# Patient Record
Sex: Male | Born: 1955 | Race: White | Hispanic: No | Marital: Married | Smoking: Never smoker
Health system: Southern US, Community
[De-identification: ages and names within clinical notes are randomized; demographics above are authoritative.]

## PROBLEM LIST (undated history)

## (undated) DIAGNOSIS — I209 Angina pectoris, unspecified: Secondary | ICD-10-CM

## (undated) DIAGNOSIS — F439 Reaction to severe stress, unspecified: Secondary | ICD-10-CM

## (undated) DIAGNOSIS — G8929 Other chronic pain: Secondary | ICD-10-CM

## (undated) DIAGNOSIS — K219 Gastro-esophageal reflux disease without esophagitis: Secondary | ICD-10-CM

## (undated) DIAGNOSIS — R569 Unspecified convulsions: Principal | ICD-10-CM

## (undated) DIAGNOSIS — M545 Low back pain, unspecified: Secondary | ICD-10-CM

## (undated) DIAGNOSIS — A0472 Enterocolitis due to Clostridium difficile, not specified as recurrent: Secondary | ICD-10-CM

## (undated) DIAGNOSIS — T8859XA Other complications of anesthesia, initial encounter: Secondary | ICD-10-CM

## (undated) DIAGNOSIS — M1990 Unspecified osteoarthritis, unspecified site: Secondary | ICD-10-CM

## (undated) DIAGNOSIS — G2581 Restless legs syndrome: Secondary | ICD-10-CM

## (undated) DIAGNOSIS — T4145XA Adverse effect of unspecified anesthetic, initial encounter: Secondary | ICD-10-CM

## (undated) HISTORY — DX: Restless legs syndrome: G25.81

## (undated) HISTORY — PX: CARDIAC CATHETERIZATION: SHX172

## (undated) HISTORY — DX: Unspecified convulsions: R56.9

---

## 1988-11-19 HISTORY — PX: MULTIPLE TOOTH EXTRACTIONS: SHX2053

## 1997-08-07 ENCOUNTER — Emergency Department (HOSPITAL_COMMUNITY): Admission: EM | Admit: 1997-08-07 | Discharge: 1997-08-07 | Payer: Self-pay | Admitting: Emergency Medicine

## 2001-02-05 ENCOUNTER — Emergency Department (HOSPITAL_COMMUNITY): Admission: EM | Admit: 2001-02-05 | Discharge: 2001-02-05 | Payer: Self-pay | Admitting: Emergency Medicine

## 2001-02-05 ENCOUNTER — Encounter: Payer: Self-pay | Admitting: Emergency Medicine

## 2007-02-22 ENCOUNTER — Observation Stay (HOSPITAL_COMMUNITY): Admission: EM | Admit: 2007-02-22 | Discharge: 2007-02-23 | Payer: Self-pay | Admitting: Emergency Medicine

## 2008-02-14 ENCOUNTER — Ambulatory Visit: Payer: Self-pay | Admitting: Psychiatry

## 2008-02-14 ENCOUNTER — Emergency Department (HOSPITAL_COMMUNITY): Admission: EM | Admit: 2008-02-14 | Discharge: 2008-02-14 | Payer: Self-pay | Admitting: Emergency Medicine

## 2008-02-14 ENCOUNTER — Inpatient Hospital Stay (HOSPITAL_COMMUNITY): Admission: EM | Admit: 2008-02-14 | Discharge: 2008-02-19 | Payer: Self-pay | Admitting: Psychiatry

## 2008-02-20 ENCOUNTER — Other Ambulatory Visit (HOSPITAL_COMMUNITY): Admission: RE | Admit: 2008-02-20 | Discharge: 2008-03-05 | Payer: Self-pay | Admitting: Psychiatry

## 2008-02-28 ENCOUNTER — Ambulatory Visit: Payer: Self-pay | Admitting: Psychiatry

## 2008-03-05 ENCOUNTER — Emergency Department (HOSPITAL_COMMUNITY): Admission: EM | Admit: 2008-03-05 | Discharge: 2008-03-05 | Payer: Self-pay | Admitting: Emergency Medicine

## 2008-03-11 ENCOUNTER — Inpatient Hospital Stay (HOSPITAL_COMMUNITY): Admission: EM | Admit: 2008-03-11 | Discharge: 2008-03-12 | Payer: Self-pay | Admitting: Emergency Medicine

## 2008-03-18 ENCOUNTER — Emergency Department (HOSPITAL_COMMUNITY): Admission: EM | Admit: 2008-03-18 | Discharge: 2008-03-18 | Payer: Self-pay | Admitting: Family Medicine

## 2008-04-08 ENCOUNTER — Ambulatory Visit (HOSPITAL_COMMUNITY): Payer: Self-pay | Admitting: Psychiatry

## 2008-05-13 ENCOUNTER — Ambulatory Visit (HOSPITAL_COMMUNITY): Payer: Self-pay | Admitting: Psychiatry

## 2008-07-02 ENCOUNTER — Ambulatory Visit (HOSPITAL_COMMUNITY): Payer: Self-pay | Admitting: Psychiatry

## 2010-08-03 NOTE — Discharge Summary (Signed)
Walter Holder, Walter Holder NO.:  [id number]   MEDICAL RECORD NO.:  [id number]          PATIENT TYPE:  INP   LOCATION:  3731                         FACILITY:  [facility]   PHYSICIAN:  Sheliah Mends, MD      DATE OF BIRTH:  1955/10/26   DATE OF ADMISSION:  03/11/2008  DATE OF DISCHARGE:  03/12/2008                               DISCHARGE SUMMARY   DISCHARGE DIAGNOSES:  1. Chest pain not coronary ischemia status post cardiac      catheterization this admission with essentially normal coronary      arteries.  He had only had like a 10-20% ostial diagonal 1      stenosis, his left circumflex was normal, right coronary artery was      normal, left anterior descending was normal and his left main was      normal.  Cardiac catheterization was performed by Dr. Sheliah Mends      and Dr. Nanetta Batty on March 12, 2008.  2. Anxiety/depression.  3. Recent detoxification for Xanax use.  4. Positive premature family history of coronary artery disease.  5. History of back pain.  6. Dyslipidemia.   LABORATORY DATA:  Chest x-ray shows suboptimal inspiration, no active  disease.  Lipid profile; total cholesterol 161, triglycerides 168, HDL  30, LDL 97.  CBC; hemoglobin 14.5, hematocrit of 42.5, WBCs 10.8 and  platelets 215.  TSH was 0.672.  CK-MBs and troponins were all negative.  Magnesium was 1.9.  Drug screen was negative.   DISCHARGE MEDICATIONS:  1. Trazodone 100 mg nightly.  2. Seroquel 100 mg nightly.  3. Pepcid 20 mg twice per day.  He can buy that over-the-counter and      he should take it for 2 weeks and then take 1 daily.  4. Aspirin 81 mg once a day.   He was also told that he did not need to return to a cardiologist now  with his essentially normal coronary arteries, he can follow up with his  primary care doctor if he has more chest pain.  He should call our  office if he has any problems with his groin.  He was told not to do any  strenuous activity of pushing,  pulling, lifting or extended walking for  1 week.  He was told no driving for 1 day.  He should drink plenty of  fluids today and tomorrow.  He should increase his fish, vegetables and  fiber and decrease his beef, cheese  and simple carbohydrates because of  his lipid profile.   HOSPITAL COURSE:  Walter Holder is a 55 year old male with no prior history  of coronary artery disease who developed some chest pain, dizziness,  pressure 5/10 went to his back and his left arm felt numb.  He had some  shortness of breath, diaphoresis but no nausea. He had never had a  similar episode before.  He was subsequently admitted and ruled out for  an MI. Walter Holder was scheduled for cardiac catheterization.  He does  have premature family history of coronary artery disease and  dyslipidemia.  Cardiac catheterization was done on March 12, 2008 by  Dr. Sheliah Mends and Dr. Nanetta Batty.  He had essentially normal  coronary arteries with only diagonal 1 that was 10-20%.  He had normal  LV function.  On admission, he was placed on lisinopril and Lopressor.  His blood  pressure was about 100 mmHg systolic and dropped down to 89-87.  He was  given an extra 500 mg bolus of fluid.  At this time, his discharge is  pending and getting up out of bed post cath.  If he walks in the halls,  if his blood pressure does not drop and he remains asymptomatic, he may  be discharged home.      Lezlie Octave, N.P.      Sheliah Mends, MD  Electronically Signed    BB/MEDQ  D:  03/12/2008  T:  03/13/2008  Job:  [id number]   cc:   Barry Dienes. Eloise Harman, M.D.  Geoffery Lyons, M.D.

## 2010-08-03 NOTE — H&P (Signed)
NAMEEOIN, WILLDEN NO.:  [id number]   MEDICAL RECORD NO.:  [id number]          PATIENT TYPE:  INP   LOCATION:  1317                         FACILITY:  [facility]   PHYSICIAN:  Tera Mater. [place], M.D. DATE OF BIRTH:  [birth year]   DATE OF ADMISSION:  02/22/2007  DATE OF DISCHARGE:                              HISTORY & PHYSICAL   Mr. Powell is a 55 year old healthy white male with a history of kidney  stones, colon polyps, and basically nothing else who presents for  evaluation.  Roughly two days prior to admission he started feeling  malaise and churning in his stomach.  He had one episode of vomiting,  but then this was followed by 8-10 bowel movements that were loose,  watery, and quite explosive.  He notices there was some darkness in  these stools, but saw no frank blood.  He had minor abdominal cramping.  He had weakness in his legs.  He had dizziness when he stood up and  basically presented to the emergency room for uncontrollable symptoms.  He did have hot and cold episodes but no measured fever.  His p.o.  intake the day prior to admission was fairly minimal.  He has had no  episodes like this.  No one around him has been sick.  He has had no  unusual travel.  His exposures, however, include eating a lot of wild  game.  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  He also has recently killed some  deer, and does eat wild game fairly regularly.  He also reports intake  of some cold dressing and a BLT at a friend's house in the last week.  He has not been out of the country.  He has not had any camping exposure  to unpurified water.  No one in the family has been sick.  He has had  nothing like this before.   REVIEW OF SYSTEMS:  GENERAL:  He has felt well.  His weight has been  stable.  His bowel habits have been normal.  Prior to this he has had no  abdominal complaints or other difficulties.   PAST MEDICAL  HISTORY:  Kidney stones.  He has had colon polyps with a  colonoscopy in 2008 in June.  He has had a back fracture and lumbar  puncture associated with that apparently.   MEDICATIONS:  Only some over-the-counter Tylenol and fish oil.   FAMILY HISTORY:  Negative for any bowel cancers or inflammatory bowel  disease.   SOCIAL HISTORY:  He is married.  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   PHYSICAL EXAMINATION:  GENERAL:  A fairly healthy-appearing white male  sitting up in no distress.  VITAL SIGNS:  Initial blood pressure 126/71, pulse 102, respirations 20,  temperature 99.1, O2 sats 98%.  HEENT:  Sclerae anicteric.  Extraocular movements intact.  Face is  symmetrical.  Mucous membranes are moist.  There are no oral lesions.  NECK:  Supple without JVD  or thyromegaly.  LUNGS:  Clear without wheezes, rales or rhonchi.  HEART:  Regular without murmurs, rubs or gallops.  ABDOMEN:  Mildly distended.  Does have some hyperactive bowel sounds.  No real rushes or tinkles are heard.  There is no area of tenderness,  pulsations or masses.  EXTREMITIES:  Reveal strong distal pulses without edema.  RECTAL:  Done in the ER did show heme positive stool.  It was dark  looking stool.  NEUROLOGICAL:  The patient is awake, alert.  His mentation is excellent.  Speech is clear.  No tremors present.  No weakness of note is present.   LABORATORY DATA:  Laboratory data at presentation:  White count 19,000,  hemoglobin 15.5, platelets 171,000, 79% neutrophils, 12% lymphocytes, 8%  monocytes, 0% eosinophils.  Sodium was 135, potassium 3.6, chloride 104,  CO2 23, BUN 15, creatinine 0.91, glucose 94.  Bilirubin 1.3.  SGOT 18,  SGPT 17.  Total protein 6.3.  Albumin 3.3.  Calcium 8.8.  This morning  white count 15,800, hemoglobin 13.2, platelets 144,000.  Liver function  testing is normal this morning.  Albumin has dropped to 2.8.  Sodium  136, potassium 3.3, chloride 105, CO2 25, BUN  10, creatinine 0.89,  glucose 98.   RADIOLOGY STUDIES:  Radiology studies of abdomen and pelvis.  Chest x-  ray showed left lung and left basilar subsegmental atelectasis.  Flat  and upright abdomen __________  colonic air-fluid levels without  evidence of destruction.  They were felt to be abnormal frequently  associated with infection and a few scattered small bowel levels are  noted.  No dilated loops of large or small bowel are noted.  This is  felt to be seen with gastroenteritis.   SUMMARY:  We have a 55 year old white male presenting with an acute GI  presentation with diarrhea and mildly heme-positive stools.  I do not  suspect there is any significant GI bleeding based on his hemodynamic  status as well as his stable hemoglobin.  I suspect more likely he has  some irritation from the multiple diarrheal episodes.  The acute onset  of this is most likely viral, and what we have here is probably a self-  limited disease.  There is, however, possibility of Clostridium  difficile, although he does not have any of the routine risk factors for  this.  He has been treated with both vancomycin IV and Flagyl p.o.  He  is clinically less toxic and actually has had no bowel movements  overnight.  We are going to see how he does during the day with  advancing the diet.  Hopefully he can be discharged fairly promptly.  I  am pleased his white count is already cut in half.  I do not suspect  inflammatory bowel disease as a primary onset at this age.  I also do  not suspect any parasites or other unusual things related to his wild  game exposure.           ______________________________  Tera Mater. Evlyn Kanner, M.D.     SAS/MEDQ  D:  02/22/2007  T:  02/22/2007  Job:  [id number]

## 2010-08-03 NOTE — Cardiovascular Report (Signed)
NAMEBERTRAND, VOWELS NO.:  [id number]   MEDICAL RECORD NO.:  [id number]          PATIENT TYPE:  INP   LOCATION:  3731                         FACILITY:  [facility]   PHYSICIAN:  Sheliah Mends, MD      DATE OF BIRTH:  [birth year]   DATE OF PROCEDURE:  DATE OF DISCHARGE:  03/12/2008                            CARDIAC CATHETERIZATION   CARDIOLOGISTS:  Nanetta Batty, MD and Sheliah Mends, MD   INDICATION:  This is a 55 year old gentleman who presented with  recurrent episodes of chest pain responding to nitroglycerine.  The  patient was started on the ACS protocol including heparin,  nitroglycerin, beta-blocker, metoprolol, and Crestor.  His chest pain  subsequently improved and he agreed to proceed with cardiac  catheterization.   PROCEDURE:  After informed consent was obtained, the patient was brought  to the second floor Cardiac Cath Lab.  Access was obtained for the right  femoral artery using local anesthesia with 1% lidocaine.  The patient  was started on conscious sedation with Versed and fentanyl.  Selective  coronary angiography was performed using the Cordis Infiniti and 6  French catheters.   FINDINGS:  Selective coronary angiography revealed a medium-sized, large  caliber left main artery that bifurcates into the LAD and left  circumflex artery.  The left main artery is free of disease.  The LAD is  a medium caliber, large-sized vessel that wraps around the apex.  It  gives off a large, high first diagonal branch and a subsequent smaller  diagonal branch in the medium segment.  The LAD is free of  angiographically significant disease.  The first diagonal artery has a  mild, 20% lesion at the osteum without hemodynamic significance.  The  second diagonal branch was free of disease.  The left circumflex artery  is the large-sized vessel that gives off 2 small OM branches.  The left  circumflex artery is free of disease.   The right coronary artery disease  is the medium-sized vessel that gives  of PDA.  The RCA has no angiographically significant disease.   HEMODYNAMICS:  Aortic pressure 100/76, left ventricular pressure 104/3.   LV gram shows mildly reduced left ventricular function with an estimated  ejection fraction of 50%.   The procedure was completed without complications.   RECOMMENDATIONS:  Coronary angiography did not reveal angiographically  significant coronary artery disease.  The patient was found to have mild  coronary artery disease in the first diagonal branch.  He should be  continued on aggressive risk factor modification.      Sheliah Mends, MD  Electronically Signed     JE/MEDQ  D:  03/12/2008  T:  03/12/2008  Job:  [id number]   cc:   Nanetta Batty, M.D.

## 2010-08-06 NOTE — Discharge Summary (Signed)
NAMEMCIHAEL, HINDERMAN NO.:  [id number]   MEDICAL RECORD NO.:  [id number]          PATIENT TYPE:  IPS   LOCATION:  0300                          FACILITY:  [facility]   PHYSICIAN:  Geoffery Lyons, M.D.      DATE OF BIRTH:  27-Feb-1956   DATE OF ADMISSION:  02/14/2008  DATE OF DISCHARGE:  02/19/2008                               DISCHARGE SUMMARY   CHIEF COMPLAINT AND PRESENT ILLNESS:  This was the first admission to  [facility] for this 55 year old white male voluntarily  admitted.  Endorsed he could not sleep, most nights gets an hour, and  then he keeps waking up, sometimes can get up to 3 hours.  Sleep has  been very poor for the last 10 days, worried about his thinking, has  been on opiates since falling and suffering a back injury, had been on  Xanax taking 2 mg daily, Percocet 4 tabs daily.   PAST PSYCHIATRIC HISTORY:  First time at inpatient.  No previous  treatment.   ALCOHOL AND DRUG HISTORY:  Taking Xanax and Percocet as prescribed.   PHYSICAL EXAM:  Failed to show any acute findings.   LABORATORY WORKUP:  UDS positive for benzodiazepines.  CBC and blood  chemistry within normal limits.   MENTAL STATUS EXAM:  Reveals an alert, cooperative male.  Mood  depressed.  Affect depressed.  Thought processes logical, coherent, and  relevant.  Endorsed feeling pretty overwhelmed and not able to sleep,  anxious, requesting help.   ADMITTING DIAGNOSES:  AXIS I:  1. Rule out benzodiazepine opiate dependence.  2. Depressive disorder, not otherwise specified.  AXIS II:  No diagnosis.  AXIS III:  Chronic back pain.  AXIS IV:  Moderate.  AXIS V:  Upon admission 35 to 40, highest Global Assessment of  Functioning in the last year 60.   COURSE IN THE HOSPITAL:  He was admitted.  We started detoxing with  clonidine and Librium.  He was given some Seroquel and trazodone.  As  already stated, 55 year old male married who endorsed that 6 years ago  he fell  and crushed his back.  He was placed on opiates, was trying to  get off pain pills as he is buying them off the street, usually Percocet  10 up to 4, sometimes more.  When he is not using the Percocet, he is  using the Xanax.  He was prescribed 0.5.  He used up to 4 a day, last  used Tuesday, some withdrawal, decreased sleep, aches, worries, feeling  out of control.  Afraid that he would hurt himself.  Denies depression,  mostly anxiety.  February 16, 2008, he was better.  Sleep started  improving.  Able to open up and talk about the conflicted relationship  with his daughter, start working on coping skills.  February 18, 2008,  endorsed he started to sleep better.  Worried about the stressor when he  got out of the hospital, his ability to sleep without the  benzodiazepines or the opiates.  He had some sweating, increased heart  rate, willing to  put up with this if the medications were going to help  him.  We continued the trazodone and the Seroquel with coping skills.  There was a family session February 19, 2008.  Wife talked about her own  stress in terms of [place], felt  that their roles have reverted.  The husband is self-employed so he  works at home, mostly does house work.  They identified the daughter as  being a big part of the stress.  They were planning to set some limits  on her.  The wife felt that the daughter was bipolar.  February 19, 2008,  he was in full contact with reality.  There were no active suicidal or  homicidal ideas.  No hallucinations or delusions.  He was willing and  motivated to pursue outpatient treatment and continued to abstain from  these substances.   DISCHARGE DIAGNOSES:  AXIS I:  1. Benzodiazepine dependence.  2. Opiate dependence.  3. Anxiety disorder, not otherwise specified.  AXIS II:  No diagnosis.  AXIS III:  Chronic back pain.  AXIS IV:  Moderate.  AXIS V:  Upon discharge 50 to 55.   Discharged on:   1. Seroquel 100 at bedtime.  2. Trazodone 100 at bedtime.   FOLLOWUP:  [facility].      Geoffery Lyons, M.D.  Electronically Signed     IL/MEDQ  D:  03/19/2008  T:  03/20/2008  Job:  [id number]

## 2010-08-06 NOTE — Discharge Summary (Signed)
Walter Holder, Walter Holder NO.:  [id number]   MEDICAL RECORD NO.:  [id number]          PATIENT TYPE:  INP   LOCATION:  1317                         FACILITY:  [facility]   PHYSICIAN:  Barry Dienes. Eloise Harman, M.D.DATE OF BIRTH:  Nov 16, 1955   DATE OF ADMISSION:  02/21/2007  DATE OF DISCHARGE:  02/23/2007                               DISCHARGE SUMMARY   PERTINENT FINDINGS:  The patient is a 55 year old healthy white man with  a past medical history significant for kidney stones, colon polyps, who  presented with two days of malaise with one episode of vomiting and  moderately severe diarrhea with 8-10 brown bowel movements per day.  Some of the bowel movements were black in color.  He denied nausea or  gross rectal bleeding.  He had mild lightheadedness when standing and  denied significant fever.  Approximately 3 weeks prior to admission he  had antibiotics with dental work.  He hunts and has eaten wild game  recently.  He has not had any recent food outside at home.   INITIAL PHYSICAL EXAMINATION:  GENERAL:  He is a healthy-appearing white  male who is sitting up in no apparent distress.  VITAL SIGNS:  Blood pressure 126/71, pulse 102, respirations 20,  temperature 99.1, pulse oxygen saturation 98% on room air.  HEENT:  Exam was within normal limits.  NECK:  Supple without jugular venous distention or carotid bruit.  CHEST:  Clear to auscultation.  HEART:  Regular rate and rhythm without significant murmur or gallop.  ABDOMEN:  Mildly distended.  Bowel sounds were hypoactive, and there  were no areas of tenderness and no hepatosplenomegaly.  EXTREMITIES:  Without cyanosis, clubbing, or edema, and the pedal pulses  were normal.  RECTAL:  Exam done by emergency room staff showed heme-positive stool.  NEUROLOGIC:  He is alert and well oriented with no focal deficits.   INITIAL LABORATORY STUDIES:  White blood cells 19 with 79% neutrophils,  12% lymphocytes, 8% monocytes and 0%  eosinophils, hemoglobin 15.5,  platelets 171. Serum sodium 135, potassium 3.6, chloride 104, carbon  dioxide 23, BUN 15, creatinine 0.91, glucose 94, total bilirubin 1.3,  SGOT 18, SGPT 17, total protein 6.3, albumin 3.3, calcium 8.8.   Acute abdominal series x-ray showed the following.  1. No acute cardiopulmonary disease with scattered subsegmental      atelectasis.  2. Nonobstructive bowel gas pattern with scattered large-bowel and      small-bowel air-fluid levels, commonly seen with gastroenteritis.      There were no signs of obstruction   HOSPITAL COURSE:  The patient was admitted to a medical bed without  telemetry.  He was treated with high-dose IV fluids and was started on  Flagyl 500 mg 3 times a day for presumed C.  difficile colitis.  One  stool specimen for C. difficile toxin returned negative.  He rapidly  improved, and by the morning of discharge had no nausea or vomiting and  had only had one loose bowel movement that was brown in color.   CONDITION ON DISCHARGE:  He was eating well with  a lessening of  frequency of diarrhea episodes.  His vital signs at the time of  discharge included blood pressure 122/69, pulse 116, respirations 20,  temperature 99.5.  Chest was clear to auscultation.  Heart had a regular  rate and rhythm.  Abdomen had normal bowel sounds and no tenderness.  Serum sodium was 140, potassium 4.3, BUN 5, creatinine 0.8, glucose 109.  White blood cells 16.9, hemoglobin 13, hematocrit 39, platelets 149.  Stool cultures for enteric pathogens were negative to date.   DISCHARGE DIAGNOSES:  1. Severe diarrhea, possibly due to Clostridium difficile colitis.  2. Dehydration.  3. History of nephrolithiasis.  4. History of colon polyps   DISCHARGE MEDICATIONS:  1. Flagyl 500 mg 1 tablet p.o. 3 times a day for 10 days.  2. Tylenol 500 mg p.o. 4 times a day p.r.n. pain.  3. Gator Ade or Pedialyte, take at least 2 liters daily until the      diarrhea  resolves entirely.  4. Phenergan 25 mg p.o. 3 times a day p.r.n. nausea.   DISPOSITION AND FOLLOWUP:  The patient will was advised to have a  followup evaluation with Dr. Jarome Matin at [facility] 1-2 weeks following discharge.  He was advised to call if his  diarrhea worsens or vomiting occurs.  He was also advised to not have  any alcoholic beverages whatsoever while on Flagyl due to risk of severe  nausea and vomiting.           ______________________________  Barry Dienes. Eloise Harman, M.D.     DGP/MEDQ  D:  02/26/2007  T:  02/26/2007  Job:  [id number]

## 2010-12-21 LAB — BASIC METABOLIC PANEL
Calcium: 9.2
GFR calc non Af Amer: >60
Glucose, Bld: 108 — ABNORMAL HIGH
Potassium: 3.5
Sodium: 136

## 2010-12-21 LAB — URINALYSIS, ROUTINE W REFLEX MICROSCOPIC
Bilirubin Urine: NEGATIVE
Glucose, UA: NEGATIVE
Specific Gravity, Urine: 1.009
pH: 7

## 2010-12-21 LAB — HEPATIC FUNCTION PANEL
AST: 17
Albumin: 4
Alkaline Phosphatase: 49
Total Protein: 7

## 2010-12-21 LAB — RAPID URINE DRUG SCREEN, HOSP PERFORMED
Cocaine: NOT DETECTED
Opiates: NOT DETECTED

## 2010-12-21 LAB — POCT I-STAT, CHEM 8
BUN: 8
Creatinine, Ser: 0.9
Potassium: 3.6
Sodium: 140

## 2010-12-21 LAB — DIFFERENTIAL
Basophils Absolute: 0
Basophils Relative: 0
Lymphocytes Relative: 14
Monocytes Relative: 5
Neutro Abs: 7.9 — ABNORMAL HIGH
Neutrophils Relative %: 81 — ABNORMAL HIGH

## 2010-12-21 LAB — URINE MICROSCOPIC-ADD ON

## 2010-12-21 LAB — ACETAMINOPHEN LEVEL

## 2010-12-21 LAB — CBC
MCHC: 34.1
RBC: 5
WBC: 9.7

## 2010-12-21 LAB — ETHANOL

## 2010-12-24 LAB — DIFFERENTIAL
Basophils Relative: 0 % (ref 0–1)
Eosinophils Absolute: 0 10*3/uL (ref 0.0–0.7)
Monocytes Absolute: 0.8 10*3/uL (ref 0.1–1.0)
Monocytes Relative: 8 % (ref 3–12)

## 2010-12-24 LAB — COMPREHENSIVE METABOLIC PANEL
ALT: 23 U/L (ref 0–53)
AST: 18 U/L (ref 0–37)
Albumin: 3.9 g/dL (ref 3.5–5.2)
Alkaline Phosphatase: 62 U/L (ref 39–117)
BUN: 10 mg/dL (ref 6–23)
Calcium: 10.1 mg/dL (ref 8.4–10.5)
Calcium: 9.2 mg/dL (ref 8.4–10.5)
Chloride: 105 mEq/L (ref 96–112)
Creatinine, Ser: 0.81 mg/dL (ref 0.4–1.5)
GFR calc Af Amer: >60 mL/min (ref >60)
Glucose, Bld: 111 mg/dL — ABNORMAL HIGH (ref 70–99)
Potassium: 3.8 mEq/L (ref 3.5–5.1)
Sodium: 140 mEq/L (ref 135–145)
Total Protein: 7.2 g/dL (ref 6.0–8.3)

## 2010-12-24 LAB — URINE MICROSCOPIC-ADD ON

## 2010-12-24 LAB — URINALYSIS, ROUTINE W REFLEX MICROSCOPIC
Bilirubin Urine: NEGATIVE
Glucose, UA: NEGATIVE mg/dL
Glucose, UA: NEGATIVE mg/dL
Hgb urine dipstick: NEGATIVE
Ketones, ur: 15 mg/dL — AB
Specific Gravity, Urine: 1.015 (ref 1.005–1.030)
pH: 6 (ref 5.0–8.0)
pH: 6.5 (ref 5.0–8.0)

## 2010-12-24 LAB — URINE DRUGS OF ABUSE SCREEN W ALC, ROUTINE (REF LAB)
Barbiturate Quant, Ur: NEGATIVE
Benzodiazepines.: NEGATIVE
Marijuana Metabolite: NEGATIVE
Propoxyphene: NEGATIVE

## 2010-12-24 LAB — RAPID URINE DRUG SCREEN, HOSP PERFORMED
Barbiturates: NOT DETECTED
Benzodiazepines: NOT DETECTED
Benzodiazepines: NOT DETECTED
Cocaine: NOT DETECTED
Cocaine: NOT DETECTED
Opiates: NOT DETECTED

## 2010-12-24 LAB — APTT: aPTT: 29 seconds (ref 24–37)

## 2010-12-24 LAB — LIPID PANEL
HDL: 30 mg/dL — ABNORMAL LOW (ref >39)
Total CHOL/HDL Ratio: 5.4 RATIO
VLDL: 34 mg/dL (ref 0–40)

## 2010-12-24 LAB — CBC
HCT: 42.5 % (ref 39.0–52.0)
HCT: 49 % (ref 39.0–52.0)
Hemoglobin: 14.5 g/dL (ref 13.0–17.0)
Hemoglobin: 17 g/dL (ref 13.0–17.0)
MCHC: 34.1 g/dL (ref 30.0–36.0)
MCHC: 34.6 g/dL (ref 30.0–36.0)
MCV: 92 fL (ref 78.0–100.0)
MCV: 93.1 fL (ref 78.0–100.0)
RBC: 5.32 MIL/uL (ref 4.22–5.81)
RDW: 13.4 % (ref 11.5–15.5)

## 2010-12-24 LAB — POCT I-STAT, CHEM 8
BUN: 10 mg/dL (ref 6–23)
BUN: 6 mg/dL (ref 6–23)
Calcium, Ion: 1.15 mmol/L (ref 1.12–1.32)
Calcium, Ion: 1.16 mmol/L (ref 1.12–1.32)
Chloride: 103 meq/L (ref 96–112)
Creatinine, Ser: 0.8 mg/dL (ref 0.4–1.5)
Creatinine, Ser: 0.9 mg/dL (ref 0.4–1.5)
Glucose, Bld: 103 mg/dL — ABNORMAL HIGH (ref 70–99)
HCT: 52 % (ref 39.0–52.0)
Hemoglobin: 17.7 g/dL — ABNORMAL HIGH (ref 13.0–17.0)
Potassium: 3.8 mEq/L (ref 3.5–5.1)
Sodium: 139 meq/L (ref 135–145)
TCO2: 25 mmol/L (ref 0–100)
TCO2: 26 mmol/L (ref 0–100)

## 2010-12-24 LAB — POCT CARDIAC MARKERS
CKMB, poc: 1 ng/mL (ref 1.0–8.0)
Myoglobin, poc: 92.2 ng/mL (ref 12–200)
Troponin i, poc: <0.05 ng/mL (ref 0.00–0.09)

## 2010-12-24 LAB — PROTIME-INR: INR: 1 (ref 0.00–1.49)

## 2010-12-24 LAB — CK TOTAL AND CKMB (NOT AT ARMC)
CK, MB: 0.8 ng/mL (ref 0.3–4.0)
Relative Index: INVALID (ref 0.0–2.5)

## 2010-12-24 LAB — TSH: TSH: 0.672 u[IU]/mL (ref 0.350–4.500)

## 2010-12-24 LAB — LIPASE, BLOOD: Lipase: 26 U/L (ref 11–59)

## 2010-12-24 LAB — HEPARIN LEVEL (UNFRACTIONATED): Heparin Unfractionated: 0.36 IU/mL (ref 0.30–0.70)

## 2010-12-27 LAB — DIFFERENTIAL
Eosinophils Absolute: 0 — ABNORMAL LOW
Eosinophils Relative: 0
Lymphs Abs: 2.3
Monocytes Relative: 8

## 2010-12-27 LAB — BASIC METABOLIC PANEL
BUN: 10
BUN: 5 — ABNORMAL LOW
CO2: 25
CO2: 25
CO2: 27
Calcium: 8.8
Calcium: 9
Chloride: 105
Chloride: 107
Creatinine, Ser: 0.89
GFR calc Af Amer: >60
GFR calc non Af Amer: >60
Glucose, Bld: 107 — ABNORMAL HIGH
Glucose, Bld: 109 — ABNORMAL HIGH
Glucose, Bld: 98
Potassium: 3.8
Sodium: 140
Sodium: 140

## 2010-12-27 LAB — COMPREHENSIVE METABOLIC PANEL
ALT: 17
AST: 18
CO2: 23
Calcium: 8.8
Chloride: 104
GFR calc Af Amer: >60
GFR calc non Af Amer: >60
Sodium: 135

## 2010-12-27 LAB — CBC
HCT: 38.3 — ABNORMAL LOW
HCT: 38.9 — ABNORMAL LOW
Hemoglobin: 13.4
Hemoglobin: 13.7
MCHC: 34.9
MCHC: 35.2
MCHC: 35.3
MCHC: 35.5
MCV: 89.4
MCV: 89.9
Platelets: 144 — ABNORMAL LOW
Platelets: 149 — ABNORMAL LOW
RBC: 4.26
RBC: 4.9
RDW: 13.5
RDW: 13.8
WBC: 19 — ABNORMAL HIGH

## 2010-12-27 LAB — HEPATIC FUNCTION PANEL
AST: 14
Albumin: 2.8 — ABNORMAL LOW
Total Bilirubin: 1.1
Total Protein: 5.4 — ABNORMAL LOW

## 2010-12-27 LAB — STOOL CULTURE

## 2010-12-27 LAB — CLOSTRIDIUM DIFFICILE EIA: C difficile Toxins A+B, EIA: NEGATIVE

## 2010-12-27 LAB — TYPE AND SCREEN: ABO/RH(D): O NEG

## 2012-02-28 ENCOUNTER — Emergency Department (HOSPITAL_COMMUNITY): Payer: Self-pay

## 2012-02-28 ENCOUNTER — Encounter (HOSPITAL_COMMUNITY): Payer: Self-pay | Admitting: *Deleted

## 2012-02-28 ENCOUNTER — Inpatient Hospital Stay (HOSPITAL_COMMUNITY)
Admission: EM | Admit: 2012-02-28 | Discharge: 2012-02-29 | DRG: 372 | Disposition: A | Discharge status: Home / Self Care | Payer: MEDICAID | Attending: Internal Medicine | Admitting: Internal Medicine

## 2012-02-28 DIAGNOSIS — R51 Headache: Secondary | ICD-10-CM | POA: Diagnosis present

## 2012-02-28 DIAGNOSIS — R000 Tachycardia, unspecified: Secondary | ICD-10-CM | POA: Diagnosis present

## 2012-02-28 DIAGNOSIS — A09 Infectious gastroenteritis and colitis, unspecified: Secondary | ICD-10-CM | POA: Diagnosis present

## 2012-02-28 DIAGNOSIS — M545 Low back pain, unspecified: Secondary | ICD-10-CM | POA: Diagnosis present

## 2012-02-28 DIAGNOSIS — E860 Dehydration: Secondary | ICD-10-CM | POA: Diagnosis present

## 2012-02-28 DIAGNOSIS — R195 Other fecal abnormalities: Secondary | ICD-10-CM | POA: Diagnosis present

## 2012-02-28 DIAGNOSIS — K921 Melena: Secondary | ICD-10-CM | POA: Diagnosis present

## 2012-02-28 DIAGNOSIS — K529 Noninfective gastroenteritis and colitis, unspecified: Secondary | ICD-10-CM

## 2012-02-28 DIAGNOSIS — E876 Hypokalemia: Secondary | ICD-10-CM | POA: Diagnosis present

## 2012-02-28 DIAGNOSIS — A0472 Enterocolitis due to Clostridium difficile, not specified as recurrent: Principal | ICD-10-CM | POA: Diagnosis present

## 2012-02-28 HISTORY — DX: Other chronic pain: G89.29

## 2012-02-28 HISTORY — DX: Gastro-esophageal reflux disease without esophagitis: K21.9

## 2012-02-28 HISTORY — DX: Other complications of anesthesia, initial encounter: T88.59XA

## 2012-02-28 HISTORY — DX: Low back pain: M54.5

## 2012-02-28 HISTORY — DX: Reaction to severe stress, unspecified: F43.9

## 2012-02-28 HISTORY — DX: Angina pectoris, unspecified: I20.9

## 2012-02-28 HISTORY — DX: Enterocolitis due to Clostridium difficile, not specified as recurrent: A04.72

## 2012-02-28 HISTORY — DX: Low back pain, unspecified: M54.50

## 2012-02-28 HISTORY — DX: Adverse effect of unspecified anesthetic, initial encounter: T41.45XA

## 2012-02-28 HISTORY — DX: Unspecified osteoarthritis, unspecified site: M19.90

## 2012-02-28 LAB — CBC WITH DIFFERENTIAL/PLATELET
Basophils Relative: 0 % (ref 0–1)
Eosinophils Absolute: 0 10*3/uL (ref 0.0–0.7)
Eosinophils Absolute: 0 10*3/uL (ref 0.0–0.7)
Eosinophils Relative: 0 % (ref 0–5)
HCT: 37.6 % — ABNORMAL LOW (ref 39.0–52.0)
Hemoglobin: 15 g/dL (ref 13.0–17.0)
Hemoglobin: 12.7 g/dL — ABNORMAL LOW (ref 13.0–17.0)
Lymphs Abs: 1.6 10*3/uL (ref 0.7–4.0)
MCH: 30.5 pg (ref 26.0–34.0)
MCH: 29.7 pg (ref 26.0–34.0)
MCHC: 34.6 g/dL (ref 30.0–36.0)
MCV: 87.9 fL (ref 78.0–100.0)
Monocytes Absolute: 1.6 10*3/uL — ABNORMAL HIGH (ref 0.1–1.0)
Monocytes Relative: 9 % (ref 3–12)
Neutro Abs: 13.3 10*3/uL — ABNORMAL HIGH (ref 1.7–7.7)
Neutrophils Relative %: 84 % — ABNORMAL HIGH (ref 43–77)
Neutrophils Relative %: 82 % — ABNORMAL HIGH (ref 43–77)
Platelets: 215 10*3/uL (ref 150–400)
RBC: 4.91 MIL/uL (ref 4.22–5.81)
RBC: 4.28 MIL/uL (ref 4.22–5.81)

## 2012-02-28 LAB — COMPREHENSIVE METABOLIC PANEL
ALT: 20 U/L (ref 0–53)
AST: 23 U/L (ref 0–37)
Albumin: 3.3 g/dL — ABNORMAL LOW (ref 3.5–5.2)
Alkaline Phosphatase: 57 U/L (ref 39–117)
Chloride: 102 mEq/L (ref 96–112)
Potassium: 4.1 mEq/L (ref 3.5–5.1)
Sodium: 137 mEq/L (ref 135–145)
Total Bilirubin: 0.5 mg/dL (ref 0.3–1.2)
Total Protein: 6.9 g/dL (ref 6.0–8.3)

## 2012-02-28 LAB — CLOSTRIDIUM DIFFICILE BY PCR: Toxigenic C. Difficile by PCR: POSITIVE — AB

## 2012-02-28 MED ORDER — MORPHINE SULFATE 4 MG/ML IJ SOLN
4.0000 mg | Freq: Once | INTRAMUSCULAR | Status: AC
  Administered 2012-02-28: 4 mg via INTRAVENOUS
  Filled 2012-02-28: qty 1

## 2012-02-28 MED ORDER — ACETAMINOPHEN 650 MG RE SUPP: 650.0000 mg | Freq: Four times a day (QID) | RECTAL | Status: DC | PRN

## 2012-02-28 MED ORDER — IOHEXOL 300 MG/ML  SOLN
20.0000 mL | INTRAMUSCULAR | Status: AC
  Administered 2012-02-28: 20 mL via ORAL

## 2012-02-28 MED ORDER — SODIUM CHLORIDE 0.9 % IV BOLUS (SEPSIS)
1000.0000 mL | Freq: Once | INTRAVENOUS | Status: AC
  Administered 2012-02-28: 1000 mL via INTRAVENOUS

## 2012-02-28 MED ORDER — HYDROCODONE-ACETAMINOPHEN 5-325 MG PO TABS
1.0000 | ORAL_TABLET | Freq: Four times a day (QID) | ORAL | Status: DC | PRN
  Administered 2012-02-28 – 2012-02-29 (×2): 1 via ORAL
  Filled 2012-02-28 (×2): qty 1

## 2012-02-28 MED ORDER — ONDANSETRON HCL 4 MG/2ML IJ SOLN: 4.0000 mg | Freq: Four times a day (QID) | INTRAMUSCULAR | Status: DC | PRN

## 2012-02-28 MED ORDER — MORPHINE SULFATE 2 MG/ML IJ SOLN
2.0000 mg | INTRAMUSCULAR | Status: DC | PRN
  Administered 2012-02-29: 2 mg via INTRAVENOUS
  Filled 2012-02-28: qty 1

## 2012-02-28 MED ORDER — PANTOPRAZOLE SODIUM 40 MG IV SOLR
40.0000 mg | Freq: Two times a day (BID) | INTRAVENOUS | Status: DC
  Administered 2012-02-28: 40 mg via INTRAVENOUS
  Filled 2012-02-28 (×3): qty 40

## 2012-02-28 MED ORDER — DICYCLOMINE HCL 10 MG/ML IM SOLN
20.0000 mg | Freq: Once | INTRAMUSCULAR | Status: AC
  Administered 2012-02-28: 20 mg via INTRAMUSCULAR
  Filled 2012-02-28: qty 2

## 2012-02-28 MED ORDER — ONDANSETRON HCL 4 MG/2ML IJ SOLN
4.0000 mg | Freq: Once | INTRAMUSCULAR | Status: AC
  Administered 2012-02-28: 4 mg via INTRAVENOUS
  Filled 2012-02-28: qty 2

## 2012-02-28 MED ORDER — METRONIDAZOLE 500 MG PO TABS
500.0000 mg | ORAL_TABLET | Freq: Three times a day (TID) | ORAL | Status: DC
  Administered 2012-02-28 – 2012-02-29 (×4): 500 mg via ORAL
  Filled 2012-02-28 (×6): qty 1

## 2012-02-28 MED ORDER — BISMUTH SUBSALICYLATE 262 MG/15ML PO SUSP
30.0000 mL | ORAL | Status: DC | PRN
  Administered 2012-02-28: 30 mL via ORAL
  Filled 2012-02-28: qty 236

## 2012-02-28 MED ORDER — PROMETHAZINE HCL 25 MG/ML IJ SOLN
25.0000 mg | Freq: Once | INTRAMUSCULAR | Status: AC
  Administered 2012-02-28: 25 mg via INTRAVENOUS
  Filled 2012-02-28: qty 1

## 2012-02-28 MED ORDER — ACETAMINOPHEN 325 MG PO TABS: 650.0000 mg | ORAL_TABLET | Freq: Four times a day (QID) | ORAL | Status: DC | PRN

## 2012-02-28 MED ORDER — SODIUM CHLORIDE 0.9 % IV SOLN
INTRAVENOUS | Status: DC
  Administered 2012-02-28: 18:00:00 via INTRAVENOUS

## 2012-02-28 MED ORDER — METHOCARBAMOL 100 MG/ML IJ SOLN
500.0000 mg | Freq: Three times a day (TID) | INTRAVENOUS | Status: DC | PRN
  Administered 2012-02-28: 500 mg via INTRAVENOUS
  Filled 2012-02-28 (×3): qty 5

## 2012-02-28 MED ORDER — IOHEXOL 300 MG/ML  SOLN
100.0000 mL | Freq: Once | INTRAMUSCULAR | Status: AC | PRN
  Administered 2012-02-28: 100 mL via INTRAVENOUS

## 2012-02-28 NOTE — Progress Notes (Signed)
[name] [id number]  Walter Holder is a 56 y.o. male patient admitted from ED awake, alert  & orientated  X 3, no distress noted.  VSS - Blood pressure 109/67, pulse 113, temperature 99.4 F (37.4 C), temperature source Oral, resp. rate 18, SpO2 97.00%.  no c/o shortness of breath, no c/o chest pain. IV in place with a transparent dsg that's clean dry and intact without redness.  Pt orientation to unit, room and routine. Information packet given to patient/family and safety video watched. Visitor Guide line reviewed  Admission INP armband ID verified with patient/family, and in place. SR up x 2, fall risk assessment complete with Patient and family verbalizing understanding of risks associated with falls. Pt verbalizes an understanding of how to use the call bell and to call for help before getting out of bed.  Skin, clean-dry- intact without evidence of bruising, or skin tears.  No evidence of skin break down noted on exam.   Will cont to eval and treat per MD orders.  Bing Quarry, RN 02/28/2012 7:41 PM

## 2012-02-28 NOTE — ED Notes (Signed)
Pt resting after medication. HR remains 120. No needs at this time. Son at bedside.

## 2012-02-28 NOTE — ED Provider Notes (Signed)
Pt's ct scan shows enteritis,  No diverticulitis, no obstruction.   Pt continues to have diarrhea,  Pt continues to be tachycardic after 2 liters of IV fluids,   Continued abdominal cramping,   Repeat cbc shows increased wbc's and decreased hemoglobin.   I spoke to Dr. Clelia Croft who will admit pt for further evaluation and treatment   I will add stool studies  Elson Areas, PA 02/28/12 [address] [place], [place] 02/28/12 1208

## 2012-02-28 NOTE — H&P (Signed)
PCP:   Sheila Oats, MD   Chief Complaint:  Nausea/vomitting diarrhea  HPI: Walter Holder is a 56 year old white male with a history of chronic back pain and prior hospitalization for severe diarrhea (possible C. difficile) who presented to the emergency department with the complaint of nausea, vomiting, diarrhea x3 days. Patient states these in his usual state of health until Sunday afternoon when he ate some younger. Walter Holder thereafter, he had onset of watery diarrhea. This is continued over the past 3 days and is now become very dark in color-he describes as black liquid stools. Overnight, he developed nausea and vomiting with multiple episodes. This prompted him to come to the emergency department for management. In the emergency department found to be tachycardic with a white blood count of 27,000. CT scan showed findings consistent with enteritis. Hemoccult was positive.  Despite hydration he continues to have profuse diarrhea and requires admission for management of dehydration. He denies any recent travel or sick contacts. No recent antibiotics. No other GI issues since his episode of diarrhea several years ago.  Review of Systems:  Review of Systems - Negative except Headache, brief fever/chills/sweats overnight, chronic back pain.  Past Medical History: Low back pain, T12 compression fracture Nephrolithiasis, left June 2008:  Colon polyps Hemorrhoids December 2008:  Hospitalization for severe diarrhea (possible C. diff) August 2012 right knee pain, given cortisone injection, repeat cortisone injection 08/08/11 5/13 Low Back Pain radiating from right buttock to right foot (right L5 radiculopathy?)  Physicians involved in care:  Dossie Arbour (PCP), Dr. Glendale Chard (chiropractor), Felecia Shelling (GI), Jodi Geralds (ortho)  Past Surgical History December 2009:  Cardiac catheterization Remote lumbar spine cortisone injections June 2008 colonoscopy showed internal and external hemorrhoids, 5 mm  sigmoid colon polyp (path results?)  Medications: Prior to Admission medications   Medication Sig Start Date End Date Taking? Authorizing Provider  alum & mag hydroxide-simeth (MAALOX/MYLANTA) 200-200-20 MG/5ML suspension Take 15 mLs by mouth every 6 (six) hours as needed. For stomach   Yes Historical Provider, MD  HYDROcodone-acetaminophen (NORCO/VICODIN) 5-325 MG per tablet Take 1 tablet by mouth every 6 (six) hours as needed. For pain   Yes Historical Provider, MD  Multiple Vitamin (MULTIVITAMIN WITH MINERALS) TABS Take 1 tablet by mouth daily.   Yes Historical Provider, MD  omeprazole (PRILOSEC) 20 MG capsule Take 20 mg by mouth daily as needed. For stomach   Yes Historical Provider, MD    Allergies:  No Known Allergies  Social History: Married with one daughter and one son. Has completed a high-school education.   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  Denies tobacco use.  Denies alcohol or drug use.  Exercise includes 3.5 hours a week of walking.  Family History: Father:  Unremarkable Mother:  History of osteoarthritis. One sister with fibromyalgia. One brother with hypertension and history of tobacco abuse. No family history of early coronary artery disease, diabetes mellitus, or colon cancer.  Physical Exam: Filed Vitals:   02/28/12 0730 02/28/12 0923 02/28/12 1016 02/28/12 1211  BP: 110/62 120/59 103/69 122/57  Pulse: 118 122 122 120  Temp:    100 F (37.8 C)  TempSrc:    Oral  Resp: 20 20 18 18   SpO2: 97% 96% 98% 100%   General appearance: fatigued and Appears uncomfortable Head: Normocephalic, without obvious abnormality, atraumatic Eyes: conjunctivae/corneas clear. PERRL, EOM's intact.  Nose: Nares normal. Septum midline. Oropharynx is dry without erythema. Throat: lips, mucosa, and tongue normal; teeth and gums normal Neck: no  adenopathy, no carotid bruit, no JVD and thyroid not enlarged, symmetric, no tenderness/mass/nodules Resp: clear  to auscultation bilaterally Cardio: Tachycardic without murmurs rubs or gallop GI: soft, nondistended with normoactive bowel sounds; diffuse tenderness without rebound or guarding Extremities: extremities normal, atraumatic, no cyanosis or edema Pulses: 2+ and symmetric Lymph nodes: Cervical adenopathy: no cervical lymphadenopathy Neurologic: Alert and oriented X 3, normal strength and tone. Normal symmetric reflexes.  Heme positive per emergency department physician    Labs on Admission:   [facility] 02/28/12 0337  NA 137  K 4.1  CL 102  CO2 24  GLUCOSE 109*  BUN 27*  CREATININE 0.81  CALCIUM 9.0  MG --  PHOS --    Basename 02/28/12 0337  AST 23  ALT 20  ALKPHOS 57  BILITOT 0.5  PROT 6.9  ALBUMIN 3.3*    Basename 02/28/12 0337  LIPASE 10*  AMYLASE --    Basename 02/28/12 0857 02/28/12 0337  WBC 18.2* 15.9*  NEUTROABS 15.0* 13.3*  HGB 12.7* 15.0  HCT 37.6* 43.4  MCV 87.9 88.4  PLT 189 215   No results found for this basename: CKTOTAL:3,CKMB:3,CKMBINDEX:3,TROPONINI:3 in the last 72 hours Lab Results  Component Value Date   INR 1.0 03/11/2008   No results found for this basename: TSH,T4TOTAL,FREET3,T3FREE,THYROIDAB in the last 72 hours No results found for this basename: VITAMINB12:2,FOLATE:2,FERRITIN:2,TIBC:2,IRON:2,RETICCTPCT:2 in the last 72 hours  Radiological Exams on Admission: Ct Abdomen Pelvis W Contrast  02/28/2012  *RADIOLOGY REPORT*  Clinical Data: Lower abdominal pain, vomiting, low grade fever  CT ABDOMEN AND PELVIS WITH CONTRAST  Technique:  Multidetector CT imaging of the abdomen and pelvis was performed following the standard protocol during bolus administration of intravenous contrast.  Contrast: OMNIPAQUE IOHEXOL 300 MG/ML  SOLN  Comparison: CT abdomen pelvis - 03/05/2008  Findings:  Normal hepatic contour.  Incidental note is made of two hypoattenuating sub centimeter lesions within the dome of the left lobe of the liver (image 13,  series 2) which are too small to accurately characterize.  Normal appearance of the gallbladder.  No definite intra or extrahepatic biliary ductal dilatation.  No ascites.  There is symmetric enhancement and excretion of the bilateral kidneys.  There are two adjacent punctate (approximately 2 mm) nonobstructing stones within the inferior pole of the left kidney (image 43) as well as an additional punctate nonobstructing stone in the mid aspect of the right kidney (image 32).  No evidence of urinary obstruction.  No stones are seen within the expected location of either ureter or the urinary bladder.  There are innumerable parapelvic cysts read demonstrated.  There is a minimal amount of symmetric likely age related perinephric stranding. Normal appearance of the bilateral adrenal glands, pancreas and spleen.  There is a moderate to large amount of liquid stool throughout the colon.  There is a moderate to large amount of fluid seen throughout the entirety of the small bowel.  This finding is associated with minimal bowel wall thickening and adjacent mesenteric stranding within several loops of distal small bowel within the right lower abdominal quadrant (images 65 and 69, series 2).  No evidence of enteric obstruction.  No pneumoperitoneum or pneumatosis.  No intra-abdominal abscess or fluid collection. Normal appearance of the appendix.  Normal caliber of the abdominal aorta.  The major branches of the abdominal aorta are widely patent.  Incidental note is made of a small circumaortic left sided renal vein.  No retroperitoneal, mesenteric, pelvic or inguinal lymphadenopathy.  The pelvic organs  are normal.  No free fluid in the pelvis.  Limited visualization of the lower thorax is negative for focal airspace opacity or pleural effusion.  Normal heart size.  No pericardial effusion.  No acute or aggressive osseous abnormalities.  Mildly accentuated lumbar lordosis.  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 No definite pars defects.  No anterolisthesis.  IMPRESSION:  1.  Fluid filled mildly dilated loops of small bowel with moderate to large amount of liquid stool throughout the colon without evidence of obstruction.  Minimal amount of nonspecific bowel wall thickening and mesenteric stranding adjacent to several loops of small bowel within the right lower abdominal quadrant. Constellation of findings are nonspecific but most suggestive of an enteritis. 2.  Bilateral punctate nonobstructing renal stones. 3.  Innumerable bilateral parapelvic cyst without evidence of urinary obstruction.   Original Report Authenticated By: Tacey Ruiz, MD     Assessment/Plan Principal Problem: 1. *Acute infective gastroenteritis- his presentation is most consistent with acute infectious nausea/vomiting/diarrhea, most likely viral. He has not had any recent antibiotics though he does report a remote history of C. Difficile. We'll obtain stool studies including culture, O&P, and C. Difficile.  We'll treat supportively with IV fluid hydration, antiemetics, and Kaopectate. Hold off on Imodium pending stool studies.  Also hold off on any antibiotics pending evaluation.  Active Problems: 2. Dehydration- he requires admission for IV fluid hydration given persistent diarrhea. 3. Tachycardia- secondary to dehydration. Monitor with hydration. 4. Heme positive stool- likely secondary to enteritis. If he is a significant decrease in his hemoglobin we'll consider GI evaluation. Otherwise, we'll continue to monitor. He had a colonoscopy in 2008. 5. Disposition-Anticipate discharge in one to 2 days depending on response to treatment.   Walter Holder,W DOUGLAS 02/28/2012, 1:08 PM

## 2012-02-28 NOTE — Progress Notes (Signed)
Patient is having some abdominal tenderness and is hesitant to take his IV morphine. States he would like to have phenergan for his abdominal cramps. RN states that she can call the provider on call to see if she could get an order for a muscle relaxer. Provider on call contacted to see if she could administer something IV since the patient is having some difficulty with this abdomen after eating a clear liquid diet. Robaxin IV was ordered and placed in the computer. Will administer as soon as it is ready from pharmacy. Will continue to monitor.    Walter Holder J. Lendell Caprice RN BSN

## 2012-02-28 NOTE — ED Notes (Signed)
Patient is currently drinking iohexol solution for pending CT Abd/Pelvis

## 2012-02-28 NOTE — ED Provider Notes (Signed)
History     CSN: [id number]  Arrival date & time 02/28/12  0241   First MD Initiated Contact with Patient 02/28/12 [phone]      Chief Complaint  Patient presents with  . Abdominal Pain    (Consider location/radiation/quality/duration/timing/severity/associated sxs/prior treatment) Patient is a 56 y.o. male presenting with abdominal pain. The history is provided by the patient.  Abdominal Pain The primary symptoms of the illness include abdominal pain, fatigue, nausea, vomiting and diarrhea. The primary symptoms of the illness do not include fever, shortness of breath or dysuria. The current episode started 2 days ago. The onset of the illness was sudden. The problem has been gradually worsening.  The abdominal pain is generalized. The abdominal pain does not radiate. The severity of the abdominal pain is 4/10. The abdominal pain is relieved by nothing. The abdominal pain is exacerbated by eating and bowel movements.  The vomiting began 2 days ago. Vomiting occurs 2 to 5 times per day. The emesis contains stomach contents (today vomitus started to look very dark).  The diarrhea is watery (black stool). The diarrhea occurs 5 to 10 times per day. Risk factors for illness producing diarrhea include suspect food intake.  The patient has had a change in bowel habit. Additional symptoms associated with the illness include chills and anorexia. Symptoms associated with the illness do not include urgency, frequency or back pain. Significant associated medical issues include GERD. Significant associated medical issues do not include PUD, inflammatory bowel disease, diabetes, gallstones or liver disease.    History reviewed. No pertinent past medical history.  History reviewed. No pertinent past surgical history.  No family history on file.  History  Substance Use Topics  . Smoking status: Never Smoker   . Smokeless tobacco: Not on file  . Alcohol Use: Yes      Review of Systems   Constitutional: Positive for chills and fatigue. Negative for fever.  Respiratory: Negative for shortness of breath.   Gastrointestinal: Positive for nausea, vomiting, abdominal pain, diarrhea and anorexia.  Genitourinary: Negative for dysuria, urgency and frequency.  Musculoskeletal: Negative for back pain.  All other systems reviewed and are negative.    Allergies  Review of patient's allergies indicates no known allergies.  Home Medications   Current Outpatient Rx  Name  Route  Sig  Dispense  Refill  . ALUM & MAG HYDROXIDE-SIMETH 200-200-20 MG/5ML PO SUSP   Oral   Take 15 mLs by mouth every 6 (six) hours as needed. For stomach         . HYDROCODONE-ACETAMINOPHEN 5-325 MG PO TABS   Oral   Take 1 tablet by mouth every 6 (six) hours as needed. For pain         . ADULT MULTIVITAMIN W/MINERALS CH   Oral   Take 1 tablet by mouth daily.         Marland Kitchen OMEPRAZOLE 20 MG PO CPDR   Oral   Take 20 mg by mouth daily as needed. For stomach           BP 129/79  Pulse 133  Temp 98.3 F (36.8 C) (Oral)  Resp 18  SpO2 96%  Physical Exam  Nursing note and vitals reviewed. Constitutional: He is oriented to person, place, and time. He appears well-developed and well-nourished. No distress.  HENT:  Head: Normocephalic and atraumatic.  Mouth/Throat: Oropharynx is clear and moist. Mucous membranes are dry.  Eyes: Conjunctivae normal and EOM are normal. Pupils are equal, round, and reactive to  light.  Neck: Normal range of motion. Neck supple.  Cardiovascular: Regular rhythm and intact distal pulses.  Tachycardia present.   No murmur heard. Pulmonary/Chest: Effort normal and breath sounds normal. No respiratory distress. He has no wheezes. He has no rales.  Abdominal: Soft. Normal appearance. He exhibits no distension. Bowel sounds are increased. There is generalized tenderness. There is no rebound, no guarding and no CVA tenderness.  Musculoskeletal: Normal range of motion. He  exhibits no edema and no tenderness.  Neurological: He is alert and oriented to person, place, and time.  Skin: Skin is warm and dry. No rash noted. No erythema. No pallor.  Psychiatric: He has a normal mood and affect. His behavior is normal.    ED Course  Procedures (including critical care time)  Labs Reviewed  CBC WITH DIFFERENTIAL - Abnormal; Notable for the following:    WBC 15.9 (*)     Neutrophils Relative 84 (*)     Neutro Abs 13.3 (*)     Lymphocytes Relative 6 (*)     Monocytes Absolute 1.6 (*)     All other components within normal limits  COMPREHENSIVE METABOLIC PANEL - Abnormal; Notable for the following:    Glucose, Bld 109 (*)     BUN 27 (*)     Albumin 3.3 (*)     All other components within normal limits  LIPASE, BLOOD - Abnormal; Notable for the following:    Lipase 10 (*)     All other components within normal limits   No results found.   No diagnosis found.    MDM   Patient with a 2 day history of vomiting and diarrhea. He states today his vomitus and diarrhea about extremely dark. No prior history of GI bleeding but does have a history of GERD. Nonsmoker and no heavy alcohol use. Denies fever, focal abdominal pain or syncope. He complains of diffuse body aches and generalized weakness. Unable to check a Hemoccult as it would be unreliable due to to severe persistent diarrhea. Patient does have a prior history of C. difficile but denies any recent antibiotic use. He states he may have been bad yogurt prior to symptoms starting.  Tachycardic year with no focal abdominal pain. Hyperactive bowel sounds. CBC, CMP, lipase pending. Patient given IV fluids and Zofran  4:24 AM Patient with a leukocytosis and elevated BUN but normal hemoglobin. On raising exam he is having lower abdominal pain and left lower quadrant pain. Will get a CT to further evaluate.      Gwyneth Sprout, MD 03/01/12 [phone]

## 2012-02-28 NOTE — ED Notes (Signed)
Pt appears to be improving. Skin warm and dry. Pt is alert, talking more. Reports feeling better. Has not had any diarrhea as of recent. Pt provided ginger ale to sip along with med. Waiting for floor to take report.

## 2012-02-28 NOTE — ED Notes (Signed)
Patient transported to CT 

## 2012-02-28 NOTE — ED Notes (Signed)
Aching all over his body and since yesterday he has been vomiting up black  Material with abd cramps

## 2012-02-28 NOTE — ED Notes (Signed)
Pt had episode of bowel incontinence. Pt went to sleep, woke up and had incontinence. Pt ambulated to bathroom to clean himself up. Provided new gown/paper scrubs. Full linen change.

## 2012-02-28 NOTE — ED Notes (Signed)
Pharmacy notified for phenergan  

## 2012-02-28 NOTE — ED Notes (Addendum)
In to see patient. Had episode of dark, very watery stool. Pt remains tachycardic, reports feeling some better. Sleeping, patient lethargic. Lab calling to reporting C Diff positive. Pt remains on enteric/contact precautions as he has been. Admitting MD notified.

## 2012-02-29 DIAGNOSIS — E876 Hypokalemia: Secondary | ICD-10-CM | POA: Diagnosis not present

## 2012-02-29 DIAGNOSIS — M545 Low back pain, unspecified: Secondary | ICD-10-CM | POA: Diagnosis present

## 2012-02-29 DIAGNOSIS — A0472 Enterocolitis due to Clostridium difficile, not specified as recurrent: Secondary | ICD-10-CM | POA: Diagnosis present

## 2012-02-29 LAB — BASIC METABOLIC PANEL
CO2: 26 mEq/L (ref 19–32)
Glucose, Bld: 93 mg/dL (ref 70–99)
Potassium: 3.3 mEq/L — ABNORMAL LOW (ref 3.5–5.1)
Sodium: 141 mEq/L (ref 135–145)

## 2012-02-29 LAB — CBC
Hemoglobin: 11.7 g/dL — ABNORMAL LOW (ref 13.0–17.0)
RBC: 3.92 MIL/uL — ABNORMAL LOW (ref 4.22–5.81)

## 2012-02-29 LAB — OVA AND PARASITE EXAMINATION

## 2012-02-29 MED ORDER — METRONIDAZOLE 500 MG PO TABS: 500.0000 mg | ORAL_TABLET | Freq: Three times a day (TID) | ORAL | Status: DC

## 2012-02-29 MED ORDER — OMEPRAZOLE 20 MG PO CPDR: 20.0000 mg | DELAYED_RELEASE_CAPSULE | Freq: Two times a day (BID) | ORAL | Status: DC

## 2012-02-29 MED ORDER — PANTOPRAZOLE SODIUM 40 MG PO TBEC
40.0000 mg | DELAYED_RELEASE_TABLET | Freq: Two times a day (BID) | ORAL | Status: DC
  Administered 2012-02-29: 40 mg via ORAL
  Filled 2012-02-29: qty 1

## 2012-02-29 NOTE — Care Management Note (Addendum)
    Page 1 of 1   02/29/2012     2:57:08 PM   CARE MANAGEMENT NOTE 02/29/2012  Patient:  IMER, FOXWORTH   Account Number:  [id number]  Date Initiated:  02/29/2012  Documentation initiated by:  Letha Cape  Subjective/Objective Assessment:   dx acute infective gastroenteritis  admit- pta independent.     Action/Plan:   Anticipated DC Date:  02/29/2012   Anticipated DC Plan:  HOME/SELF CARE      DC Planning Services  CM consult  MATCH Program      Choice offered to / List presented to:             Status of service:  Completed, signed off Medicare Important Message given?   (If response is "NO", the following Medicare IM given date fields will be blank) Date Medicare IM given:   Date Additional Medicare IM given:    Discharge Disposition:  HOME/SELF CARE  Per UR Regulation:  Reviewed for med. necessity/level of care/duration of stay  If discussed at Long Length of Stay Meetings, dates discussed:    Comments:  02/29/12 14:22 Letha Cape RN, BSN [phone] patient is independent pta.  patient states he can afford $46.46 for his flaygl and his otc meds.  I called [facility] to get the price for his flagyl.  Patient is for discharge today.  Patient has transportation at discharge.  Spoke with patient again and he decided he wanted to use the Match Program for his medications and he will be going to [facility].

## 2012-02-29 NOTE — Progress Notes (Signed)
NURSING PROGRESS NOTE  Walter Holder [id number] Discharge Data: 02/29/2012 2:35 PM Attending Provider: Jarome Matin, MD [id number], MD     [name] to be D/C'd Home per MD order.  Discussed with the patient the After Visit Summary and all questions fully answered. All IV's discontinued with no bleeding noted. All belongings returned to patient for patient to take home.   Last Vital Signs:  Blood pressure 107/68, pulse 102, temperature 98.7 F (37.1 C), temperature source Oral, resp. rate 18, height 5\' 8"  (1.727 m), weight 102.059 kg (225 lb), SpO2 96.00%.  Discharge Medication List   Medication List     As of 02/29/2012  2:35 PM    TAKE these medications         alum & mag hydroxide-simeth 200-200-20 MG/5ML suspension   Commonly known as: MAALOX/MYLANTA   Take 15 mLs by mouth every 6 (six) hours as needed. For stomach      HYDROcodone-acetaminophen 5-325 MG per tablet   Commonly known as: NORCO/VICODIN   Take 1 tablet by mouth every 6 (six) hours as needed. For pain      metroNIDAZOLE 500 MG tablet   Commonly known as: FLAGYL   Take 1 tablet (500 mg total) by mouth every 8 (eight) hours.      multivitamin with minerals Tabs   Take 1 tablet by mouth daily.      omeprazole 20 MG capsule   Commonly known as: PRILOSEC   Take 1 capsule (20 mg total) by mouth 2 (two) times daily. For stomach        Rosalie Doctor, RN

## 2012-02-29 NOTE — Discharge Summary (Signed)
Physician Discharge Summary  Patient ID: DAERON CARRENO MRN: [id number] DOB/AGE: October 26, 1955 56 y.o.  Admit date: 02/28/2012 Discharge date: 02/29/2012   Discharge Diagnoses:  Principal Problem:  *Colitis due to Clostridium difficile Active Problems:  Hypokalemia  Low back pain  Acute infective gastroenteritis  Dehydration  Tachycardia  Heme positive stool   Discharged Condition: good  Hospital Course: The patient is a 56 year old Caucasian man with a history of chronic low back pain and a prior hospitalization for C. difficile colitis who presented to the emergency department with nausea, vomiting, and diarrhea for 3 days. He had not had recent antibiotics. His bowel movements also became very dark in color and he had several episodes of vomiting. In the emergency department he had tachycardia with a white blood cell count of 27,000. A CT scan of the abdomen and pelvis showed evidence of enteritis and a stool Hemoccult test was positive. He was admitted for further evaluation. Testing of stool specimen showed that he was difficile toxin positive, so he was started on Flagyl for this. He was also treated with high-dose IV fluids and overnight felt much better. He was able to tolerate clear liquids diet without difficulty and expressed a strong desire to go home as he does not have medical insurance. He is generally very healthy and the plan is to discharge him to home on antibiotics with Pedialyte fluid supplementation and a gradual resumption of normal eating. He was advised to call me ASAP should his diarrhea worsen as this could indicate a need to switch to vancomycin.   Consults: None  Significant Diagnostic Studies:  Ct Abdomen Pelvis W Contrast  02/28/2012  *RADIOLOGY REPORT*  Clinical Data: Lower abdominal pain, vomiting, low grade fever  CT ABDOMEN AND PELVIS WITH CONTRAST  Technique:  Multidetector CT imaging of the abdomen and pelvis was performed following the standard protocol  during bolus administration of intravenous contrast.  Contrast: OMNIPAQUE IOHEXOL 300 MG/ML  SOLN  Comparison: CT abdomen pelvis - 03/05/2008  Findings:  Normal hepatic contour.  Incidental note is made of two hypoattenuating sub centimeter lesions within the dome of the left lobe of the liver (image 13, series 2) which are too small to accurately characterize.  Normal appearance of the gallbladder.  No definite intra or extrahepatic biliary ductal dilatation.  No ascites.  There is symmetric enhancement and excretion of the bilateral kidneys.  There are two adjacent punctate (approximately 2 mm) nonobstructing stones within the inferior pole of the left kidney (image 43) as well as an additional punctate nonobstructing stone in the mid aspect of the right kidney (image 32).  No evidence of urinary obstruction.  No stones are seen within the expected location of either ureter or the urinary bladder.  There are innumerable parapelvic cysts read demonstrated.  There is a minimal amount of symmetric likely age related perinephric stranding. Normal appearance of the bilateral adrenal glands, pancreas and spleen.  There is a moderate to large amount of liquid stool throughout the colon.  There is a moderate to large amount of fluid seen throughout the entirety of the small bowel.  This finding is associated with minimal bowel wall thickening and adjacent mesenteric stranding within several loops of distal small bowel within the right lower abdominal quadrant (images 65 and 69, series 2).  No evidence of enteric obstruction.  No pneumoperitoneum or pneumatosis.  No intra-abdominal abscess or fluid collection. Normal appearance of the appendix.  Normal caliber of the abdominal aorta.  The major  branches of the abdominal aorta are widely patent.  Incidental note is made of a small circumaortic left sided renal vein.  No retroperitoneal, mesenteric, pelvic or inguinal lymphadenopathy.  The pelvic organs are normal.  No  free fluid in the pelvis.  Limited visualization of the lower thorax is negative for focal airspace opacity or pleural effusion.  Normal heart size.  No pericardial effusion.  No acute or aggressive osseous abnormalities.  Mildly accentuated lumbar lordosis.  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 No definite pars defects.  No anterolisthesis.  IMPRESSION:  1.  Fluid filled mildly dilated loops of small bowel with moderate to large amount of liquid stool throughout the colon without evidence of obstruction.  Minimal amount of nonspecific bowel wall thickening and mesenteric stranding adjacent to several loops of small bowel within the right lower abdominal quadrant. Constellation of findings are nonspecific but most suggestive of an enteritis. 2.  Bilateral punctate nonobstructing renal stones. 3.  Innumerable bilateral parapelvic cyst without evidence of urinary obstruction.   Original Report Authenticated By: Tacey Ruiz, MD     Labs: Lab Results  Component Value Date   WBC 13.6* 02/29/2012   HGB 11.7* 02/29/2012   HCT 34.4* 02/29/2012   MCV 87.8 02/29/2012   PLT 180 02/29/2012     Lab 02/29/12 0435 02/28/12 0337  NA 141 --  K 3.3* --  CL 108 --  CO2 26 --  BUN 15 --  CREATININE 0.88 --  CALCIUM 8.3* --  PROT -- 6.9  BILITOT -- 0.5  ALKPHOS -- 57  ALT -- 20  AST -- 23  GLUCOSE 93 --       Lab Results  Component Value Date   INR 1.0 03/11/2008     Recent Results (from the past 240 hour(s))  CLOSTRIDIUM DIFFICILE BY PCR     Status: Abnormal   Collection Time   02/28/12 12:15 PM      Component Value Range Status Comment   C difficile by pcr POSITIVE (*) NEGATIVE Final       Discharge Exam: Blood pressure 107/68, pulse 102, temperature 98.7 F (37.1 C), temperature source Oral, resp. rate 18, height 5\' 8"  (1.727 m), weight 102.059 kg (225 lb), SpO2 96.00%.  Physical Exam: In general,  he is a well-nourished well-developed white man who was in no apparent distress was sitting upright in bed. HEENT exam was within normal limits, neck was supple without jugular venous distention, chest was clear to auscultation, heart had a regular rate and rhythm, abdomen had normal bowel sounds and there was mild tenderness in the left side of the abdomen without rebound tenderness, extremities were without cyanosis, clubbing, or edema.   Disposition:  He will be discharged to home today with care by family members. Is advised that he hydrate himself with 3-4 bottles of Pedialyte per day, and continue a 14 day course of Flagyl with Prilosec 20 mg twice daily for 14 days, then once daily.   Discharge Orders    Future Orders Please Complete By Expires   Diet - low sodium heart healthy      Increase activity slowly      Discharge instructions      Comments:   Call Dr. Silvano Rusk office if you develop fever, chills, worsening diarrhea, dizziness, or other concerning symptoms. Later today call Dr. Silvano Rusk office at [phone] to schedule a followup visit in 1  week.   Call MD for:      Comments:   Steffanie Dunn physician's office if you develop fever, chills, worsening diarrhea, abdominal pain, nausea, vomiting, or other concerning symptoms.       Medication List     As of 02/29/2012  8:57 AM    TAKE these medications         alum & mag hydroxide-simeth 200-200-20 MG/5ML suspension   Commonly known as: MAALOX/MYLANTA   Take 15 mLs by mouth every 6 (six) hours as needed. For stomach      HYDROcodone-acetaminophen 5-325 MG per tablet   Commonly known as: NORCO/VICODIN   Take 1 tablet by mouth every 6 (six) hours as needed. For pain      metroNIDAZOLE 500 MG tablet   Commonly known as: FLAGYL   Take 1 tablet (500 mg total) by mouth every 8 (eight) hours.      multivitamin with minerals Tabs   Take 1 tablet by mouth daily.      omeprazole 20 MG capsule   Commonly known as: PRILOSEC   Take  1 capsule (20 mg total) by mouth 2 (two) times daily. For stomach         Signed: Shivani Barrantes G 02/29/2012, 8:57 AM

## 2012-03-01 NOTE — ED Provider Notes (Signed)
Medical screening examination/treatment/procedure(s) were conducted as a shared visit with non-physician practitioner(s) and myself.  I personally evaluated the patient during the encounter   Gwyneth Sprout, MD 03/01/12 [phone]

## 2012-03-03 LAB — STOOL CULTURE

## 2014-02-08 ENCOUNTER — Encounter (HOSPITAL_COMMUNITY): Payer: Self-pay | Admitting: Nurse Practitioner

## 2014-02-08 ENCOUNTER — Emergency Department (HOSPITAL_COMMUNITY)
Admission: EM | Admit: 2014-02-08 | Discharge: 2014-02-08 | Disposition: A | Discharge status: Home / Self Care | Payer: Self-pay | Attending: Emergency Medicine | Admitting: Emergency Medicine

## 2014-02-08 ENCOUNTER — Emergency Department (HOSPITAL_COMMUNITY): Payer: Self-pay

## 2014-02-08 DIAGNOSIS — R1031 Right lower quadrant pain: Secondary | ICD-10-CM | POA: Insufficient documentation

## 2014-02-08 DIAGNOSIS — I209 Angina pectoris, unspecified: Secondary | ICD-10-CM | POA: Insufficient documentation

## 2014-02-08 DIAGNOSIS — Z8619 Personal history of other infectious and parasitic diseases: Secondary | ICD-10-CM | POA: Insufficient documentation

## 2014-02-08 DIAGNOSIS — Z79899 Other long term (current) drug therapy: Secondary | ICD-10-CM | POA: Insufficient documentation

## 2014-02-08 DIAGNOSIS — K219 Gastro-esophageal reflux disease without esophagitis: Secondary | ICD-10-CM | POA: Insufficient documentation

## 2014-02-08 DIAGNOSIS — K0889 Other specified disorders of teeth and supporting structures: Secondary | ICD-10-CM

## 2014-02-08 DIAGNOSIS — R1032 Left lower quadrant pain: Secondary | ICD-10-CM | POA: Insufficient documentation

## 2014-02-08 DIAGNOSIS — R1011 Right upper quadrant pain: Secondary | ICD-10-CM | POA: Insufficient documentation

## 2014-02-08 DIAGNOSIS — Z9889 Other specified postprocedural states: Secondary | ICD-10-CM | POA: Insufficient documentation

## 2014-02-08 DIAGNOSIS — R1013 Epigastric pain: Secondary | ICD-10-CM

## 2014-02-08 DIAGNOSIS — R109 Unspecified abdominal pain: Secondary | ICD-10-CM

## 2014-02-08 DIAGNOSIS — G8929 Other chronic pain: Secondary | ICD-10-CM | POA: Insufficient documentation

## 2014-02-08 DIAGNOSIS — K088 Other specified disorders of teeth and supporting structures: Secondary | ICD-10-CM | POA: Insufficient documentation

## 2014-02-08 DIAGNOSIS — K029 Dental caries, unspecified: Secondary | ICD-10-CM | POA: Insufficient documentation

## 2014-02-08 DIAGNOSIS — M1990 Unspecified osteoarthritis, unspecified site: Secondary | ICD-10-CM | POA: Insufficient documentation

## 2014-02-08 LAB — URINALYSIS, ROUTINE W REFLEX MICROSCOPIC
Bilirubin Urine: NEGATIVE
GLUCOSE, UA: NEGATIVE mg/dL
KETONES UR: 15 mg/dL — AB
LEUKOCYTES UA: NEGATIVE
Nitrite: NEGATIVE
PH: 5.5 (ref 5.0–8.0)
Protein, ur: NEGATIVE mg/dL
Specific Gravity, Urine: 1.015 (ref 1.005–1.030)
Urobilinogen, UA: 0.2 mg/dL (ref 0.0–1.0)

## 2014-02-08 LAB — URINE MICROSCOPIC-ADD ON

## 2014-02-08 LAB — COMPREHENSIVE METABOLIC PANEL
ALBUMIN: 4.4 g/dL (ref 3.5–5.2)
ALT: 20 U/L (ref 0–53)
AST: 15 U/L (ref 0–37)
Alkaline Phosphatase: 66 U/L (ref 39–117)
Anion gap: 16 — ABNORMAL HIGH (ref 5–15)
BUN: 11 mg/dL (ref 6–23)
CALCIUM: 10 mg/dL (ref 8.4–10.5)
CO2: 24 meq/L (ref 19–32)
CREATININE: 0.79 mg/dL (ref 0.50–1.35)
Chloride: 100 mEq/L (ref 96–112)
GFR calc Af Amer: >90 mL/min (ref >90)
Glucose, Bld: 101 mg/dL — ABNORMAL HIGH (ref 70–99)
Potassium: 3.9 mEq/L (ref 3.7–5.3)
SODIUM: 140 meq/L (ref 137–147)
TOTAL PROTEIN: 7.9 g/dL (ref 6.0–8.3)
Total Bilirubin: 1 mg/dL (ref 0.3–1.2)

## 2014-02-08 LAB — CBC WITH DIFFERENTIAL/PLATELET
Basophils Absolute: 0 10*3/uL (ref 0.0–0.1)
Basophils Relative: 0 % (ref 0–1)
EOS ABS: 0 10*3/uL (ref 0.0–0.7)
EOS PCT: 0 % (ref 0–5)
HCT: 46.7 % (ref 39.0–52.0)
HEMOGLOBIN: 16.9 g/dL (ref 13.0–17.0)
LYMPHS ABS: 2.1 10*3/uL (ref 0.7–4.0)
LYMPHS PCT: 18 % (ref 12–46)
MCH: 31.5 pg (ref 26.0–34.0)
MCHC: 36.2 g/dL — ABNORMAL HIGH (ref 30.0–36.0)
MCV: 87 fL (ref 78.0–100.0)
MONOS PCT: 9 % (ref 3–12)
Monocytes Absolute: 1 10*3/uL (ref 0.1–1.0)
Neutro Abs: 8.4 10*3/uL — ABNORMAL HIGH (ref 1.7–7.7)
Neutrophils Relative %: 73 % (ref 43–77)
PLATELETS: 211 10*3/uL (ref 150–400)
RBC: 5.37 MIL/uL (ref 4.22–5.81)
RDW: 14 % (ref 11.5–15.5)
WBC: 11.6 10*3/uL — AB (ref 4.0–10.5)

## 2014-02-08 LAB — LIPASE, BLOOD: Lipase: 25 U/L (ref 11–59)

## 2014-02-08 MED ORDER — IBUPROFEN 800 MG PO TABS: 800.0000 mg | ORAL_TABLET | Freq: Three times a day (TID) | ORAL | Status: DC

## 2014-02-08 MED ORDER — MORPHINE SULFATE 4 MG/ML IJ SOLN
6.0000 mg | Freq: Once | INTRAMUSCULAR | Status: AC
  Administered 2014-02-08: 6 mg via INTRAVENOUS
  Filled 2014-02-08: qty 2

## 2014-02-08 MED ORDER — IOHEXOL 300 MG/ML  SOLN
25.0000 mL | Freq: Once | INTRAMUSCULAR | Status: AC | PRN
  Administered 2014-02-08: 25 mL via ORAL

## 2014-02-08 MED ORDER — SODIUM CHLORIDE 0.9 % IV BOLUS (SEPSIS)
1000.0000 mL | Freq: Once | INTRAVENOUS | Status: AC
  Administered 2014-02-08: 1000 mL via INTRAVENOUS

## 2014-02-08 MED ORDER — PENICILLIN V POTASSIUM 500 MG PO TABS: 500.0000 mg | ORAL_TABLET | Freq: Four times a day (QID) | ORAL | Status: AC

## 2014-02-08 MED ORDER — IOHEXOL 300 MG/ML  SOLN
100.0000 mL | Freq: Once | INTRAMUSCULAR | Status: AC | PRN
  Administered 2014-02-08: 100 mL via INTRAVENOUS

## 2014-02-08 NOTE — ED Notes (Signed)
CT notified that the pt has finished his contrast.  

## 2014-02-08 NOTE — ED Notes (Signed)
[place], MD at bedside. 

## 2014-02-08 NOTE — ED Notes (Signed)
He states "ive been stressed and i havent been eating well and my teeth are bad. im getting bloated and my stomach hurts now."

## 2014-02-08 NOTE — Discharge Instructions (Signed)
Abdominal Pain Many things can cause abdominal pain. Usually, abdominal pain is not caused by a disease and will improve without treatment. It can often be observed and treated at home. Your health care provider will do a physical exam and possibly order blood tests and X-rays to help determine the seriousness of your pain. However, in many cases, more time must pass before a clear cause of the pain can be found. Before that point, your health care provider may not know if you need more testing or further treatment. HOME CARE INSTRUCTIONS  Monitor your abdominal pain for any changes. The following actions may help to alleviate any discomfort you are experiencing:  Only take over-the-counter or prescription medicines as directed by your health care provider.  Do not take laxatives unless directed to do so by your health care provider.  Try a clear liquid diet (broth, tea, or water) as directed by your health care provider. Slowly move to a bland diet as tolerated. SEEK MEDICAL CARE IF:  You have unexplained abdominal pain.  You have abdominal pain associated with nausea or diarrhea.  You have pain when you urinate or have a bowel movement.  You experience abdominal pain that wakes you in the night.  You have abdominal pain that is worsened or improved by eating food.  You have abdominal pain that is worsened with eating fatty foods.  You have a fever. SEEK IMMEDIATE MEDICAL CARE IF:   Your pain does not go away within 2 hours.  You keep throwing up (vomiting).  Your pain is felt only in portions of the abdomen, such as the right side or the left lower portion of the abdomen.  You pass bloody or black tarry stools. MAKE SURE YOU:  Understand these instructions.   Will watch your condition.   Will get help right away if you are not doing well or get worse.  Document Released: 12/15/2004 Document Revised: 03/12/2013 Document Reviewed: 11/14/2012 [facility] Patient Information  2015 [place], [place]. This information is not intended to replace advice given to you by your health care provider. Make sure you discuss any questions you have with your health care provider.  Dental Pain A tooth ache may be caused by cavities (tooth decay). Cavities expose the nerve of the tooth to air and hot or cold temperatures. It may come from an infection or abscess (also called a boil or furuncle) around your tooth. It is also often caused by dental caries (tooth decay). This causes the pain you are having. DIAGNOSIS  Your caregiver can diagnose this problem by exam. TREATMENT   If caused by an infection, it may be treated with medications which kill germs (antibiotics) and pain medications as prescribed by your caregiver. Take medications as directed.  Only take over-the-counter or prescription medicines for pain, discomfort, or fever as directed by your caregiver.  Whether the tooth ache today is caused by infection or dental disease, you should see your dentist as soon as possible for further care. SEEK MEDICAL CARE IF: The exam and treatment you received today has been provided on an emergency basis only. This is not a substitute for complete medical or dental care. If your problem worsens or new problems (symptoms) appear, and you are unable to meet with your dentist, call or return to this location. SEEK IMMEDIATE MEDICAL CARE IF:   You have a fever.  You develop redness and swelling of your face, jaw, or neck.  You are unable to open your mouth.  You  have severe pain uncontrolled by pain medicine. MAKE SURE YOU:   Understand these instructions.  Will watch your condition.  Will get help right away if you are not doing well or get worse. Document Released: 03/07/2005 Document Revised: 05/30/2011 Document Reviewed: 10/24/2007 [facility] Patient Information 2015 [place]Care, [place]. This information is not intended to replace advice given to you by your health care provider.  Make sure you discuss any questions you have with your health care provider.   Emergency Department Resource Guide 1) Find a Doctor and Pay Out of Pocket Although you won't have to find out who is covered by your insurance plan, it is a good idea to ask around and get recommendations. You will then need to call the office and see if the doctor you have chosen will accept you as a new patient and what types of options they offer for patients who are self-pay. Some doctors offer discounts or will set up payment plans for their patients who do not have insurance, but you will need to ask so you aren't surprised when you get to your appointment.  2) Contact Your Local Health Department Not all health departments have doctors that can see patients for sick visits, but many do, so it is worth a call to see if yours does. If you don't know where your local health department is, you can check in your phone book. The CDC also has a tool to help you locate your state's health department, and many state websites also have listings of all of their local health departments.  3) Find a Walk-in Clinic If your illness is not likely to be very severe or complicated, you may want to try a walk in clinic. These are popping up all over the country in pharmacies, drugstores, and shopping centers. They're usually staffed by nurse practitioners or physician assistants that have been trained to treat common illnesses and complaints. They're usually fairly quick and inexpensive. However, if you have serious medical issues or chronic medical problems, these are probably not your best option.  No Primary Care Doctor: - Call Health Connect at  [phone] - they can help you locate a primary care doctor that  accepts your insurance, provides certain services, etc. - Physician Referral Service- [phone]  Chronic Pain Problems: Organization         Address  Phone   Notes  [facility]  [phone]  Patients need to be referred by their primary care doctor.   Medication Assistance: Organization         Address  Phone   Notes  [facility] Medication [facility] [address] [place], [place] [address] [phone] --Must be a resident of [place] -- Must have NO insurance coverage whatsoever (no Medicaid/ Medicare, etc.) -- The pt. MUST have a primary care doctor that directs their care regularly and follows them in the community   MedAssist  [phone]   [employer]  [phone]    Agencies that provide inexpensive medical care: Organization         Address  Phone   Notes  [facility]  [phone]   [facility]    [phone]   [facility] [address] [place], [place] [address] [phone]   [facility] [address], [place] [phone]   [facility]    [phone]   [facility]    [phone]   [address], [place] Phone:  [phone], Fax:  [phone] Hours of Operation:  9 am - 6 pm, M-F.  Also accepts Medicaid/Medicare and self-pay.  [facility] for [place] [address], [address], [place] Phone: [phone], Fax: [phone]. Hours of Operation:  8:30 am - 5:30 pm, M-F.  Also accepts Medicaid and self-pay.  [facility] [address], [place] Phone: [phone]   [place], [place], [place] [phone], Ext. [phone] Mondays & Thursdays: 7-9 AM.  First 15 patients are seen on a first come, first serve basis.    [place] Providers:  Organization         Address  Phone   Notes  [facility] [address], [place] [phone] Also accepts self-pay patients.  [facility] [id number] [place], [place]  [phone]   [place], [address], [place] [phone]   [facility] [address] [place] [phone]   Lucianne Lei [address], [place]   [phone] Only accepts [place] Access [place] patients after they have their name applied to their card.   Self-Pay (no insurance) in [facility]:  Organization         Address  Phone   Notes  Sickle Cell Patients, [facility] [place] [phone]   [facility] [place] [phone]   [facility] [place]  [place], [place], [place] [phone]   [facility]/Dr. Osei-Bonsu  [address] [place] or [address], [place] [phone] Phone number for both [place] and [place] locations is the same.  [facility] [address], [place] [phone]   [facility] [address] [place] or [address] [phone]   [facility] [address] [place] [phone], phone; [phone], fax Sees patients 1st and 3rd Saturday of every month.  Must not qualify for public or private insurance (i.e. Medicaid, Medicare, [place] Health Choice, Veterans' Benefits)  Household income should be no more than 200% of the poverty level The clinic cannot treat you if you are pregnant or think you are pregnant  Sexually transmitted diseases are not treated at the clinic.    Dental Care: Organization         Address  Phone  Notes  [facility] [phone] Accepts children up to age 53 who are enrolled in [place] or [place]; pregnant women with a Medicaid card; and children who have applied for Medicaid or [place] Health Choice, but were  declined, whose parents can pay a reduced fee at time of service.  [facility] Department of [facility]  [address], [place] [phone] Accepts children up to age 49 who are enrolled in [place] or [place]; pregnant women with a Medicaid card; and children who have applied for Medicaid or [place] Health Choice, but were declined, whose parents can pay a reduced fee at time of service.  [place]  Access PROGRAM  [place] [phone] Patients are seen by appointment only. Walk-ins are not accepted. [place] will see patients 92 years of age and older. Monday - Tuesday (8am-5pm) Most Wednesdays (8:30-5pm) $30 per visit, cash only  [facility] Access PROGRAM  [address], [facility] [phone] Patients are seen by appointment only. Walk-ins are not accepted. [place] will see patients 55 years of age and older. One Wednesday Evening (Monthly: Volunteer Based).  $30 per visit, cash only  [place]  [phone] for adults; Children under age 68, call Graduate Pediatric Dentistry at [phone]. Children aged 60-14, please call [phone] to request a pediatric application.  Dental services are provided in all areas of dental care including fillings, crowns and bridges, complete and partial dentures, implants, gum treatment, root canals, and extractions. Preventive care is also provided. Treatment is provided to both adults and children. Patients are selected via a lottery and there is often a waiting list.   [facility] [address] [place]  [phone] [email or web address]   [facility] [address] [place], [place] [phone], Ext. [phone] Second and Fourth Thursday of each month, opens at 6:30 AM; Clinic ends at 9 AM.  Patients are seen on a first-come first-served basis, and a limited number are seen during each clinic.    [facility]  [address] Hillard Danker [place], [place] [phone]   Eligibility Requirements You must have lived in [place], [place], or [place] counties for at least the last three months.   You cannot be eligible for state or federal sponsored [employer], including [employer], [place], or [employer].   You generally cannot be eligible for healthcare insurance through your employer.    How to apply: Eligibility screenings are held every Tuesday and Wednesday afternoon from 1:00 pm until 4:00 pm. You do not need an appointment for the interview!  [facility] [address] [place], [place]   [place]  [place] Department  [place]  [phone]    Behavioral Health Resources in the Community: Intensive Outpatient Programs Organization         Address  Phone  Notes  [place]. [address], [place], [place] [phone]   [facility] [address] [place], [place]   ADS: Alcohol & Drug Svcs [address] [place], [place]   [place] [address],  [place], [place] or [phone]   Substance Abuse Resources Organization         Address  Phone  Notes  Alcohol and Drug Services  [phone]   [place]  [phone]   [facility]   Chinita Pester  [phone]   Residential & Outpatient Substance Abuse Program  [phone]   Psychological Services Organization         Address  Phone  Notes  [facility] [place]  [place]  [phone]   [place] [address] [place] or [phone]    Mobile Crisis Teams Organization         Address  Phone  Notes  [facility], Mobile Crisis Care  Unit  [phone]   [facility]  [address] [place],  [place] [address] [place] [phone]    Self-Help/Support Groups Organization         Address  Phone             Notes  [facility] [place] [facility] - variety of support groups  [place] Call for more information  Narcotics Anonymous (NA), [facility] [address], [employer] [place]  2 meetings at this location   Special educational needs teacher         Address  Phone  Notes  [facility] Residential Treatment [place],    [place]  [phone]   [facility]  [address] [place] [id number], [place], [place]   [place], [place] [phone] Admissions: 8am-3pm M-F  Incentives Substance [place] [address],    [place], [place] [phone]   [facility] [address] [place], [place], [place]   [facility] [address],  [place], [place]   [facility] - Intensive Outpatient [address], [place], [place]   [facility] ([place] [place].,  [place], [place] [phone] or [phone]   [facility] (RTS) [address] [place], [place] Accepts Medicaid  [facility] [place] [address]  [place] [phone] Substance Abuse/Addiction Treatment   [facility] Organization         Address  Phone  Notes  [facility]  [phone]   Domenic Schwab, PhD [address] Arlis Porta [place], [place]   [phone] or [phone]   [place], [place] [phone]   [facility] [address] [place], [place] [phone] Insurance/Medicaid/sponsorship through [facility] [address], [address] [place]                                     [place], [place] [phone] [place] [address], [place] [phone]    Dr. Adele Schilder  [phone]   [facility] Dept. 1) [address] [place] 2) [place] 3)  [place] [address], [place] [phone]  [phone]   [place] [phone] or [phone] (After Hours)

## 2014-02-08 NOTE — ED Provider Notes (Signed)
CSN: [id number]     Arrival date & time 02/08/14  1829 History   First MD Initiated Contact with Patient 02/08/14 1931     Chief Complaint  Patient presents with  . Abdominal Pain     (Consider location/radiation/quality/duration/timing/severity/associated sxs/prior Treatment) Patient is a 58 y.o. male presenting with abdominal pain. The history is provided by the patient.  Abdominal Pain Pain location:  Generalized Pain quality comment:  Sore, tender Pain radiates to:  Does not radiate Pain severity:  Moderate Onset quality:  Gradual Duration:  2 days Timing:  Constant Progression:  Unchanged Chronicity:  New Context: recent illness ("bad teeth")   Relieved by:  Nothing Worsened by:  Nothing tried Associated symptoms: no cough, no diarrhea, no fever, no shortness of breath and no vomiting     Past Medical History  Diagnosis Date  . Complication of anesthesia     "my ears rang before I went out" (02/28/2012)  . C. difficile diarrhea ~ 2008; 02/28/2012  . Anginal pain ~ 2007  . GERD (gastroesophageal reflux disease)   . Chronic lower back pain   . Arthritis     "might be getting some" (02/28/2012)  . Stress at home     "from my daughter mostly" (02/28/2012)   Past Surgical History  Procedure Laterality Date  . Multiple tooth extractions  1990's    "3" (02/28/2012)  . Cardiac catheterization  ~ 2007   History reviewed. No pertinent family history. History  Substance Use Topics  . Smoking status: Never Smoker   . Smokeless tobacco: Never Used  . Alcohol Use: 3.6 oz/week    6 Cans of beer per week     Comment: 02/28/2012 "5-6 beers on the weekends"    Review of Systems  Constitutional: Negative for fever.  Respiratory: Negative for cough and shortness of breath.   Gastrointestinal: Positive for abdominal pain. Negative for vomiting and diarrhea.  All other systems reviewed and are negative.     Allergies  Review of patient's allergies indicates no known  allergies.  Home Medications   Prior to Admission medications   Medication Sig Start Date End Date Taking? Authorizing Provider  alum & mag hydroxide-simeth (MAALOX/MYLANTA) 200-200-20 MG/5ML suspension Take 15 mLs by mouth every 6 (six) hours as needed. For stomach    Historical Provider, MD  HYDROcodone-acetaminophen (NORCO/VICODIN) 5-325 MG per tablet Take 1 tablet by mouth every 6 (six) hours as needed. For pain    Historical Provider, MD  metroNIDAZOLE (FLAGYL) 500 MG tablet Take 1 tablet (500 mg total) by mouth every 8 (eight) hours. 02/29/12   Jarome Matin, MD  Multiple Vitamin (MULTIVITAMIN WITH MINERALS) TABS Take 1 tablet by mouth daily.    Historical Provider, MD  omeprazole (PRILOSEC) 20 MG capsule Take 1 capsule (20 mg total) by mouth 2 (two) times daily. For stomach 02/29/12   Jarome Matin, MD   BP 157/94 mmHg  Pulse 102  Temp(Src) 98 F (36.7 C) (Oral)  Resp 13  Ht 5\' 7"  (1.702 m)  Wt 200 lb (90.719 kg)  BMI 31.32 kg/m2  SpO2 98% Physical Exam  Constitutional: He is oriented to person, place, and time. He appears well-developed and well-nourished. No distress.  HENT:  Head: Normocephalic and atraumatic.  Mouth/Throat: Oropharynx is clear and moist. Dental caries (multiple dental caries and broken teeth. No severe tenderness on palpation of teeth) present. No oropharyngeal exudate or tonsillar abscesses.  Eyes: EOM are normal. Pupils are equal, round, and reactive to light.  Neck: Normal range of motion. Neck supple.  Cardiovascular: Normal rate and regular rhythm.  Exam reveals no friction rub.   No murmur heard. Pulmonary/Chest: Effort normal and breath sounds normal. No respiratory distress. He has no wheezes. He has no rales.  Abdominal: Soft. He exhibits no distension. There is tenderness (severe, RUQ, bilateral lower quadrants). There is no rebound.  Musculoskeletal: Normal range of motion. He exhibits no edema.  Neurological: He is alert and oriented to  person, place, and time. No cranial nerve deficit. He exhibits normal muscle tone. Coordination normal.  Skin: No rash noted. He is not diaphoretic.  Nursing note and vitals reviewed.   ED Course  Procedures (including critical care time) Labs Review Labs Reviewed  CBC WITH DIFFERENTIAL - Abnormal; Notable for the following:    WBC 11.6 (*)    MCHC 36.2 (*)    Neutro Abs 8.4 (*)    All other components within normal limits  COMPREHENSIVE METABOLIC PANEL  URINALYSIS, ROUTINE W REFLEX MICROSCOPIC  LIPASE, BLOOD    Imaging Review Ct Abdomen Pelvis W Contrast  02/08/2014   CLINICAL DATA:  Epigastric pain and left lower quadrant pain. No bowel movement in 2 days. Decreased appetite.  EXAM: CT ABDOMEN AND PELVIS WITH CONTRAST  TECHNIQUE: Multidetector CT imaging of the abdomen and pelvis was performed using the standard protocol following bolus administration of intravenous contrast.  CONTRAST:  100mL OMNIPAQUE IOHEXOL 300 MG/ML  SOLN  COMPARISON:  02/28/2012  FINDINGS: Mild dependent atelectasis in the lung bases. Small esophageal hiatal hernia.  Subcentimeter cysts in the left lobe of the liver. The gallbladder, spleen, pancreas, adrenal glands, abdominal aorta, inferior vena cava, and retroperitoneal lymph nodes are unremarkable. Bilateral parapelvic renal cysts. No solid mass or hydronephrosis. Stomach and small bowel are decompressed. Contrast material has flow through to the colon suggesting no evidence of bowel obstruction. Colon is mostly decompressed with scattered stool present. No free air or free fluid in the abdomen.  Pelvis: The appendix is normal. Scattered diverticula in the sigmoid colon. No evidence of diverticulitis. Bladder wall is not thickened. Prostate gland is not significantly enlarged. No free or loculated pelvic fluid collections. No pelvic mass or lymphadenopathy. Degenerative changes in the lumbar spine. No destructive bone lesions appreciated.  IMPRESSION: No acute  process demonstrated in the abdomen or pelvis. Small esophageal hiatal hernia. Parapelvic cysts in the kidneys. No evidence of bowel obstruction or inflammation.   Electronically Signed   By: Burman NievesWilliam  Stevens M.D.   On: 02/08/2014 22:15     EKG Interpretation None      MDM   Final diagnoses:  Abdominal pain  Pain, dental    58 year old male presents with multiple complaints. He initially states his teeth are bad and he has been eating well. He thinks this has led to his abdominal pain. Pain since yesterday, described as sore. Constant, no alleviating or exacerbating factors. No prior history of abdominal pain like this. No fevers, vomiting, diarrhea. Here afebrile, mild tachycardia just over 100. Normotensive. Belly extremely tender diffusely, worse in right upper quadrant and lower quadrants. Plan for CT of his abdomen. Of note son reports she has been sleeping and nerves in that he has history of C. difficile multiple times. No recent antibiotics today  Scan ok. Patient feeling better after meds. Will give penicillin and motrin for his dental pain, resource guide given to find a dentist. On re-exam, patient continually refers to a lot of stress at home, which may be contributing  to his symptoms. Stable for discharge.  Elwin MochaBlair Avis Mcmahill, MD 02/08/14 [phone]

## 2014-03-27 ENCOUNTER — Encounter (HOSPITAL_COMMUNITY): Payer: Self-pay | Admitting: Emergency Medicine

## 2014-03-27 ENCOUNTER — Ambulatory Visit (HOSPITAL_COMMUNITY)
Admission: RE | Admit: 2014-03-27 | Discharge: 2014-03-27 | Disposition: A | Discharge status: Home / Self Care | Payer: Self-pay | Attending: Psychiatry | Admitting: Psychiatry

## 2014-03-27 ENCOUNTER — Emergency Department (HOSPITAL_COMMUNITY)
Admission: EM | Admit: 2014-03-27 | Discharge: 2014-03-29 | Disposition: A | Discharge status: Other Acute Inpatient Hospital | Payer: Federal, State, Local not specified - Other | Attending: Emergency Medicine | Admitting: Emergency Medicine

## 2014-03-27 DIAGNOSIS — R4189 Other symptoms and signs involving cognitive functions and awareness: Secondary | ICD-10-CM

## 2014-03-27 DIAGNOSIS — G8929 Other chronic pain: Secondary | ICD-10-CM | POA: Insufficient documentation

## 2014-03-27 DIAGNOSIS — Z9889 Other specified postprocedural states: Secondary | ICD-10-CM | POA: Insufficient documentation

## 2014-03-27 DIAGNOSIS — Z791 Long term (current) use of non-steroidal anti-inflammatories (NSAID): Secondary | ICD-10-CM | POA: Insufficient documentation

## 2014-03-27 DIAGNOSIS — F1310 Sedative, hypnotic or anxiolytic abuse, uncomplicated: Secondary | ICD-10-CM | POA: Diagnosis present

## 2014-03-27 DIAGNOSIS — R4689 Other symptoms and signs involving appearance and behavior: Secondary | ICD-10-CM

## 2014-03-27 DIAGNOSIS — Z8679 Personal history of other diseases of the circulatory system: Secondary | ICD-10-CM | POA: Insufficient documentation

## 2014-03-27 DIAGNOSIS — Z8619 Personal history of other infectious and parasitic diseases: Secondary | ICD-10-CM | POA: Insufficient documentation

## 2014-03-27 DIAGNOSIS — F1120 Opioid dependence, uncomplicated: Secondary | ICD-10-CM | POA: Diagnosis not present

## 2014-03-27 DIAGNOSIS — F333 Major depressive disorder, recurrent, severe with psychotic symptoms: Secondary | ICD-10-CM | POA: Diagnosis present

## 2014-03-27 DIAGNOSIS — R45851 Suicidal ideations: Secondary | ICD-10-CM

## 2014-03-27 DIAGNOSIS — M1990 Unspecified osteoarthritis, unspecified site: Secondary | ICD-10-CM | POA: Insufficient documentation

## 2014-03-27 DIAGNOSIS — K219 Gastro-esophageal reflux disease without esophagitis: Secondary | ICD-10-CM | POA: Insufficient documentation

## 2014-03-27 LAB — COMPREHENSIVE METABOLIC PANEL
ALT: 19 U/L (ref 0–53)
AST: 20 U/L (ref 0–37)
Albumin: 4.8 g/dL (ref 3.5–5.2)
Alkaline Phosphatase: 64 U/L (ref 39–117)
Anion gap: 15 (ref 5–15)
BUN: 17 mg/dL (ref 6–23)
CALCIUM: 10.2 mg/dL (ref 8.4–10.5)
CHLORIDE: 103 meq/L (ref 96–112)
CO2: 20 mmol/L (ref 19–32)
CREATININE: 1 mg/dL (ref 0.50–1.35)
GFR calc Af Amer: >90 mL/min (ref >90)
GFR, EST NON AFRICAN AMERICAN: 82 mL/min — AB (ref >90)
GLUCOSE: 97 mg/dL (ref 70–99)
Potassium: 4.1 mmol/L (ref 3.5–5.1)
Sodium: 138 mmol/L (ref 135–145)
Total Bilirubin: 1.6 mg/dL — ABNORMAL HIGH (ref 0.3–1.2)
Total Protein: 8.3 g/dL (ref 6.0–8.3)

## 2014-03-27 LAB — CBC
HEMATOCRIT: 54.2 % — AB (ref 39.0–52.0)
Hemoglobin: 19.6 g/dL — ABNORMAL HIGH (ref 13.0–17.0)
MCH: 32.2 pg (ref 26.0–34.0)
MCHC: 36.2 g/dL — AB (ref 30.0–36.0)
MCV: 89 fL (ref 78.0–100.0)
Platelets: 230 10*3/uL (ref 150–400)
RBC: 6.09 MIL/uL — ABNORMAL HIGH (ref 4.22–5.81)
RDW: 13.7 % (ref 11.5–15.5)
WBC: 11 10*3/uL — AB (ref 4.0–10.5)

## 2014-03-27 LAB — RAPID URINE DRUG SCREEN, HOSP PERFORMED
AMPHETAMINES: NOT DETECTED
Barbiturates: NOT DETECTED
Benzodiazepines: POSITIVE — AB
Cocaine: NOT DETECTED
Opiates: NOT DETECTED
TETRAHYDROCANNABINOL: NOT DETECTED

## 2014-03-27 LAB — ACETAMINOPHEN LEVEL

## 2014-03-27 LAB — ETHANOL

## 2014-03-27 LAB — SALICYLATE LEVEL

## 2014-03-27 MED ORDER — NICOTINE 21 MG/24HR TD PT24
21.0000 mg | MEDICATED_PATCH | Freq: Every day | TRANSDERMAL | Status: DC
  Filled 2014-03-27: qty 1

## 2014-03-27 MED ORDER — IBUPROFEN 200 MG PO TABS: 600.0000 mg | ORAL_TABLET | Freq: Three times a day (TID) | ORAL | Status: DC | PRN

## 2014-03-27 MED ORDER — ACETAMINOPHEN 325 MG PO TABS: 650.0000 mg | ORAL_TABLET | ORAL | Status: DC | PRN

## 2014-03-27 MED ORDER — ZOLPIDEM TARTRATE 5 MG PO TABS: 5.0000 mg | ORAL_TABLET | Freq: Every evening | ORAL | Status: DC | PRN

## 2014-03-27 MED ORDER — ONDANSETRON HCL 4 MG PO TABS
4.0000 mg | ORAL_TABLET | Freq: Three times a day (TID) | ORAL | Status: DC | PRN
Start: 2014-03-27 — End: 2014-03-29

## 2014-03-27 MED ORDER — ALUM & MAG HYDROXIDE-SIMETH 200-200-20 MG/5ML PO SUSP: 30.0000 mL | ORAL | Status: DC | PRN

## 2014-03-27 NOTE — ED Notes (Addendum)
Family at bedside.  Pt's son [phone] to be contacted with pt's disposition

## 2014-03-27 NOTE — ED Notes (Signed)
Offered pt Malawiturkey sandwich-pt declined at this time.

## 2014-03-27 NOTE — BH Assessment (Signed)
[facility]   Clinician spoke with Walter [facility] at [facility].  She said that patient had been accepted to their wait list.

## 2014-03-27 NOTE — ED Provider Notes (Signed)
CSN: [id number]     Arrival date & time 03/27/14  1743 History   First MD Initiated Contact with Patient 03/27/14 1946     Chief Complaint  Patient presents with  . Suicidal     (Consider location/radiation/quality/duration/timing/severity/associated sxs/prior Treatment) HPI Patient presents to the emergency department with suicidal thoughts and ideation.  The patient will not give me much history about why he feels this way or what has led him to this point.  Patient does not give me any specifics.  He is very vague.  Patient seems disinterested in my questions and does not answer them.  Patient advises that he has a history of depression and anxiety.  He states that he is also having difficulty with sleeping Past Medical History  Diagnosis Date  . Complication of anesthesia     "my ears rang before I went out" (02/28/2012)  . C. difficile diarrhea ~ 2008; 02/28/2012  . Anginal pain ~ 2007  . GERD (gastroesophageal reflux disease)   . Chronic lower back pain   . Arthritis     "might be getting some" (02/28/2012)  . Stress at home     "from my daughter mostly" (02/28/2012)   Past Surgical History  Procedure Laterality Date  . Multiple tooth extractions  1990's    "3" (02/28/2012)  . Cardiac catheterization  ~ 2007   History reviewed. No pertinent family history. History  Substance Use Topics  . Smoking status: Never Smoker   . Smokeless tobacco: Never Used  . Alcohol Use: 3.6 oz/week    6 Cans of beer per week     Comment: 02/28/2012 "5-6 beers on the weekends"    Review of Systems   Level V caveat applies due to uncooperativeness Allergies  Review of patient's allergies indicates no known allergies.  Home Medications   Prior to Admission medications   Medication Sig Start Date End Date Taking? Authorizing Provider  HYDROcodone-acetaminophen (NORCO/VICODIN) 5-325 MG per tablet Take 1 tablet by mouth every 6 (six) hours as needed. For pain   Yes Historical Provider,  MD  ibuprofen (ADVIL,MOTRIN) 800 MG tablet Take 1 tablet (800 mg total) by mouth 3 (three) times daily. 02/08/14   Elwin Mocha, MD  metroNIDAZOLE (FLAGYL) 500 MG tablet Take 1 tablet (500 mg total) by mouth every 8 (eight) hours. Patient not taking: Reported on 02/08/2014 02/29/12   Jarome Matin, MD  omeprazole (PRILOSEC) 20 MG capsule Take 1 capsule (20 mg total) by mouth 2 (two) times daily. For stomach Patient not taking: Reported on 02/08/2014 02/29/12   Jarome Matin, MD   BP 149/96 mmHg  Pulse 135  Temp(Src) 98.7 F (37.1 C) (Oral)  Resp 16  SpO2 94% Physical Exam  Constitutional: He is oriented to person, place, and time. He appears well-developed and well-nourished.  HENT:  Head: Normocephalic and atraumatic.  Mouth/Throat: Oropharynx is clear and moist.  Eyes: Pupils are equal, round, and reactive to light.  Neck: Normal range of motion. Neck supple.  Cardiovascular: Normal rate and regular rhythm.  Exam reveals no gallop and no friction rub.   No murmur heard. Pulmonary/Chest: Effort normal and breath sounds normal. No respiratory distress.  Neurological: He is alert and oriented to person, place, and time. He exhibits normal muscle tone. Coordination normal.  Skin: Skin is warm and dry. No rash noted. No erythema.  Psychiatric: He is withdrawn. He exhibits a depressed mood. He expresses suicidal ideation. He is inattentive.    ED Course  Procedures (including  critical care time) Labs Review Labs Reviewed  ACETAMINOPHEN LEVEL - Abnormal; Notable for the following:    Acetaminophen (Tylenol), Serum <10.0 (*)    All other components within normal limits  CBC - Abnormal; Notable for the following:    WBC 11.0 (*)    RBC 6.09 (*)    Hemoglobin 19.6 (*)    HCT 54.2 (*)    MCHC 36.2 (*)    All other components within normal limits  COMPREHENSIVE METABOLIC PANEL - Abnormal; Notable for the following:    Total Bilirubin 1.6 (*)    GFR calc non Af Amer 82 (*)     All other components within normal limits  URINE RAPID DRUG SCREEN (HOSP PERFORMED) - Abnormal; Notable for the following:    Benzodiazepines POSITIVE (*)    All other components within normal limits  ETHANOL  SALICYLATE LEVEL     Patient will need TTS evaluation MDM   Final diagnoses:  None      Carlyle DollyChristopher W Celise Bazar, PA-C 03/28/14 0131  Rolland PorterMark James, MD 04/07/14 [phone]

## 2014-03-27 NOTE — ED Notes (Signed)
Patient Denies SI, HI, AVH at present. Reports recent passive HI and AVH. Appears flat, uninterested. States appetite has been poor "for a couple of months" and states he has lost more than 10lbs. States that he has trouble staying asleep. Rates feelings of anxiety and depression at 10/10. C/o chronic knee and back pain r/t arthritis.   Encouragement offered. Oriented to the unit. Declines intervention at present.  Q 15 safety checks in place.

## 2014-03-27 NOTE — BH Assessment (Signed)
Kelly Southard, AC at [facility], confirmed adult unit is currently at capacity. Contacted the following facilities for placement:  BED AVAILABLE. FAXED CLINICAL INFORMATION: [facility], per [facility], per Misty  AT CAPACITY:  [facility], per [facility], per [facility], per [facility], per [facility], per [facility], per [facility], per [facility], per [facility], per [facility], per [facility], per [facility], per [facility], per [facility], per [facility], per Barbara  NO RESPONSE: [facility]   Tacarra Justo Ellis Loy Little Jr, LPC, NCC Triage Specialist [phone] 
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
## 2014-03-28 ENCOUNTER — Emergency Department (HOSPITAL_COMMUNITY): Payer: Self-pay

## 2014-03-28 DIAGNOSIS — F323 Major depressive disorder, single episode, severe with psychotic features: Secondary | ICD-10-CM

## 2014-03-28 DIAGNOSIS — F1120 Opioid dependence, uncomplicated: Secondary | ICD-10-CM | POA: Diagnosis not present

## 2014-03-28 DIAGNOSIS — F333 Major depressive disorder, recurrent, severe with psychotic symptoms: Secondary | ICD-10-CM | POA: Diagnosis present

## 2014-03-28 DIAGNOSIS — F1310 Sedative, hypnotic or anxiolytic abuse, uncomplicated: Secondary | ICD-10-CM | POA: Diagnosis present

## 2014-03-28 DIAGNOSIS — R45851 Suicidal ideations: Secondary | ICD-10-CM

## 2014-03-28 LAB — URINALYSIS W MICROSCOPIC (NOT AT ARMC)
GLUCOSE, UA: NEGATIVE mg/dL
Ketones, ur: >80 mg/dL — AB
LEUKOCYTES UA: NEGATIVE
NITRITE: NEGATIVE
PH: 6 (ref 5.0–8.0)
Protein, ur: 30 mg/dL — AB
Specific Gravity, Urine: 1.026 (ref 1.005–1.030)
Urobilinogen, UA: 0.2 mg/dL (ref 0.0–1.0)

## 2014-03-28 MED ORDER — LORAZEPAM 2 MG/ML IJ SOLN: 1.0000 mg | Freq: Four times a day (QID) | INTRAMUSCULAR | Status: DC | PRN

## 2014-03-28 MED ORDER — LORAZEPAM 1 MG PO TABS
1.0000 mg | ORAL_TABLET | Freq: Once | ORAL | Status: AC
  Administered 2014-03-28: 1 mg via ORAL

## 2014-03-28 MED ORDER — ADULT MULTIVITAMIN W/MINERALS CH
1.0000 | ORAL_TABLET | Freq: Every day | ORAL | Status: DC
  Administered 2014-03-28 – 2014-03-29 (×2): 1 via ORAL
  Filled 2014-03-28 (×2): qty 1

## 2014-03-28 MED ORDER — PERPHENAZINE 2 MG PO TABS
2.0000 mg | ORAL_TABLET | Freq: Two times a day (BID) | ORAL | Status: DC
  Administered 2014-03-28 – 2014-03-29 (×3): 2 mg via ORAL
  Filled 2014-03-28 (×4): qty 1

## 2014-03-28 MED ORDER — DIPHENHYDRAMINE HCL 25 MG PO CAPS
25.0000 mg | ORAL_CAPSULE | Freq: Two times a day (BID) | ORAL | Status: DC
  Administered 2014-03-28 – 2014-03-29 (×3): 25 mg via ORAL
  Filled 2014-03-28 (×4): qty 1

## 2014-03-28 MED ORDER — THIAMINE HCL 100 MG/ML IJ SOLN: 100.0000 mg | Freq: Every day | INTRAMUSCULAR | Status: DC

## 2014-03-28 MED ORDER — LORAZEPAM 2 MG/ML IJ SOLN: 2.0000 mg | Freq: Once | INTRAMUSCULAR | Status: DC

## 2014-03-28 MED ORDER — VITAMIN B-1 100 MG PO TABS
100.0000 mg | ORAL_TABLET | Freq: Every day | ORAL | Status: DC
  Administered 2014-03-28 – 2014-03-29 (×2): 100 mg via ORAL
  Filled 2014-03-28 (×2): qty 1

## 2014-03-28 MED ORDER — LORAZEPAM 1 MG PO TABS
0.0000 mg | ORAL_TABLET | Freq: Four times a day (QID) | ORAL | Status: DC
Start: 2014-03-28 — End: 2014-03-29
  Filled 2014-03-28: qty 1

## 2014-03-28 MED ORDER — MIRTAZAPINE 30 MG PO TABS
15.0000 mg | ORAL_TABLET | Freq: Every day | ORAL | Status: DC
  Administered 2014-03-28: 15 mg via ORAL
  Filled 2014-03-28: qty 1

## 2014-03-28 MED ORDER — LORAZEPAM 1 MG PO TABS
1.0000 mg | ORAL_TABLET | Freq: Four times a day (QID) | ORAL | Status: DC | PRN
  Administered 2014-03-29: 1 mg via ORAL
  Filled 2014-03-28: qty 1

## 2014-03-28 MED ORDER — LORAZEPAM 1 MG PO TABS: 0.0000 mg | ORAL_TABLET | Freq: Two times a day (BID) | ORAL | Status: DC

## 2014-03-28 MED ORDER — FOLIC ACID 1 MG PO TABS
1.0000 mg | ORAL_TABLET | Freq: Every day | ORAL | Status: DC
  Administered 2014-03-28 – 2014-03-29 (×2): 1 mg via ORAL
  Filled 2014-03-28 (×2): qty 1

## 2014-03-28 MED ORDER — LORAZEPAM 2 MG/ML IJ SOLN
2.0000 mg | Freq: Once | INTRAMUSCULAR | Status: AC
  Administered 2014-03-28: 2 mg via INTRAMUSCULAR
  Filled 2014-03-28: qty 1

## 2014-03-28 NOTE — ED Provider Notes (Signed)
Psychiatry requested that pt be IVC'ed.  I evaluated the pt and do not feel that he is competent to make decisions for himself.  IVC paperwork signed.  Mirian MoMatthew Gentry, MD 03/28/14 [phone]
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
## 2014-03-28 NOTE — BH Assessment (Signed)
[facility] Assessment Progress Note  [facility] called to inform us that pt has been accepted to their facility by Norval GableKimberly Squires, NP to the service of Gearldine BienenstockSamantha Suffren, MD.  Room assignment is 157-1, and report is to be called to [phone].  If pt is to be transferred tonight, he must arrive before 23:00, otherwise transfer must be postponed until morning.  Britta MccreedyBarbara also asks whether pt will be voluntary or involuntary.  I spoke to the pt and he had difficulty deciding whether or not he would be willing to consent for treatment; this appeared to be in part secondary to disorganized thought processes.  I then staffed his needs with pt's nurse, Joslyn Devonaroline Beaudry, RN, as well as Nanine MeansJamison Lord, NP and EDP Mirian MoMatthew Gentry, MD.  Dr Littie DeedsGentry has agreed to examine the pt and determine whether to initiate IVC.  Per Catha NottinghamJamison, as of this writing certain labs and tests are pending for medical clearance.  I will ask Olivia Mackieressa, TS to follow up with the final results.  Walter Canninghomas Ersie Savino, MA Triage Specialist 03/28/2014 @ 16:40
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
## 2014-03-28 NOTE — Progress Notes (Signed)
CSW was notified by nurse that patient has been accepted into [facility], but will not be transported tonight. CSW called [facility] to notify hospital that the patient would not be transported tonight. However, [facility] did not answer the phone. CSW Left message instructing nurse to call CSW back at [phone].  CSW will try and call back hospital again.  Trish MageBrittney Karalina Tift, LCSWA [phone] ED CSW 03/28/2014 8:42 PM
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
## 2014-03-28 NOTE — ED Notes (Signed)
Patient was accepted at [facility].  Patient exhibited unsteadiness ambulating, poor thought processes.  Patient sweating and tremulous.  Ativan 2 mg injection administered.  Decision was made by NP to cancel discharge today for further observation.  Also EKG was unable to be completed due patient's status.  Also UDS needs to be completed.  CT scan complete.

## 2014-03-28 NOTE — ED Notes (Signed)
Patient has been quietly lying in bed watching TV.  He appears to be thought blocking.  His responses are delayed and slow.  He states that he cannot remember when the last time he ate was.  Patient states he has been taking prescriptions from his son and daughter-in law.  He states he was taking vicodin, percocet and xanax. "They were giving them to me to help me sleep."  Patient is lethargic; his movement are slow.  He is very vauge when asked about SI/HI/AVH. He states he sometime hears, "airplanes, cars and sometimes voices."  Attempting to get patient a bed at [facility] for further inpatient treatment.

## 2014-03-29 ENCOUNTER — Inpatient Hospital Stay (HOSPITAL_COMMUNITY)
Admission: AD | Admit: 2014-03-29 | Discharge: 2014-04-05 | DRG: 885 | Disposition: A | Discharge status: Home / Self Care | Payer: Medicaid Other | Source: Intra-hospital | Attending: Psychiatry | Admitting: Psychiatry

## 2014-03-29 DIAGNOSIS — R45851 Suicidal ideations: Secondary | ICD-10-CM | POA: Diagnosis present

## 2014-03-29 DIAGNOSIS — G4700 Insomnia, unspecified: Secondary | ICD-10-CM | POA: Diagnosis present

## 2014-03-29 DIAGNOSIS — F323 Major depressive disorder, single episode, severe with psychotic features: Secondary | ICD-10-CM | POA: Diagnosis present

## 2014-03-29 DIAGNOSIS — F332 Major depressive disorder, recurrent severe without psychotic features: Secondary | ICD-10-CM | POA: Diagnosis present

## 2014-03-29 DIAGNOSIS — F339 Major depressive disorder, recurrent, unspecified: Secondary | ICD-10-CM | POA: Diagnosis present

## 2014-03-29 MED ORDER — FOLIC ACID 1 MG PO TABS
1.0000 mg | ORAL_TABLET | Freq: Every day | ORAL | Status: DC
Start: 2014-03-30 — End: 2014-04-05
  Administered 2014-03-30 – 2014-04-05 (×7): 1 mg via ORAL
  Filled 2014-03-29 (×7): qty 1
  Filled 2014-03-29: qty 14
  Filled 2014-03-29 (×2): qty 1
  Filled 2014-03-29: qty 14

## 2014-03-29 MED ORDER — ZOLPIDEM TARTRATE 5 MG PO TABS
5.0000 mg | ORAL_TABLET | Freq: Every evening | ORAL | Status: DC | PRN
  Administered 2014-03-29 – 2014-03-30 (×2): 5 mg via ORAL
  Filled 2014-03-29 (×2): qty 1

## 2014-03-29 MED ORDER — NICOTINE 21 MG/24HR TD PT24
21.0000 mg | MEDICATED_PATCH | Freq: Every day | TRANSDERMAL | Status: DC
  Filled 2014-03-29 (×7): qty 1

## 2014-03-29 MED ORDER — ACETAMINOPHEN 325 MG PO TABS
650.0000 mg | ORAL_TABLET | ORAL | Status: DC | PRN
  Administered 2014-03-31: 650 mg via ORAL
  Filled 2014-03-29: qty 2

## 2014-03-29 MED ORDER — LORAZEPAM 2 MG/ML IJ SOLN: 1.0000 mg | Freq: Four times a day (QID) | INTRAMUSCULAR | Status: DC | PRN

## 2014-03-29 MED ORDER — LORAZEPAM 1 MG PO TABS
1.0000 mg | ORAL_TABLET | Freq: Four times a day (QID) | ORAL | Status: DC | PRN
  Administered 2014-03-29: 1 mg via ORAL

## 2014-03-29 MED ORDER — LORAZEPAM 1 MG PO TABS
0.0000 mg | ORAL_TABLET | Freq: Four times a day (QID) | ORAL | Status: DC
Start: 2014-03-30 — End: 2014-03-30
  Administered 2014-03-30: 1 mg via ORAL
  Filled 2014-03-29: qty 2
  Filled 2014-03-29: qty 1

## 2014-03-29 MED ORDER — ALUM & MAG HYDROXIDE-SIMETH 200-200-20 MG/5ML PO SUSP: 30.0000 mL | ORAL | Status: DC | PRN

## 2014-03-29 MED ORDER — ADULT MULTIVITAMIN W/MINERALS CH
1.0000 | ORAL_TABLET | Freq: Every day | ORAL | Status: DC
Start: 2014-03-30 — End: 2014-04-05
  Administered 2014-03-30 – 2014-04-05 (×7): 1 via ORAL
  Filled 2014-03-29 (×3): qty 1
  Filled 2014-03-29: qty 14
  Filled 2014-03-29 (×2): qty 1
  Filled 2014-03-29: qty 14
  Filled 2014-03-29 (×3): qty 1

## 2014-03-29 MED ORDER — IBUPROFEN 600 MG PO TABS
600.0000 mg | ORAL_TABLET | Freq: Three times a day (TID) | ORAL | Status: DC | PRN
  Administered 2014-03-29: 600 mg via ORAL
  Filled 2014-03-29: qty 1

## 2014-03-29 MED ORDER — LORAZEPAM 1 MG PO TABS
0.0000 mg | ORAL_TABLET | Freq: Two times a day (BID) | ORAL | Status: DC
Start: 2014-03-31 — End: 2014-03-30

## 2014-03-29 MED ORDER — PERPHENAZINE 2 MG PO TABS
2.0000 mg | ORAL_TABLET | Freq: Two times a day (BID) | ORAL | Status: DC
  Administered 2014-03-29 – 2014-03-31 (×4): 2 mg via ORAL
  Filled 2014-03-29 (×7): qty 1

## 2014-03-29 MED ORDER — THIAMINE HCL 100 MG/ML IJ SOLN: 100.0000 mg | Freq: Every day | INTRAMUSCULAR | Status: DC

## 2014-03-29 MED ORDER — ONDANSETRON HCL 4 MG PO TABS: 4.0000 mg | ORAL_TABLET | Freq: Three times a day (TID) | ORAL | Status: DC | PRN

## 2014-03-29 MED ORDER — VITAMIN B-1 100 MG PO TABS
100.0000 mg | ORAL_TABLET | Freq: Every day | ORAL | Status: DC
Start: 2014-03-30 — End: 2014-04-05
  Administered 2014-03-30 – 2014-04-05 (×7): 100 mg via ORAL
  Filled 2014-03-29 (×5): qty 1
  Filled 2014-03-29: qty 14
  Filled 2014-03-29: qty 1
  Filled 2014-03-29: qty 14
  Filled 2014-03-29 (×3): qty 1

## 2014-03-29 MED ORDER — MIRTAZAPINE 15 MG PO TABS
15.0000 mg | ORAL_TABLET | Freq: Every day | ORAL | Status: DC
  Administered 2014-03-29 – 2014-03-30 (×2): 15 mg via ORAL
  Filled 2014-03-29 (×4): qty 1

## 2014-03-29 MED ORDER — DIPHENHYDRAMINE HCL 25 MG PO CAPS
25.0000 mg | ORAL_CAPSULE | Freq: Two times a day (BID) | ORAL | Status: DC
  Administered 2014-03-30 (×2): 25 mg via ORAL
  Filled 2014-03-29 (×5): qty 1

## 2014-03-29 MED ORDER — ENSURE COMPLETE PO LIQD
237.0000 mL | Freq: Two times a day (BID) | ORAL | Status: DC
  Administered 2014-03-30 – 2014-04-05 (×5): 237 mL via ORAL
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
## 2014-03-29 NOTE — ED Notes (Signed)
Dr J and Josephine NP into see 
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
## 2014-03-29 NOTE — ED Notes (Signed)
Pt eating crackers/peanut butter

## 2014-03-29 NOTE — ED Notes (Signed)
[employer] contacted for transport 

## 2014-03-29 NOTE — ED Notes (Signed)
Up to the bathroom ambulatory w/o difficulty 

## 2014-03-29 NOTE — ED Notes (Signed)
Pt's son into see and is aware that the pt [facility]

## 2014-03-29 NOTE — Progress Notes (Signed)
Patient continues to be on OV waitlist per [place].  OV reports will not have any discharges until Monday.   Adelene AmasEdith Kalle[place], LCSW Disposition Social Worker [phone]

## 2014-03-29 NOTE — [facility] Counselor (Signed)
Declined by [facility] because they believe he is experiencing more of a "benzo delirium" and they don't believe they are a good fit.   Kateri PlummerKristin Zayyan Mullen, M.S., LPCA, [facility] Licensed Professional Counselor Associate  Triage Specialist  [facility]  Therapeutic Triage Services Phone: [phone] Fax: [phone]

## 2014-03-29 NOTE — ED Notes (Addendum)
Pt ambulatory w/o difficulty to [facility] w/ [facility], belongings given to [employer] 

## 2014-03-29 NOTE — BH Assessment (Signed)
[facility] with Ke[facility] at [facility] to inquire about pt's admission status. Spoke with Rene Kocheregina at [facility] who reported that pt was initially accepted however pt was cancelled due to having acute withdrawal symptoms possibly due to benzos. She reported that once pt was stable he could be referred to the [facility]. TTS will resubmit referral.

## 2014-03-29 NOTE — ED Notes (Addendum)
Pt resting quietly, reports he was "dreaming...haven't dreampt in a long time.Walter Holder.Walter Holder."

## 2014-03-29 NOTE — ED Notes (Addendum)
Up to the bathroom ambulatory w/o difficulty 

## 2014-03-29 NOTE — BH Assessment (Signed)
Binnie RailJoann Glover, [facility], confirms adult unit is currently at capacity. Contacted the following following facilities for placement:  BED AVAILABLE, FAXED CLINICAL INFORMATION: [facility], per Keema  PT ACCEPTED TO WAIT LIST: [facility], per Annice PihJackie  AT CAPACITY: [facility], per [facility], per [facility], [facility], per [facility], per [facility], per [facility], per [facility], per [facility], per [facility], per [facility], per [facility] [place], per [facility], per [facility], per Claudine  NO RESPONSE: [facility]   [address]Ford Ellis Patsy BaltimoreWarrick Jr, WisconsinLPC, [facility] Triage Specialist [phone]

## 2014-03-29 NOTE — ED Notes (Signed)
Resting quietly, nad, oriented x3. Pt reports that he does not feel as confused as yesterday.

## 2014-03-29 NOTE — ED Notes (Signed)
Pt's son updated on pt's admission

## 2014-03-29 NOTE — ED Notes (Signed)
Eating breakfast 

## 2014-03-29 NOTE — ED Notes (Addendum)
Pt's son reports that the patient has a hx of problems w/ benzo's and has been admitted for it in the past.

## 2014-03-30 ENCOUNTER — Encounter (HOSPITAL_COMMUNITY): Payer: Self-pay | Admitting: *Deleted

## 2014-03-30 DIAGNOSIS — R45851 Suicidal ideations: Secondary | ICD-10-CM

## 2014-03-30 DIAGNOSIS — F323 Major depressive disorder, single episode, severe with psychotic features: Secondary | ICD-10-CM

## 2014-03-30 MED ORDER — ZIPRASIDONE HCL 20 MG PO CAPS
ORAL_CAPSULE | ORAL | Status: AC
  Administered 2014-03-30
  Filled 2014-03-30: qty 1

## 2014-03-30 MED ORDER — LOPERAMIDE HCL 2 MG PO CAPS: 2.0000 mg | ORAL_CAPSULE | ORAL | Status: AC | PRN

## 2014-03-30 MED ORDER — LORAZEPAM 1 MG PO TABS
1.0000 mg | ORAL_TABLET | Freq: Two times a day (BID) | ORAL | Status: AC
  Administered 2014-04-01 (×2): 1 mg via ORAL
  Filled 2014-03-30 (×2): qty 1

## 2014-03-30 MED ORDER — LORAZEPAM 1 MG PO TABS
1.0000 mg | ORAL_TABLET | Freq: Three times a day (TID) | ORAL | Status: AC
  Administered 2014-03-31 (×3): 1 mg via ORAL
  Filled 2014-03-30 (×4): qty 1

## 2014-03-30 MED ORDER — ONDANSETRON 4 MG PO TBDP: 4.0000 mg | ORAL_TABLET | Freq: Four times a day (QID) | ORAL | Status: AC | PRN

## 2014-03-30 MED ORDER — HYDROXYZINE HCL 25 MG PO TABS: 25.0000 mg | ORAL_TABLET | Freq: Four times a day (QID) | ORAL | Status: AC | PRN

## 2014-03-30 MED ORDER — ZIPRASIDONE HCL 20 MG PO CAPS
20.0000 mg | ORAL_CAPSULE | Freq: Once | ORAL | Status: AC
  Filled 2014-03-30: qty 1

## 2014-03-30 MED ORDER — LORAZEPAM 1 MG PO TABS
1.0000 mg | ORAL_TABLET | Freq: Four times a day (QID) | ORAL | Status: AC
  Administered 2014-03-30 (×4): 1 mg via ORAL
  Filled 2014-03-30 (×4): qty 1

## 2014-03-30 MED ORDER — LORAZEPAM 1 MG PO TABS
1.0000 mg | ORAL_TABLET | Freq: Four times a day (QID) | ORAL | Status: AC | PRN
  Administered 2014-03-31: 1 mg via ORAL
  Filled 2014-03-30: qty 1

## 2014-03-30 MED ORDER — LORAZEPAM 1 MG PO TABS
1.0000 mg | ORAL_TABLET | Freq: Every day | ORAL | Status: AC
  Administered 2014-04-02: 1 mg via ORAL
  Filled 2014-03-30: qty 1
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
## 2014-03-30 NOTE — Progress Notes (Signed)
[facility] Group Notes:  (Nursing/MHT/Case Management/Adjunct)  Date:  03/30/2014  Time:  5:32 PM  Type of Therapy:  Psychoeducational Skills  Participation Level:  Active  Participation Quality:  Intrusive  Affect:  Labile  Cognitive:  Lacking  Insight:  Lacking  Engagement in Group:  Distracting, Lacking, Off Topic and Poor  Modes of Intervention:  Activity  Summary of Progress/Problems:  Caswell CorwinOwen, Lulla Linville C 03/30/2014, 5:32 PM

## 2014-03-30 NOTE — Progress Notes (Signed)
Patient ID: Walter SonsDavid B Binion, male   DOB: 11/08/1955, 59 y.o.   MRN: [id number]  The focus of this group is to educate the patient on the purpose and policies of crisis stabilization and provide a format to answer questions about their admission. The group details unit policies and expectations of patients while admitted. Writer discussed filling out self inventory and the importance of it for patient's care. Patient's were also asked to discuss how they were feeling this morning.  Patient attended group and reports he is feeling "middle of the road" today. Patient reports his anxiety is high, patient reports it is better than last night but he believes it will increase throughout the day.
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
## 2014-03-30 NOTE — Progress Notes (Signed)
Patient ID: Walter Holder, male   DOB: 11/14/1955, 59 y.o.   MRN: [id number]   D: Pt has been very bizarre on the unit, when this writer attempted to talk to patient it was like word salad. Pt has very disorganized thinking and does not have any insight. Pt was coming up to nursinig stating things like, please give me a razor the would act like he was cutting his throat. When patient was questioned he reported that he just wanted to shave, and if he was going to kill himself he would grab a gun. Patient would then leave nursing station laughing. Pt appears to be having some withdrawals, Ativan provided per protocol.  Pt reported being negative SI/HI, no AH/VH noted. A: 15 min checks continued for patient safety. R: Pt safety maintained.

## 2014-03-30 NOTE — [facility] Group Notes (Signed)
[facility] Group Notes:  (Nursing/MHT/Case Management/Adjunct)  Date:  03/30/2014  Time:  2:28 PM  Type of Therapy:  Psychoeducational Skills  Participation Level:  Active  Participation Quality:  Attentive and Sharing  Affect:  Anxious  Cognitive:  Alert  Insight:  Lacking  Engagement in Group:  Engaged  Modes of Intervention:  Discussion and Education  Summary of Progress/Problems: The purpose of this group is to discuss healthy support systems and different ways support systems can aid patients in recovery. Patient's also are asked to state a goal.   Patient reports his goal for the day is to shave and to take a shower. Patient is negative during this group. Writer and RN spoke to patient about ways to use positive support systems to help have positive thoughts. We also spoke about writing down when negative thoughts occur and changing them.   Jozalyn Baglio E 03/30/2014, 2:28 PM

## 2014-03-30 NOTE — [facility] Group Notes (Signed)
[facility] LCSW Group Therapy  03/30/2014   10:00 AM   Type of Therapy:  Group Therapy  Participation Level:  Active  Participation Quality:  Appropriate and Attentive  Affect:  Appropriate, Bright  Cognitive:  Alert and Appropriate  Insight:  Developing/Improving and Engaged  Engagement in Therapy:  Developing/Improving and Engaged  Modes of Intervention:  Clarification, Confrontation, Discussion, Education, Exploration, Limit-setting, Orientation, Problem-solving, Rapport Building, Dance movement psychotherapisteality Testing, Socialization and Support  Summary of Progress/Problems: The main focus of today's process group was to identify the patient's current support system and decide on other supports that can be put in place.  An emphasis was placed on using counselor, doctor, therapy groups, 12-step groups, and problem-specific support groups to expand supports, as well as doing something different than has been done before. Pt was an active participant of group discussion but not appropriately.  Pt would pick words out of what other people said, such as "digging up my past" and turn it into digging up bombs.  Pt also discussed drugs and how he believes government can stop the drug trade.  With redirection, pt shared that his wife, son and daughter are supportive but his son and daughter have mental health issues of their own but won't get help.     Reyes IvanChelsea Horton, LCSW 03/30/2014 12:55 PM

## 2014-03-31 DIAGNOSIS — F1190 Opioid use, unspecified, uncomplicated: Secondary | ICD-10-CM

## 2014-03-31 DIAGNOSIS — F1290 Cannabis use, unspecified, uncomplicated: Secondary | ICD-10-CM

## 2014-03-31 DIAGNOSIS — F1390 Sedative, hypnotic, or anxiolytic use, unspecified, uncomplicated: Secondary | ICD-10-CM

## 2014-03-31 MED ORDER — CLONIDINE HCL 0.1 MG PO TABS: 0.1000 mg | ORAL_TABLET | Freq: Two times a day (BID) | ORAL | Status: DC | PRN

## 2014-03-31 MED ORDER — SERTRALINE HCL 25 MG PO TABS
25.0000 mg | ORAL_TABLET | Freq: Every day | ORAL | Status: DC
  Administered 2014-03-31 – 2014-04-01 (×2): 25 mg via ORAL
  Filled 2014-03-31 (×4): qty 1

## 2014-03-31 MED ORDER — OLANZAPINE 5 MG PO TBDP
5.0000 mg | ORAL_TABLET | Freq: Every day | ORAL | Status: DC
  Administered 2014-03-31 – 2014-04-04 (×5): 5 mg via ORAL
  Filled 2014-03-31 (×6): qty 1
  Filled 2014-03-31 (×2): qty 14
  Filled 2014-03-31: qty 1

## 2014-03-31 MED ORDER — TEMAZEPAM 15 MG PO CAPS
15.0000 mg | ORAL_CAPSULE | Freq: Every day | ORAL | Status: DC
  Administered 2014-03-31 – 2014-04-03 (×4): 15 mg via ORAL
  Filled 2014-03-31 (×4): qty 1

## 2014-03-31 MED ORDER — BENZTROPINE MESYLATE 0.5 MG PO TABS
0.5000 mg | ORAL_TABLET | Freq: Every day | ORAL | Status: DC
  Administered 2014-03-31 – 2014-04-04 (×5): 0.5 mg via ORAL
  Filled 2014-03-31 (×2): qty 1
  Filled 2014-03-31 (×2): qty 14
  Filled 2014-03-31 (×5): qty 1

## 2014-03-31 NOTE — Progress Notes (Addendum)
Patient ID: Walter Holder, male   DOB: 04/02/1955, 59 y.o.   MRN: [id number]  Pt alert and oriented X4. Pt reports that he knows about the medication he was given yesterday and the reason behind it. Pt reports "feeling bad" but seems to be less confused this afternoon.

## 2014-03-31 NOTE — Progress Notes (Signed)
NUTRITION ASSESSMENT  Pt identified as at risk on the Malnutrition Screen Tool  INTERVENTION: 1. Educated patient on the importance of nutrition and encouraged intake of food and beverages. 2. Discussed weight goals. 3. Supplements: Continue Ensure Complete po BID, each supplement provides 350 kcal and 13 grams of protein  NUTRITION DIAGNOSIS: Unintentional weight loss related to sub-optimal intake as evidenced by pt report.   Goal: Pt to meet >/= 90% of their estimated nutrition needs.  Monitor:  PO intake  Assessment:  Pt admitted with depression, confusion, and opiate/benzo dependence.  Pt states that he is eating better here. His appetite has improved. States that PTA he was not eating , sometimes going weeks at a time without eating anything. Over the past 3 months, pt states he has lost 30 lb. UBW of 220 lb (14% weight loss x 3 months, significant for time frame).  Pt meets criteria for severe MALNUTRITION in the context of chronic illness as evidenced by energy intake <75% for >1 month and 14% weight loss x 3 months.  Pt would like to continue to receive the Ensure supplements provided.  Height: Ht Readings from Last 1 Encounters:  03/29/14 5\' 9"  (1.753 m)    Weight: Wt Readings from Last 1 Encounters:  03/29/14 189 lb (85.73 kg)    Weight Hx: Wt Readings from Last 10 Encounters:  03/29/14 189 lb (85.73 kg)  02/08/14 200 lb (90.719 kg)  02/28/12 225 lb (102.059 kg)    BMI:  Body mass index is 27.9 kg/(m^2). Pt meets criteria for overweight based on current BMI.  Estimated Nutritional Needs: Kcal: 25-30 kcal/kg Protein: > 1 gram protein/kg Fluid: 1 ml/kcal  Diet Order: Diet regular Pt is also offered choice of unit snacks mid-morning and mid-afternoon.  Pt is eating as desired.   Lab results and medications reviewed.   Tilda FrancoLindsey Danilo Cappiello, MS, RD, LDN Pager: [phone] After Hours Pager: [phone]

## 2014-03-31 NOTE — Plan of Care (Signed)
Problem: Diagnosis: Increased Risk For Suicide Attempt Goal: LTG-Patient Will Show Positive Response to Medication LTG (by discharge) : Patient will show positive response to medication and will participate in the development of the discharge plan.  Outcome: Progressing Pt shows signs of decreased anxiety

## 2014-03-31 NOTE — Progress Notes (Signed)
Adult Psychoeducational Group Note  Date:  03/31/2014 Time:  11:22 PM  Group Topic/Focus:  Wrap-Up Group:   The focus of this group is to help patients review their daily goal of treatment and discuss progress on daily workbooks.  Participation Level:  Did not attend. Pt was in bed sleeping.  Everrett Coombewen, Chamari Cutbirth C 03/31/2014, 11:22 PM

## 2014-03-31 NOTE — Progress Notes (Addendum)
Patient ID: Walter Holder, male   DOB: 04/22/1955, 59 y.o.   MRN: [id number]  Pt currently presents with a flat affect and confused, anxious behavior. Pt is oriented to person, time but not to place. Pt asks "what happened to the house?" upon waking up this morning. Pt takes medications and goes off the unit to meals and recreation. Pt slept today until 1030. Pt reports feeling confused about "why the man in the coat and dark hat kept asking me to sit down last night." Pt reports depression at a 9, hopelessness 8 or 9 and anxiety 9. Pt wants "to see my son again."   Pt provided with medications per providers orders. Pt's labs and vitals were monitored throughout the day. Pt supported emotionally and encouraged to express concerns and questions. Pt educated on medications. Pt oriented to place and to unit rules. Pt given an opportunity to ask questions about care.   Pt's safety ensured with 15 minute and environmental checks. Pt currently denies SI/HI and A/V hallucinations. Pt verbally agrees to seek staff if SI/HI or A/VH occurs and to consult with staff before acting on these thoughts. Pt endorses some confusion even with reorientation.
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
## 2014-03-31 NOTE — [facility] Group Notes (Signed)
[facility] LCSW Group Therapy  03/31/2014 1:33 PM  Type of Therapy:  Group Therapy  Participation Level:  Minimal  Participation Quality:  Attentive  Affect:  Depressed and Flat  Cognitive:  Alert  Insight:  Improving  Engagement in Therapy:  Improving  Modes of Intervention:  Confrontation, Discussion, Education, Exploration, Problem-solving, Rapport Building, Socialization and Support  Summary of Progress/Problems: Today's Topic: Overcoming Obstacles. Pt identified obstacles faced currently and processed barriers involved in overcoming these obstacles. Pt identified steps necessary for overcoming these obstacles and explored motivation (internal and external) for facing these difficulties head on. Pt further identified one area of concern in their lives and chose a skill of focus pulled from their "toolbox." Onalee HuaDavid was attentive and engaged during today's processing group. He shared that he has struggled to have an appetite and eat when stressed out or overwhelmed, but managed to eat all of this lunch today and yesterday. Onalee HuaDavid talked about various family stressors that have led to his mental health decline and subsequent hospitalization. "My current obstacle is finding a medication that doesn't make me ramble and groggy." Onalee HuaDavid continues to demonstrate progress in the group setting with improved insight and mental clarity.    Smart, Izacc Demeyer LCSWA 03/31/2014, 1:33 PM
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
## 2014-03-31 NOTE — [facility] Group Notes (Signed)
[facility]   03/31/2014 11:41 AM  Participation Quality:  Invited-DID NOT ATTEND-pt in room resting. Per RN, pt did not get sleep last night and is being allowed time to sleep this morning.   Smart, American FinancialHeather LCSWA

## 2014-03-31 NOTE — Progress Notes (Signed)
Adult Psychoeducational Group Note  Date:  03/31/2014 Time:  11:52 AM  Group Topic/Focus:  Wellness Toolbox:   The focus of this group is to discuss various aspects of wellness, balancing those aspects and exploring ways to increase the ability to experience wellness.  Patients will create a wellness toolbox for use upon discharge.  Participation Level:  Did Not Attend  Participation Quality:    Affect:    Cognitive:    Insight:   Engagement in Group:    Modes of Intervention:    Additional Comments:  Pt did not attend group.  Marquis Lunchbrahim, Tamaka Sawin 03/31/2014, 11:52 AM
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
## 2014-03-31 NOTE — Progress Notes (Addendum)
Patient ID: Erin SonsDavid B Seat, male   DOB: 03/07/1956, 59 y.o.   MRN: [id number] D)  At the beginning of the shift, pt's wife and son were visiting him in his room with the door shut.  The wife was in a wheelchair and the son was pushing her.  They later came to the nursing station and asked questions about how he was doing, meds, etc.  Wife indicted she would be interested in family session and possibly discussing living arrangements.  States has been stressful having daughter living at home, but is worried about the 174 yo grandchild and unsure how to handle the situation, feels is main stressor for pt.    Pt had been sleepy and wanted to go to bed, came to med window after they left, wanted hs meds so he could go to bed and get some sleep, stated was tired.  Has been unable to go to sleep and has been unable to stay in bed for any length of time, getting up frequently.  NP here, ordered geodon 20 mg po, and later another ativan.  Pt remains awake, pleasantly confused and disoriented, restless and still unable to sleep.  MHT sitting with pt to encourage him to rest.   A)  Will continue to monitor for safety, continue POC R)  Safety maintained at this time.

## 2014-04-01 DIAGNOSIS — F4310 Post-traumatic stress disorder, unspecified: Secondary | ICD-10-CM

## 2014-04-01 LAB — LIPID PANEL
CHOL/HDL RATIO: 4.5 ratio
Cholesterol: 187 mg/dL (ref 0–200)
HDL: 42 mg/dL (ref >39)
LDL Cholesterol: 100 mg/dL — ABNORMAL HIGH (ref 0–99)
Triglycerides: 225 mg/dL — ABNORMAL HIGH (ref <150)
VLDL: 45 mg/dL — ABNORMAL HIGH (ref 0–40)

## 2014-04-01 LAB — HEMOGLOBIN A1C
Hgb A1c MFr Bld: 5.4 % (ref <5.7)
Mean Plasma Glucose: 108 mg/dL (ref <117)

## 2014-04-01 LAB — TSH: TSH: 0.806 u[IU]/mL (ref 0.350–4.500)

## 2014-04-01 MED ORDER — SERTRALINE HCL 50 MG PO TABS
50.0000 mg | ORAL_TABLET | Freq: Every day | ORAL | Status: DC
  Administered 2014-04-02: 50 mg via ORAL
  Filled 2014-04-01 (×2): qty 1

## 2014-04-01 NOTE — Progress Notes (Signed)
D: Patient presents with depressed affect and mood. He reported on the self inventory sheet that he's sleeping fair at night, good appetite, low energy level and poor ability to concentrate. Patient rates depression/feelings of hopelessness "8" and anxiety "6". Currently patient is in the dayroom, sitting watching television with no interaction with others. In compliance with all medications and tolerating them well.  A: Support and encouragement provided to patient. Scheduled medications given per MD orders. Maintain Q15 minute checks for safety.  R: Patient receptive. Denies SI/HI and AVH. Patient remains safe on the hall.

## 2014-04-01 NOTE — Progress Notes (Signed)
Adult Psychoeducational Group Note  Date:  04/01/2014 Time:  9:00 AM  Group Topic/Focus:  Morning Wellness Group  Participation Level:  Did Not Attend  Additional Comments: Patient was in bed resting during group.  Harold BarbanByrd, Tinslee Klare E 04/01/2014, 11:38 AM
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
## 2014-04-01 NOTE — [facility] Suicide Risk Assessment (Signed)
[facility] INPATIENT:  Family/Significant Other Suicide Prevention Education  Suicide Prevention Education:  Education Completed; Jose PersiaRosemarie Sisler (pt's wife) [phone] has been identified by the patient as the family member/significant other with whom the patient will be residing, and identified as the person(s) who will aid the patient in the event of a mental health crisis (suicidal ideations/suicide attempt).  With written consent from the patient, the family member/significant other has been provided the following suicide prevention education, prior to the and/or following the discharge of the patient.  The suicide prevention education provided includes the following:  Suicide risk factors  Suicide prevention and interventions  National Suicide Hotline telephone number  [facility] assessment telephone number  [facility] Emergency Assistance 911  [facility] and/or Residential Mobile Crisis Unit telephone number  Request made of family/significant other to:  Remove weapons (e.g., guns, rifles, knives), all items previously/currently identified as safety concern.    Remove drugs/medications (over-the-counter, prescriptions, illicit drugs), all items previously/currently identified as a safety concern.  The family member/significant other verbalizes understanding of the suicide prevention education information provided.  The family member/significant other agrees to remove the items of safety concern listed above.  Smart, Tayjah Lobdell LCSWA  04/01/2014, 2:30 PM

## 2014-04-01 NOTE — [facility] Group Notes (Signed)
Adult Psychoeducational Group Note  Date:  04/01/2014 Time:  10:05 PM  Group Topic/Focus:  Wrap-Up Group:   The focus of this group is to help patients review their daily goal of treatment and discuss progress on daily workbooks.  Participation Level:  Minimal  Participation Quality:  Appropriate  Affect:  Flat  Cognitive:  Appropriate  Insight: Good  Engagement in Group:  Limited  Modes of Intervention:  Discussion  Additional Comments:  Walter Holder said his day was okay.  His children visited him today.  His goal was to have Holder visit from his family.  He has no discharge plans yet.  He likes to fish and hunt.  Walter Holder, Walter Holder 04/01/2014, 10:05 PM
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
## 2014-04-01 NOTE — [facility] Group Notes (Signed)
[facility] Group Notes:  (Counselor/Nursing/MHT/Case Management/Adjunct)  04/01/2014 1:15PM  Type of Therapy:  Group Therapy  Participation Level:  Active  Participation Quality:  Appropriate  Affect:  Flat  Cognitive:  Oriented  Insight:  Improving  Engagement in Group:  Limited  Engagement in Therapy:  Limited  Modes of Intervention:  Discussion, Exploration and Socialization  Summary of Progress/Problems: The topic for group was balance in life.  Pt participated in the discussion about when their life was in balance and out of balance and how this feels.  Pt discussed ways to get back in balance and short term goals they can work on to get where they want to be. In response to questions about balance looks like for him, or what he needs to feel balanced, he replied "I'm not sure anymore."  Unable to elaborate.  Eyes closed. Avoiding interaction.   Walter Geraldorth, Walter Holder 04/01/2014 11:00 AM

## 2014-04-01 NOTE — Clinical Social Work Note (Signed)
CSW spoke with pt's wife, Jose PersiaRosemarie Jent [phone]) at length this afternoon for collateral info and to complete SPE. Pt's wife shared that pt has been exhibiting increasing memory issues for the past 3 years and has been unable to work for 2 years. Pt's wife is planning to take pt to [facility]. She verified that pt does not have insurance at this time and feels comfortable with pt going to [facility] for mental health needs. She reports that Onalee HuaDavid disclosed to her that he has been buying xanax off the street and has been self medicating for the past few years on and off. His wife is concerned about his memory issues and inability to manage stress and anxiety. She verbalized understanding of all SPE information and feels that pt is able to return home at discharge without concerns. CSW assured his wife that MD would be notified of ongoing and increasing memory problems during treatment team. Pt's wife requested call from doctor prior to pt's discharge in order to discuss this.   The Sherwin-WilliamsHeather Smart, LCSWA 04/01/2014 2:34 PM

## 2014-04-02 MED ORDER — SERTRALINE HCL 100 MG PO TABS
100.0000 mg | ORAL_TABLET | Freq: Every day | ORAL | Status: DC
  Administered 2014-04-03: 100 mg via ORAL
  Filled 2014-04-02 (×2): qty 1

## 2014-04-02 MED ORDER — BUPROPION HCL 75 MG PO TABS: 75.0000 mg | ORAL_TABLET | Freq: Every day | ORAL | Status: DC
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
## 2014-04-02 NOTE — [facility] Group Notes (Signed)
[facility] Note   04/02/2014 10:47 AM  Participation Quality:  Minimal   Mood/Affect:  Depressed and Flat  Depression Rating:  4-5  Anxiety Rating:  4-5  Thoughts of Suicide:  No Will you contract for safety?   NA  Current AVH:  No  Plan for Discharge/Comments:  Pt reports that he slept okay last night and feels alittle clearer today. Pt stated that meds are helping him sleep but reports some depression still. Pt reports that his family came to see him last night. He is [facility]able [facility] and plans to return home at d/c.   Transportation Means: family   Supports: wife/kids   Counselling psychologistmart, OncologistHeather LCSWA

## 2014-04-02 NOTE — Progress Notes (Signed)
Pt did not attend group this evening.  

## 2014-04-02 NOTE — [facility] Group Notes (Signed)
[facility] LCSW Group Therapy  04/02/2014 1:20 PM  Type of Therapy:  Group Therapy  Participation Level:  None  Participation Quality:  Attentive  Affect:  Flat  Cognitive:  Alert  Insight:  Limited  Engagement in Therapy:  Limited  Modes of Intervention:  Discussion, Education, Socialization and Support  Summary of Progress/Problems:Mental Health Association (MHA) speaker came to talk about his personal journey with substance abuse and mental illness. Group members were challenged to process ways by which to relate to the speaker. MHA speaker provided handouts and educational information pertaining to groups and services offered by the [facility]. Walter Holder attended group and stayed the entire time. He sat quietly and listened to the speaker.     Hyatt,Candace 04/02/2014, 1:20 PM

## 2014-04-02 NOTE — Progress Notes (Signed)
D: Patient in bed on approach. Pt denies SI/HI/AVH. Pt rated depression as 5 on a 0-10 scale. Pt interaction is minimal and forwarded little to writer. Pt denies any needs or concerns.   A: Met with pt 1:1. Medications administered as prescribed. Writer encouraged pt to discuss feelings and engage in group activities.   R: Patient is safe on the unit. He is complaint with medications. Pt did not attend evening wrap up group.   

## 2014-04-02 NOTE — Progress Notes (Signed)
D: Pt presents with flat affect and depressed mood. Pt reported feeling extremely depressed this morning. Pt forwarded little information this morning to Clinical research associatewriter. Pt has minimal interaction on the unit. Pt denies feeling suicidal this morning. Pt reported that he slept well last night.  Pt appears disheveled. Pt denies having any withdrawal symptoms.  A: Medications administered as ordered per MD. Verbal support given. Pt encouraged to attend groups. Pt encourage to report worsening symptoms. 15 minute checks performed for safety.  R: Pt safety maintained.

## 2014-04-03 DIAGNOSIS — F1110 Opioid abuse, uncomplicated: Secondary | ICD-10-CM

## 2014-04-03 DIAGNOSIS — F333 Major depressive disorder, recurrent, severe with psychotic symptoms: Secondary | ICD-10-CM

## 2014-04-03 MED ORDER — SERTRALINE HCL 25 MG PO TABS
125.0000 mg | ORAL_TABLET | Freq: Every day | ORAL | Status: DC
  Administered 2014-04-04 – 2014-04-05 (×2): 125 mg via ORAL
  Filled 2014-04-03: qty 70
  Filled 2014-04-03: qty 5
  Filled 2014-04-03: qty 70
  Filled 2014-04-03 (×3): qty 5

## 2014-04-03 NOTE — Progress Notes (Signed)
D: Client in bed, reports depression at "7" of 10. Client up on phone, no interaction noted with peers. A: Writer introduced self to client, encouraged group, reviewed medications administered as ordered. Staff will monitor q7015min for safety. R: Client is safe on the unit, he did not attend group.

## 2014-04-03 NOTE — Clinical Social Work Note (Addendum)
Pt's wife called for update. CSW informed pt's wife that pt d/c date tentatively set for Saturday. Pt's wife is willing to pick him up at d/c. [facility] and CSW/pt's wife discussed importance of pt not self-medicating with benzos/alcohol/anything not prescribed by his doctor in the future in order to avoid further memory loss, mental health, and physical health problems. Pt's wife verbalized understanding and asked for medications that pt is currently prescribed.  The Sherwin-WilliamsHeather Smart, LCSWA 04/03/2014 2:07 PM

## 2014-04-03 NOTE — [facility] Group Notes (Signed)
[facility] LCSW Group Therapy  04/03/2014 12:31 PM   Type of Therapy:  Group Therapy  Participation Level:  Active  Participation Quality:  Attentive  Affect:  Appropriate  Cognitive:  Appropriate  Insight:  Improving  Engagement in Therapy:  Engaged  Modes of Intervention:  Clarification, Education, Exploration and Socialization  Summary of Progress/Problems: Today's group focused on relapse prevention.  We defined the term, and then brainstormed on ways to prevent relapse.  "I agree with what he was saying.  But I don't know what he said now.  I am having a hard time with keeping track of dates and times."  Admitted that relapse means going backwards, and that he has experienced a lot of that recently.  Also admitted that he is currently not capable of putting together a recovery plan, and furthermore is unable to identify anyone that he trusts to help with that.  Presents as hopeless and helpless.  Walter Holder, [name] 04/03/2014 , 12:31 PM

## 2014-04-03 NOTE — Plan of Care (Signed)
Problem: Food- and Nutrition-Related Knowledge Deficit (NB-1.1) Goal: Nutrition education Formal process to instruct or train a patient/client in a skill or to impart knowledge to help patients/clients voluntarily manage or modify food choices and eating behavior to maintain or improve health. Outcome: Completed/Met Date Met:  04/03/14 Nutrition Education Note  RD consulted for nutrition education regarding hyperlipidemia.  Lipid Panel     Component Value Date/Time    CHOL 187 04/01/2014 0635    TRIG 225* 04/01/2014 0635    HDL 42 04/01/2014 0635    CHOLHDL 4.5 04/01/2014 0635    VLDL 45* 04/01/2014 0635    LDLCALC 100* 04/01/2014 0635    RD provided "High Triglycerides Nutrition Therapy" handout from the Academy of Nutrition and Dietetics. Reviewed patient's dietary recall. Provided examples on ways to decrease sugar and fat intake in diet. Discouraged intake of processed foods and sweetened beverages. Encouraged fresh fruits and vegetables as well as whole grain sources of carbohydrates to maximize fiber intake. Encouraged increased physical activity. Teach back method used.  Expect fair compliance.  Body mass index is 27.9 kg/(m^2). Pt meets criteria for overweight based on current BMI.  Labs and medications reviewed. No further nutrition interventions warranted at this time. If additional nutrition issues arise, please re-consult RD.  Clayton Bibles, MS, RD, LDN Pager: [phone] After Hours Pager: [phone]

## 2014-04-03 NOTE — Plan of Care (Signed)
Problem: Ineffective individual coping Goal: STG: Patient will remain free from self harm Outcome: Completed/Met Date Met:  04/03/14 Pt is safe on unit currently

## 2014-04-03 NOTE — Plan of Care (Signed)
Problem: Alteration in mood Goal: STG-Patient is able to discuss feelings and issues (Patient is able to discuss feelings and issues leading to depression)  Outcome: Not Progressing Pt appeared depressed and rates as 5 on a 0-10 scale
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
## 2014-04-03 NOTE — Plan of Care (Signed)
Problem: Diagnosis: Increased Risk For Suicide Attempt Goal: LTG-Patient Will Report Absence of Withdrawal Symptoms LTG (by discharge): Patient will report absence of withdrawal symptoms.  Outcome: Completed/Met Date Met:  04/03/14 Pt reports no symptoms of withdrawal. CIWA 0.

## 2014-04-03 NOTE — Progress Notes (Signed)
Patient did not attend the evening karaoke group. Pt remained in bed during group time.

## 2014-04-04 MED ORDER — TEMAZEPAM 15 MG PO CAPS
30.0000 mg | ORAL_CAPSULE | Freq: Every day | ORAL | Status: DC
Start: 2014-04-04 — End: 2014-04-05
  Administered 2014-04-04: 30 mg via ORAL
  Filled 2014-04-04: qty 2

## 2014-04-04 NOTE — Clinical Social Work Note (Signed)
CSW contacted pt's wife to remind her of d/c tomorrow and to update her on pt status. Pt's wife will pick him up "after 1:30PM" on Saturday. No concerns regarding d/c.   The Sherwin-WilliamsHeather Smart, LCSWA 04/04/2014 2:16 PM

## 2014-04-04 NOTE — [facility] Group Notes (Signed)
[facility] LCSW Group Therapy  04/04/2014  1:05 PM  Type of Therapy:  Group therapy  Participation Level:  Active  Participation Quality:  Attentive  Affect:  Flat  Cognitive:  Oriented  Insight:  Limited  Engagement in Therapy:  Limited  Modes of Intervention:  Discussion, Socialization  Summary of Progress/Problems:  Chaplain was here to lead a group on themes of hope and courage.Invited.  Chose to not attend. Ida Rogueorth, Charron Coultas B 04/04/2014 12:34 PM

## 2014-04-04 NOTE — Plan of Care (Signed)
Problem: Alteration in mood & ability to function due to Goal: LTG-Pt reports reduction in suicidal thoughts (Patient reports reduction in suicidal thoughts and is able to verbalize a safety plan for whenever patient is feeling suicidal)  Outcome: Progressing Client denies suicidal ideations. "no"
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
## 2014-04-04 NOTE — [facility] Group Notes (Signed)
[facility]   04/04/2014 11:18 AM  Participation Quality:  Pt sleeping in room-invited to group/DID NOT ATTEND  Smart, Lebron QuamHeather LCSWA

## 2014-04-04 NOTE — Plan of Care (Signed)
Problem: Alteration in mood & ability to function due to Goal: STG-Patient will comply with prescribed medication regimen (Patient will comply with prescribed medication regimen)  Outcome: Progressing Client will continue to follow medication regimen, with no side effects. Client reports of medications "It's working I guess" rates depression as "7" of 10.

## 2014-04-04 NOTE — Progress Notes (Signed)
Did not attend group 

## 2014-04-04 NOTE — Progress Notes (Signed)
[facility] :  Will you be returning to the same living situation after discharge: Yes,  home with his wife At discharge, do you have transportation home?:Yes,  wife coming after lunch on SAT 04/05/14 Do you have the ability to pay for your medications:Yes,  mental health  Release of information consent forms completed and submitted to Medical Records by CSW.  Patient to Follow up at: Follow-up Information    Follow up with [facility].   Why:  Walk in between 8am-9am Monday through Friday for hospital follow-up/medication management/assessment for therapy services.    Contact information:   [address] [place], [place] [address] Phone: [phone] Fax: [phone]      Patient denies SI/HI:   Yes,  during self report and group.     Safety Planning and Suicide Prevention discussed:  Yes,  SPE completed with pt's wife. SPI pamphlet provided to pt and he was encouraged to share information with support network, ask questions, and talk about any concerns relating to SPE.  N/A patient is not a smoker  Smart, Lebron QuamHeather LCSWA  04/04/2014, 12:55 PM
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
## 2014-04-04 NOTE — Progress Notes (Addendum)
Patient ID: Walter Holder, male   DOB: 02/04/1956, 59 y.o.   MRN: [id number]   Pt currently presents with a flat affect and depressed behavior. Per self inventory, pt rates depression at a 8, hopelessness 5 and anxiety 5. Pt's daily goal is to "try and learn." When asked to elaborate, pt states " I dunno." Pt reports poor sleep, concentration and appetite. Pt reports anxiety. Pt refused any medication for anxiety. Pt guarded with minimal interaction with staff.   Pt provided with medications per providers orders. Pt's labs and vitals were monitored throughout the day. Pt supported emotionally and encouraged to express concerns and questions. Pt educated on medications and stress management.  Pt's safety ensured with 15 minute and environmental checks. Pt currently denies SI/HI and A/V hallucinations. Pt verbally agrees to seek staff if SI/HI or A/VH occurs and to consult with staff before acting on these thoughts. Pt to be discharged Saturday.

## 2014-04-05 DIAGNOSIS — F332 Major depressive disorder, recurrent severe without psychotic features: Secondary | ICD-10-CM

## 2014-04-05 MED ORDER — THIAMINE HCL 100 MG PO TABS: 100.0000 mg | ORAL_TABLET | Freq: Every day | ORAL | Status: DC

## 2014-04-05 MED ORDER — FOLIC ACID 1 MG PO TABS: 1.0000 mg | ORAL_TABLET | Freq: Every day | ORAL | Status: DC

## 2014-04-05 MED ORDER — SERTRALINE HCL 25 MG PO TABS: 125.0000 mg | ORAL_TABLET | Freq: Every day | ORAL | Status: DC

## 2014-04-05 MED ORDER — ADULT MULTIVITAMIN W/MINERALS CH: 1.0000 | ORAL_TABLET | Freq: Every day | ORAL | Status: DC

## 2014-04-05 MED ORDER — TEMAZEPAM 30 MG PO CAPS: 30.0000 mg | ORAL_CAPSULE | Freq: Every day | ORAL | Status: DC

## 2014-04-05 MED ORDER — BENZTROPINE MESYLATE 0.5 MG PO TABS: 0.5000 mg | ORAL_TABLET | Freq: Every day | ORAL | Status: DC

## 2014-04-05 MED ORDER — OLANZAPINE 5 MG PO TBDP: 5.0000 mg | ORAL_TABLET | Freq: Every day | ORAL | Status: DC
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
## 2014-04-05 NOTE — Progress Notes (Signed)
Discharge Note:  Patient discharged home with family member.  Denied SI and HI.  Denied A/V hallucinations.  Patient stated he received all his belongings, clothing, toiletries, misc items, prescriptions, medications.  Patient stated he [facility].

## 2014-04-05 NOTE — [facility] Group Notes (Signed)
[facility] LCSW Group Therapy 04/05/2014 1:15 PM   Type of Therapy: Group Therapy  Participation Level: Did Not Attend. Patient invited to participate but declined.   Quaniya Damas, MSW, LCSWA Clinical Social Worker [facility] [phone]  

## 2014-04-05 NOTE — Progress Notes (Signed)
D:  Patient denied SI and HI, contracts for safety.  Patient denied A/V hallucinations.   A:  Medications administered per MD orders.  Emotional support and encouragement given patient. R:  Safety maintained with 15 minute checks.  Patient has been sitting or laying in bed, not attending groups this morning.

## 2014-04-05 NOTE — Progress Notes (Signed)
D)  Pt has been in bed most of the evening, lying in the dark, affect is flat, sad, depressed, barely answers questions.  Son was here to visit, was asking about his father and if he had improved enough to come home. Voiced his concern about the stress his father has been dealing with.  Stated he could work enough to pay their bills, and wants to talk to his sister about moving out, as she is also a stressor to his father, and stated she has a place she can go.   Per RN reporting off, pt had become more depressed as the day progressed, when asked if he had gone to dinner, he said no, couldn't eat.  Was taken snacks and was given a sandwich box, all of which he nibbled on, but stated had no appetite.  Was compliant with meds.  When his wife called on the phone, he said to tell her he was too tired to come to the phone, kept his eyes closed. A)  Will continue to monitor for safety, support, encourage to talk about his feelings and what has triggered his regression.  Assist him to identify his concerns and try to help him address them so that he can be successful when he goes home  R)  Safety maintained.

## 2014-04-05 NOTE — Progress Notes (Signed)
The focus of this group is to educate the patient on the purpose and policies of crisis stabilization and provide a format to answer questions about their admission.  The group details unit policies and expectations of patients while admitted.  Patient did not attend nurse education orientation group this morning.  Patient stayed in his room.  
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
## 2014-04-09 NOTE — Progress Notes (Signed)
Patient Discharge Instructions:  After Visit Summary (AVS):   Faxed to:  04/09/14 Discharge Summary Note:   Faxed to:  04/09/14 Psychiatric Admission Assessment Note:   Faxed to:  04/09/14 Suicide Risk Assessment - Discharge Assessment:   Faxed to:  04/09/14 Faxed/Sent to the Next Level Care provider:  04/09/14 [facility] to [facility] @ [phone]  Jerelene ReddenSheena E Remington, 04/09/2014, 3:15 PM

## 2014-04-19 ENCOUNTER — Encounter (HOSPITAL_COMMUNITY): Payer: Self-pay | Admitting: *Deleted

## 2014-04-19 ENCOUNTER — Emergency Department (HOSPITAL_COMMUNITY)
Admission: EM | Admit: 2014-04-19 | Discharge: 2014-04-19 | Disposition: A | Discharge status: Home / Self Care | Payer: Self-pay | Attending: Emergency Medicine | Admitting: Emergency Medicine

## 2014-04-19 DIAGNOSIS — Z9889 Other specified postprocedural states: Secondary | ICD-10-CM | POA: Insufficient documentation

## 2014-04-19 DIAGNOSIS — Z79899 Other long term (current) drug therapy: Secondary | ICD-10-CM | POA: Insufficient documentation

## 2014-04-19 DIAGNOSIS — Z8679 Personal history of other diseases of the circulatory system: Secondary | ICD-10-CM | POA: Insufficient documentation

## 2014-04-19 DIAGNOSIS — Z8719 Personal history of other diseases of the digestive system: Secondary | ICD-10-CM | POA: Insufficient documentation

## 2014-04-19 DIAGNOSIS — G8929 Other chronic pain: Secondary | ICD-10-CM | POA: Insufficient documentation

## 2014-04-19 DIAGNOSIS — Z8739 Personal history of other diseases of the musculoskeletal system and connective tissue: Secondary | ICD-10-CM | POA: Insufficient documentation

## 2014-04-19 DIAGNOSIS — R338 Other retention of urine: Secondary | ICD-10-CM

## 2014-04-19 DIAGNOSIS — R339 Retention of urine, unspecified: Secondary | ICD-10-CM | POA: Insufficient documentation

## 2014-04-19 LAB — URINALYSIS, ROUTINE W REFLEX MICROSCOPIC
Glucose, UA: NEGATIVE mg/dL
Ketones, ur: NEGATIVE mg/dL
Leukocytes, UA: NEGATIVE
Nitrite: NEGATIVE
PH: 5.5 (ref 5.0–8.0)
PROTEIN: NEGATIVE mg/dL
SPECIFIC GRAVITY, URINE: 1.026 (ref 1.005–1.030)
UROBILINOGEN UA: 0.2 mg/dL (ref 0.0–1.0)

## 2014-04-19 LAB — CBC WITH DIFFERENTIAL/PLATELET
BASOS PCT: 0 % (ref 0–1)
Basophils Absolute: 0 10*3/uL (ref 0.0–0.1)
Eosinophils Absolute: 0.1 10*3/uL (ref 0.0–0.7)
Eosinophils Relative: 0 % (ref 0–5)
HCT: 44.3 % (ref 39.0–52.0)
Hemoglobin: 15.9 g/dL (ref 13.0–17.0)
Lymphocytes Relative: 13 % (ref 12–46)
Lymphs Abs: 1.5 10*3/uL (ref 0.7–4.0)
MCH: 31.2 pg (ref 26.0–34.0)
MCHC: 35.9 g/dL (ref 30.0–36.0)
MCV: 86.9 fL (ref 78.0–100.0)
Monocytes Absolute: 1 10*3/uL (ref 0.1–1.0)
Monocytes Relative: 9 % (ref 3–12)
NEUTROS PCT: 78 % — AB (ref 43–77)
Neutro Abs: 9 10*3/uL — ABNORMAL HIGH (ref 1.7–7.7)
Platelets: 226 10*3/uL (ref 150–400)
RBC: 5.1 MIL/uL (ref 4.22–5.81)
RDW: 13.8 % (ref 11.5–15.5)
WBC: 11.6 10*3/uL — ABNORMAL HIGH (ref 4.0–10.5)

## 2014-04-19 LAB — COMPREHENSIVE METABOLIC PANEL
ALT: 15 U/L (ref 0–53)
AST: 21 U/L (ref 0–37)
Albumin: 4.1 g/dL (ref 3.5–5.2)
Alkaline Phosphatase: 68 U/L (ref 39–117)
Anion gap: 11 (ref 5–15)
BUN: 11 mg/dL (ref 6–23)
CO2: 24 mmol/L (ref 19–32)
Calcium: 10 mg/dL (ref 8.4–10.5)
Chloride: 106 mmol/L (ref 96–112)
Creatinine, Ser: 0.97 mg/dL (ref 0.50–1.35)
GFR calc non Af Amer: 90 mL/min — ABNORMAL LOW (ref >90)
Glucose, Bld: 114 mg/dL — ABNORMAL HIGH (ref 70–99)
POTASSIUM: 3.6 mmol/L (ref 3.5–5.1)
Sodium: 141 mmol/L (ref 135–145)
TOTAL PROTEIN: 7.4 g/dL (ref 6.0–8.3)
Total Bilirubin: 0.9 mg/dL (ref 0.3–1.2)

## 2014-04-19 LAB — URINE MICROSCOPIC-ADD ON

## 2014-04-19 LAB — LIPASE, BLOOD: Lipase: 22 U/L (ref 11–59)

## 2014-04-19 NOTE — ED Provider Notes (Signed)
CSN: [id number]     Arrival date & time 04/19/14  1152 History   First MD Initiated Contact with Patient 04/19/14 1156     Chief Complaint  Patient presents with  . Abdominal Pain     Patient is a 59 y.o. male presenting with abdominal pain. The history is provided by the patient. No language interpreter was used.  Abdominal Pain  Mr. [name] presents for evaluation of lower abdominal pain. Level V Due to poor historian and confusion. He reports pain in his lower abdomen that and therefore, "a while." Pain is described as pain and it is constant in nature. He states that his heart PE and he can't only when he last urinated. He denies any fevers, chest pain, cough, vomiting, diarrhea. He reports feeling mildly confused. Symptoms are moderate, constant, worsening. He states that he has a history of alcohol abuse and he is not sure when he last drank.  Past Medical History  Diagnosis Date  . Complication of anesthesia     "my ears rang before I went out" (02/28/2012)  . C. difficile diarrhea ~ 2008; 02/28/2012  . Anginal pain ~ 2007  . GERD (gastroesophageal reflux disease)   . Chronic lower back pain   . Arthritis     "might be getting some" (02/28/2012)  . Stress at home     "from my daughter mostly" (02/28/2012)   Past Surgical History  Procedure Laterality Date  . Multiple tooth extractions  1990's    "3" (02/28/2012)  . Cardiac catheterization  ~ 2007   History reviewed. No pertinent family history. History  Substance Use Topics  . Smoking status: Never Smoker   . Smokeless tobacco: Never Used  . Alcohol Use: No     Comment: 02/28/2012 "5-6 beers on the weekends"    Review of Systems  Gastrointestinal: Positive for abdominal pain.  All other systems reviewed and are negative.     Allergies  Review of patient's allergies indicates no known allergies.  Home Medications   Prior to Admission medications   Medication Sig Start Date End Date Taking? Authorizing  Provider  benztropine (COGENTIN) 0.5 MG tablet Take 1 tablet (0.5 mg total) by mouth at bedtime. For prevention of drug induced tremors 04/05/14   Sanjuana KavaAgnes I Nwoko, NP  folic acid (FOLVITE) 1 MG tablet Take 1 tablet (1 mg total) by mouth daily. For low folate 04/05/14   Sanjuana KavaAgnes I Nwoko, NP  Multiple Vitamin (MULTIVITAMIN WITH MINERALS) TABS tablet Take 1 tablet by mouth daily. For low vitamin 04/05/14   Sanjuana KavaAgnes I Nwoko, NP  OLANZapine zydis (ZYPREXA) 5 MG disintegrating tablet Take 1 tablet (5 mg total) by mouth at bedtime. For mood control 04/05/14   Sanjuana KavaAgnes I Nwoko, NP  sertraline (ZOLOFT) 25 MG tablet Take 5 tablets (125 mg total) by mouth daily. For depression 04/05/14   Sanjuana KavaAgnes I Nwoko, NP  temazepam (RESTORIL) 30 MG capsule Take 1 capsule (30 mg total) by mouth at bedtime. For sleep 04/05/14   Sanjuana KavaAgnes I Nwoko, NP   BP 144/98 mmHg  Pulse 114  Temp(Src) 100.3 F (37.9 C) (Rectal)  Resp 20  Ht 5\' 9"  (1.753 m)  Wt 189 lb (85.73 kg)  BMI 27.90 kg/m2  SpO2 96% Physical Exam  Constitutional: He appears well-developed and well-nourished.  HENT:  Head: Normocephalic and atraumatic.  Cardiovascular:  Tachycardic, no murmur  Pulmonary/Chest: Effort normal and breath sounds normal. No respiratory distress.  Abdominal:  Lower abdominal tenderness and fullness without guarding or  rebound  Neurological: He is alert.  Disoriented to time, mildly confused, mildly tremulous  Skin: Skin is warm and dry.  Psychiatric: He has a normal mood and affect. His behavior is normal.  Nursing note and vitals reviewed.   ED Course  Procedures (including critical care time) Labs Review Labs Reviewed  CBC WITH DIFFERENTIAL/PLATELET - Abnormal; Notable for the following:    WBC 11.6 (*)    Neutrophils Relative % 78 (*)    Neutro Abs 9.0 (*)    All other components within normal limits  COMPREHENSIVE METABOLIC PANEL - Abnormal; Notable for the following:    Glucose, Bld 114 (*)    GFR calc non Af Amer 90 (*)    All  other components within normal limits  URINALYSIS, ROUTINE W REFLEX MICROSCOPIC - Abnormal; Notable for the following:    Color, Urine AMBER (*)    Hgb urine dipstick TRACE (*)    Bilirubin Urine SMALL (*)    All other components within normal limits  URINE CULTURE  LIPASE, BLOOD  URINE MICROSCOPIC-ADD ON    Imaging Review No results found.   EKG Interpretation None      MDM   Final diagnoses:  Acute urinary retention    Patient here for further abdominal pain and urinary retention. Patient's symptoms improved following in and out cath. UA is not consistent with urinary tract infection, cultures are pending. Repeat abdominal exam following catheterization reveals a soft and nontender abdomen. Discussed with patient and family home care for urinary retention as well as return precautions. Recommend discontinuing the Cogentin the patient was started on in the last month.    Tilden Fossa, MD 04/19/14 [id number]

## 2014-04-19 NOTE — ED Notes (Signed)
Family at bedside. 

## 2014-04-19 NOTE — ED Notes (Signed)
Per EMS- pt has had lower abdominal pain that extends to groin that started yesterday. Pt also reports difficulty urinating. Denies blood in urine. Pt reports hx of kidney stones.

## 2014-04-19 NOTE — Discharge Instructions (Signed)
Stop taking your benztropine (cogentin).  Get rechecked immediately if you are unable to urinate and develop abdominal pain.     Acute Urinary Retention Acute urinary retention is the temporary inability to urinate. This is a common problem in older men. As men age their prostates become larger and block the flow of urine from the bladder. This is usually a problem that has come on gradually.  HOME CARE INSTRUCTIONS If you are sent home with a Foley catheter and a drainage system, you will need to discuss the best course of action with your health care provider. While the catheter is in, maintain a good intake of fluids. Keep the drainage bag emptied and lower than your catheter. This is so that contaminated urine will not flow back into your bladder, which could lead to a urinary tract infection. There are two main types of drainage bags. One is a large bag that usually is used at night. It has a good capacity that will allow you to sleep through the night without having to empty it. The second type is called a leg bag. It has a smaller capacity, so it needs to be emptied more frequently. However, the main advantage is that it can be attached by a leg strap and can go underneath your clothing, allowing you the freedom to move about or leave your home. Only take over-the-counter or prescription medicines for pain, discomfort, or fever as directed by your health care provider.  SEEK MEDICAL CARE IF:  You develop a low-grade fever.  You experience spasms or leakage of urine with the spasms. SEEK IMMEDIATE MEDICAL CARE IF:   You develop chills or fever.  Your catheter stops draining urine.  Your catheter falls out.  You start to develop increased bleeding that does not respond to rest and increased fluid intake. MAKE SURE YOU:  Understand these instructions.  Will watch your condition.  Will get help right away if you are not doing well or get worse. Document Released: 06/13/2000 Document  Revised: 03/12/2013 Document Reviewed: 08/16/2012 [facility] Patient Information 2015 [employer] [place]. This information is not intended to replace advice given to you by your health care provider. Make sure you discuss any questions you have with your health care provider.

## 2014-04-20 LAB — URINE CULTURE
Colony Count: NO GROWTH
Culture: NO GROWTH

## 2014-04-21 ENCOUNTER — Encounter (HOSPITAL_COMMUNITY): Payer: Self-pay | Admitting: Emergency Medicine

## 2014-04-21 ENCOUNTER — Emergency Department (HOSPITAL_COMMUNITY)
Admission: EM | Admit: 2014-04-21 | Discharge: 2014-04-21 | Disposition: A | Discharge status: Home / Self Care | Payer: Self-pay | Attending: Emergency Medicine | Admitting: Emergency Medicine

## 2014-04-21 DIAGNOSIS — Z9889 Other specified postprocedural states: Secondary | ICD-10-CM | POA: Insufficient documentation

## 2014-04-21 DIAGNOSIS — M1990 Unspecified osteoarthritis, unspecified site: Secondary | ICD-10-CM | POA: Insufficient documentation

## 2014-04-21 DIAGNOSIS — Z8659 Personal history of other mental and behavioral disorders: Secondary | ICD-10-CM | POA: Insufficient documentation

## 2014-04-21 DIAGNOSIS — Z8619 Personal history of other infectious and parasitic diseases: Secondary | ICD-10-CM | POA: Insufficient documentation

## 2014-04-21 DIAGNOSIS — Z79899 Other long term (current) drug therapy: Secondary | ICD-10-CM | POA: Insufficient documentation

## 2014-04-21 DIAGNOSIS — R339 Retention of urine, unspecified: Secondary | ICD-10-CM | POA: Insufficient documentation

## 2014-04-21 DIAGNOSIS — Z8719 Personal history of other diseases of the digestive system: Secondary | ICD-10-CM | POA: Insufficient documentation

## 2014-04-21 DIAGNOSIS — G8929 Other chronic pain: Secondary | ICD-10-CM | POA: Insufficient documentation

## 2014-04-21 DIAGNOSIS — Z8679 Personal history of other diseases of the circulatory system: Secondary | ICD-10-CM | POA: Insufficient documentation

## 2014-04-21 LAB — BASIC METABOLIC PANEL
ANION GAP: 11 (ref 5–15)
BUN: 17 mg/dL (ref 6–23)
CALCIUM: 9.6 mg/dL (ref 8.4–10.5)
CO2: 26 mmol/L (ref 19–32)
CREATININE: 0.85 mg/dL (ref 0.50–1.35)
Chloride: 104 mmol/L (ref 96–112)
GFR calc Af Amer: >90 mL/min (ref >90)
Glucose, Bld: 98 mg/dL (ref 70–99)
Potassium: 3.7 mmol/L (ref 3.5–5.1)
SODIUM: 141 mmol/L (ref 135–145)

## 2014-04-21 LAB — URINALYSIS, ROUTINE W REFLEX MICROSCOPIC
GLUCOSE, UA: NEGATIVE mg/dL
KETONES UR: 15 mg/dL — AB
Nitrite: POSITIVE — AB
PH: 6 (ref 5.0–8.0)
Protein, ur: NEGATIVE mg/dL
Specific Gravity, Urine: 1.017 (ref 1.005–1.030)
Urobilinogen, UA: 0.2 mg/dL (ref 0.0–1.0)

## 2014-04-21 LAB — URINE MICROSCOPIC-ADD ON

## 2014-04-21 LAB — CBC WITH DIFFERENTIAL/PLATELET
BASOS ABS: 0 10*3/uL (ref 0.0–0.1)
Basophils Relative: 0 % (ref 0–1)
Eosinophils Absolute: 0.1 10*3/uL (ref 0.0–0.7)
Eosinophils Relative: 1 % (ref 0–5)
HCT: 44.2 % (ref 39.0–52.0)
Hemoglobin: 15.3 g/dL (ref 13.0–17.0)
Lymphocytes Relative: 21 % (ref 12–46)
Lymphs Abs: 2.1 10*3/uL (ref 0.7–4.0)
MCH: 31 pg (ref 26.0–34.0)
MCHC: 34.6 g/dL (ref 30.0–36.0)
MCV: 89.7 fL (ref 78.0–100.0)
Monocytes Absolute: 0.8 10*3/uL (ref 0.1–1.0)
Monocytes Relative: 8 % (ref 3–12)
NEUTROS PCT: 70 % (ref 43–77)
Neutro Abs: 7.1 10*3/uL (ref 1.7–7.7)
Platelets: 224 10*3/uL (ref 150–400)
RBC: 4.93 MIL/uL (ref 4.22–5.81)
RDW: 13.8 % (ref 11.5–15.5)
WBC: 10.1 10*3/uL (ref 4.0–10.5)

## 2014-04-21 MED ORDER — PROPOFOL 10 MG/ML IV BOLUS
0.5000 mg/kg | Freq: Once | INTRAVENOUS | Status: DC
  Filled 2014-04-21: qty 1

## 2014-04-21 MED ORDER — PROPOFOL 10 MG/ML IV EMUL
INTRAVENOUS | Status: DC | PRN
  Administered 2014-04-21 (×2): 2 mL via INTRAVENOUS
  Administered 2014-04-21: 4 mL via INTRAVENOUS
  Administered 2014-04-21 (×2): 2 mL via INTRAVENOUS

## 2014-04-21 NOTE — ED Notes (Signed)
Per EMS was here on the 30 th for same symptoms-family states he is not able to urinate

## 2014-04-21 NOTE — ED Notes (Signed)
Bed: WA15 Expected date:  Expected time:  Means of arrival:  Comments: EMS-urinary retention 

## 2014-04-21 NOTE — Discharge Instructions (Signed)
Leave catheter in place until followed up by urology.  Follow-up with [facility]. The contact information has been provided in this discharge summary. Call tomorrow to make this appointment.   Acute Urinary Retention Acute urinary retention is the temporary inability to urinate. This is a common problem in older men. As men age their prostates become larger and block the flow of urine from the bladder. This is usually a problem that has come on gradually.  HOME CARE INSTRUCTIONS If you are sent home with a Foley catheter and a drainage system, you will need to discuss the best course of action with your health care provider. While the catheter is in, maintain a good intake of fluids. Keep the drainage bag emptied and lower than your catheter. This is so that contaminated urine will not flow back into your bladder, which could lead to a urinary tract infection. There are two main types of drainage bags. One is a large bag that usually is used at night. It has a good capacity that will allow you to sleep through the night without having to empty it. The second type is called a leg bag. It has a smaller capacity, so it needs to be emptied more frequently. However, the main advantage is that it can be attached by a leg strap and can go underneath your clothing, allowing you the freedom to move about or leave your home. Only take over-the-counter or prescription medicines for pain, discomfort, or fever as directed by your health care provider.  SEEK MEDICAL CARE IF:  You develop a low-grade fever.  You experience spasms or leakage of urine with the spasms. SEEK IMMEDIATE MEDICAL CARE IF:   You develop chills or fever.  Your catheter stops draining urine.  Your catheter falls out.  You start to develop increased bleeding that does not respond to rest and increased fluid intake. MAKE SURE YOU:  Understand these instructions.  Will watch your condition.  Will get help right away if you  are not doing well or get worse. Document Released: 06/13/2000 Document Revised: 03/12/2013 Document Reviewed: 08/16/2012 [facility] Patient Information 2015 [place], [place]. This information is not intended to replace advice given to you by your health care provider. Make sure you discuss any questions you have with your health care provider.

## 2014-04-21 NOTE — ED Provider Notes (Signed)
CSN: [id number]     Arrival date & time 04/21/14  1256 History   First MD Initiated Contact with Patient 04/21/14 1404     Chief Complaint  Patient presents with  . Urinary Retention     (Consider location/radiation/quality/duration/timing/severity/associated sxs/prior Treatment) HPI Comments: Patient is 59 year old male with history of psychiatric issues, substance abuse issues who presents with complaints of urinary retention. He had medication adjustments recently due to stressors he has experienced at home. Since one of his medications was discontinued, he has had difficulty urinating. He was seen here 2 days ago and found to have 1500 mL of urine in his bladder. He returns today with lower abdominal distention and inability to void. He denies fevers or chills. He is somewhat of a difficult historian due to his behavioral issues.  The history is provided by the patient.    Past Medical History  Diagnosis Date  . Complication of anesthesia     "my ears rang before I went out" (02/28/2012)  . C. difficile diarrhea ~ 2008; 02/28/2012  . Anginal pain ~ 2007  . GERD (gastroesophageal reflux disease)   . Chronic lower back pain   . Arthritis     "might be getting some" (02/28/2012)  . Stress at home     "from my daughter mostly" (02/28/2012)   Past Surgical History  Procedure Laterality Date  . Multiple tooth extractions  1990's    "3" (02/28/2012)  . Cardiac catheterization  ~ 2007   No family history on file. History  Substance Use Topics  . Smoking status: Never Smoker   . Smokeless tobacco: Never Used  . Alcohol Use: No     Comment: 02/28/2012 "5-6 beers on the weekends"    Review of Systems  All other systems reviewed and are negative.     Allergies  Review of patient's allergies indicates no known allergies.  Home Medications   Prior to Admission medications   Medication Sig Start Date End Date Taking? Authorizing Provider  folic acid (FOLVITE) 1 MG tablet  Take 1 tablet (1 mg total) by mouth daily. For low folate 04/05/14  Yes Sanjuana Kava, NP  HYDROcodone-acetaminophen (NORCO/VICODIN) 5-325 MG per tablet Take 1 tablet by mouth every 6 (six) hours as needed for moderate pain or severe pain (pain).   Yes Historical Provider, MD  Multiple Vitamin (MULTIVITAMIN WITH MINERALS) TABS tablet Take 1 tablet by mouth daily. For low vitamin 04/05/14  Yes Sanjuana Kava, NP  OLANZapine zydis (ZYPREXA) 5 MG disintegrating tablet Take 1 tablet (5 mg total) by mouth at bedtime. For mood control 04/05/14  Yes Sanjuana Kava, NP  sertraline (ZOLOFT) 25 MG tablet Take 5 tablets (125 mg total) by mouth daily. For depression 04/05/14  Yes Sanjuana Kava, NP  temazepam (RESTORIL) 30 MG capsule Take 1 capsule (30 mg total) by mouth at bedtime. For sleep 04/05/14  Yes Sanjuana Kava, NP  benztropine (COGENTIN) 0.5 MG tablet Take 1 tablet (0.5 mg total) by mouth at bedtime. For prevention of drug induced tremors Patient not taking: Reported on 04/21/2014 04/05/14   Sanjuana Kava, NP   BP 128/81 mmHg  Pulse 88  Temp(Src) 98.9 F (37.2 C) (Oral)  Resp 18  SpO2 98% Physical Exam  Constitutional: He is oriented to person, place, and time. He appears well-developed and well-nourished. No distress.  HENT:  Head: Normocephalic and atraumatic.  Neck: Normal range of motion. Neck supple.  Cardiovascular: Normal rate, regular rhythm and normal  heart sounds.   No murmur heard. Pulmonary/Chest: Effort normal and breath sounds normal. No respiratory distress. He has no wheezes.  Abdominal: Soft. Bowel sounds are normal. He exhibits distension. There is tenderness.  There is some fullness and tenderness to the suprapubic region.  Musculoskeletal: Normal range of motion. He exhibits no edema.  Neurological: He is alert and oriented to person, place, and time.  Skin: Skin is warm and dry. He is not diaphoretic.  Nursing note and vitals reviewed.   ED Course  Procedures (including  critical care time) Labs Review Labs Reviewed  CBC WITH DIFFERENTIAL/PLATELET  BASIC METABOLIC PANEL  URINALYSIS, ROUTINE W REFLEX MICROSCOPIC    Imaging Review No results found.   EKG Interpretation None      MDM   Final diagnoses:  None    Patient brought for evaluation of urinary retention. He was here 2 days ago for similar complaints and required catheter drainage. Approximately 1500 mL were drained and the patient was discharged. He returns today with similar complaints. Foley cath was placed and 1400 mL of urine was obtained. His laboratory studies are otherwise unremarkable. The patient is somewhat erratic in his behavior, however denies any other complaints. He will be held up to a leg bag and discharged to home. I've spoken with the wife and son who are not present in the ER and updated them with the plan. He will follow-up with urology as an outpatient to discuss his urinary retention.    Geoffery Lyonsouglas Cobin Cadavid, MD 04/21/14 1600

## 2014-04-21 NOTE — ED Notes (Signed)
Molly Maduroobert EMT and Verlon AuLeslie NT attempt foley twice. Corliss Blackerobert EMT, Valma CavaLeslie NT, and Clydie BraunKaren NT attempt foley once. Esau Grewobert EMT, William NT, Evangeline GulaKaren RN, and Lequita HaltMorgan RN attempt foley once.  (TOTAL OF four attempts)   Pt combative with all attempts. Pt combative with any manipulation of penile region. Pt combative with cleaning area. Reassurance and reason for foley placement explained to pt by staff. With final attempt successful insertion but pt pulled foley out partially with 6 ml of balloon inflated. With discontinuing foley blood noted at tip of catheter. NO urine output. MD notified.

## 2014-04-21 NOTE — ED Notes (Signed)
Son will be here to pick up father/pt in 30 minutes.

## 2014-05-05 ENCOUNTER — Encounter (HOSPITAL_COMMUNITY): Payer: Self-pay | Admitting: Emergency Medicine

## 2014-05-05 ENCOUNTER — Emergency Department (HOSPITAL_COMMUNITY)
Admission: EM | Admit: 2014-05-05 | Discharge: 2014-05-06 | Disposition: A | Discharge status: Home / Self Care | Payer: Self-pay | Attending: Emergency Medicine | Admitting: Emergency Medicine

## 2014-05-05 DIAGNOSIS — Z8719 Personal history of other diseases of the digestive system: Secondary | ICD-10-CM | POA: Insufficient documentation

## 2014-05-05 DIAGNOSIS — Z8619 Personal history of other infectious and parasitic diseases: Secondary | ICD-10-CM | POA: Insufficient documentation

## 2014-05-05 DIAGNOSIS — G8929 Other chronic pain: Secondary | ICD-10-CM | POA: Insufficient documentation

## 2014-05-05 DIAGNOSIS — Z9889 Other specified postprocedural states: Secondary | ICD-10-CM | POA: Insufficient documentation

## 2014-05-05 DIAGNOSIS — Z8739 Personal history of other diseases of the musculoskeletal system and connective tissue: Secondary | ICD-10-CM | POA: Insufficient documentation

## 2014-05-05 DIAGNOSIS — L02415 Cutaneous abscess of right lower limb: Secondary | ICD-10-CM | POA: Insufficient documentation

## 2014-05-05 DIAGNOSIS — L02416 Cutaneous abscess of left lower limb: Secondary | ICD-10-CM | POA: Insufficient documentation

## 2014-05-05 DIAGNOSIS — Z8679 Personal history of other diseases of the circulatory system: Secondary | ICD-10-CM | POA: Insufficient documentation

## 2014-05-05 DIAGNOSIS — R339 Retention of urine, unspecified: Secondary | ICD-10-CM | POA: Insufficient documentation

## 2014-05-05 DIAGNOSIS — Z638 Other specified problems related to primary support group: Secondary | ICD-10-CM | POA: Insufficient documentation

## 2014-05-05 DIAGNOSIS — Z79899 Other long term (current) drug therapy: Secondary | ICD-10-CM | POA: Insufficient documentation

## 2014-05-05 LAB — CBC WITH DIFFERENTIAL/PLATELET
BASOS ABS: 0 10*3/uL (ref 0.0–0.1)
BASOS PCT: 0 % (ref 0–1)
Eosinophils Absolute: 0.1 10*3/uL (ref 0.0–0.7)
Eosinophils Relative: 1 % (ref 0–5)
HEMATOCRIT: 41.8 % (ref 39.0–52.0)
Hemoglobin: 14.5 g/dL (ref 13.0–17.0)
LYMPHS PCT: 16 % (ref 12–46)
Lymphs Abs: 2 10*3/uL (ref 0.7–4.0)
MCH: 29.8 pg (ref 26.0–34.0)
MCHC: 34.7 g/dL (ref 30.0–36.0)
MCV: 85.8 fL (ref 78.0–100.0)
MONOS PCT: 9 % (ref 3–12)
Monocytes Absolute: 1.1 10*3/uL — ABNORMAL HIGH (ref 0.1–1.0)
NEUTROS ABS: 9.4 10*3/uL — AB (ref 1.7–7.7)
Neutrophils Relative %: 74 % (ref 43–77)
PLATELETS: 233 10*3/uL (ref 150–400)
RBC: 4.87 MIL/uL (ref 4.22–5.81)
RDW: 13.7 % (ref 11.5–15.5)
WBC: 12.6 10*3/uL — AB (ref 4.0–10.5)

## 2014-05-05 LAB — BASIC METABOLIC PANEL
Anion gap: 12 (ref 5–15)
BUN: 11 mg/dL (ref 6–23)
CALCIUM: 9 mg/dL (ref 8.4–10.5)
CO2: 23 mmol/L (ref 19–32)
CREATININE: 0.9 mg/dL (ref 0.50–1.35)
Chloride: 104 mmol/L (ref 96–112)
GFR calc Af Amer: >90 mL/min (ref >90)
GFR calc non Af Amer: >90 mL/min (ref >90)
GLUCOSE: 147 mg/dL — AB (ref 70–99)
Potassium: 3.2 mmol/L — ABNORMAL LOW (ref 3.5–5.1)
Sodium: 139 mmol/L (ref 135–145)

## 2014-05-05 MED ORDER — OXYCODONE-ACETAMINOPHEN 5-325 MG PO TABS
2.0000 | ORAL_TABLET | Freq: Once | ORAL | Status: AC
  Administered 2014-05-05: 2 via ORAL
  Filled 2014-05-05: qty 2

## 2014-05-05 MED ORDER — DOXYCYCLINE HYCLATE 100 MG PO TABS
100.0000 mg | ORAL_TABLET | Freq: Once | ORAL | Status: AC
  Administered 2014-05-05: 100 mg via ORAL
  Filled 2014-05-05: qty 1

## 2014-05-05 NOTE — ED Provider Notes (Signed)
CSN: [id number]     Arrival date & time 05/05/14  2034 History   First MD Initiated Contact with Patient 05/05/14 2056     Chief Complaint  Patient presents with  . Insect Bite  . Abscess   Patient gave verbal permission to utilize photo for medical documentation only The image was not stored on any personal device    Patient is a 59 y.o. male presenting with abscess. The history is provided by the patient.  Abscess Location:  Leg Abscess quality: draining   Duration: "several days" Progression:  Worsening Chronicity:  New Relieved by:  Nothing Worsened by:  Nothing tried Associated symptoms: no fever, no headaches and no vomiting   patient presents with abscesses to lower extremities for past several days No fever/vomiting reported He has no other complaints except to remove is indwelling catheter. No trauma reported  Past Medical History  Diagnosis Date  . Complication of anesthesia     "my ears rang before I went out" (02/28/2012)  . C. difficile diarrhea ~ 2008; 02/28/2012  . Anginal pain ~ 2007  . GERD (gastroesophageal reflux disease)   . Chronic lower back pain   . Arthritis     "might be getting some" (02/28/2012)  . Stress at home     "from my daughter mostly" (02/28/2012)   Past Surgical History  Procedure Laterality Date  . Multiple tooth extractions  1990's    "3" (02/28/2012)  . Cardiac catheterization  ~ 2007   History reviewed. No pertinent family history. History  Substance Use Topics  . Smoking status: Never Smoker   . Smokeless tobacco: Never Used  . Alcohol Use: No     Comment: 02/28/2012 "5-6 beers on the weekends"    Review of Systems  Constitutional: Negative for fever.  Cardiovascular: Negative for chest pain.  Gastrointestinal: Negative for vomiting and abdominal pain.  Skin: Positive for wound.  Neurological: Negative for headaches.  All other systems reviewed and are negative.     Allergies  Review of patient's allergies  indicates no known allergies.  Home Medications   Prior to Admission medications   Medication Sig Start Date End Date Taking? Authorizing Provider  benztropine (COGENTIN) 0.5 MG tablet Take 1 tablet (0.5 mg total) by mouth at bedtime. For prevention of drug induced tremors Patient not taking: Reported on 04/21/2014 04/05/14   Sanjuana KavaAgnes I Nwoko, NP  folic acid (FOLVITE) 1 MG tablet Take 1 tablet (1 mg total) by mouth daily. For low folate 04/05/14   Sanjuana KavaAgnes I Nwoko, NP  HYDROcodone-acetaminophen (NORCO/VICODIN) 5-325 MG per tablet Take 1 tablet by mouth every 6 (six) hours as needed for moderate pain or severe pain (pain).    Historical Provider, MD  Multiple Vitamin (MULTIVITAMIN WITH MINERALS) TABS tablet Take 1 tablet by mouth daily. For low vitamin 04/05/14   Sanjuana KavaAgnes I Nwoko, NP  OLANZapine zydis (ZYPREXA) 5 MG disintegrating tablet Take 1 tablet (5 mg total) by mouth at bedtime. For mood control 04/05/14   Sanjuana KavaAgnes I Nwoko, NP  sertraline (ZOLOFT) 25 MG tablet Take 5 tablets (125 mg total) by mouth daily. For depression 04/05/14   Sanjuana KavaAgnes I Nwoko, NP  temazepam (RESTORIL) 30 MG capsule Take 1 capsule (30 mg total) by mouth at bedtime. For sleep 04/05/14   Sanjuana KavaAgnes I Nwoko, NP   BP 119/74 mmHg  Pulse 86  Temp(Src) 99.1 F (37.3 C) (Oral)  Resp 18  SpO2 93% Physical Exam CONSTITUTIONAL: Well developed/well nourished HEAD: Normocephalic/atraumatic EYES: EOMI ENMT:  Mucous membranes moist NECK: supple no meningeal signs SPINE/BACK:entire spine nontender CV: S1/S2 noted, no murmurs/rubs/gallops noted LUNGS: Lungs are clear to auscultation bilaterally, no apparent distress ABDOMEN: soft, nontender, no rebound or guarding, bowel sounds noted throughout abdomen NEURO: Pt is awake/alert/appropriate, moves all extremitiesx4.  No facial droop.   GU - indwelling catheter noted on arrival EXTREMITIES: pulses normal/equal, full ROM SKIN: warm, color normal. No crepitus to lower extremties.  Mild tenderness to lesions  noted. PSYCH: no abnormalities of mood noted, alert and oriented to situation    ED Course  Procedures  11:09 PM Pt with small abscesses to legs.  No crepitus or signs of deep space infection.  There is no streaking noted.  I feel this can be treated with wound care at home with doxycycline Pt/family would like catheter removed as he has not seen urology and may not be able to see them due to cost.  He was supposed to see them on 2/18.  I have asked tech to remove catheter Family denies any other acute issue - son reports pt is otherwise at baseline  12:44 AM Pt taking PO and will attempt void urine Family has left ER but left this number: [phone] [facility] Once he is able to void he can be discharged.  If he is unable to void he will need catheter placed Otherwise he is stable for d/c home.   Labs Review Labs Reviewed  BASIC METABOLIC PANEL - Abnormal; Notable for the following:    Potassium 3.2 (*)    Glucose, Bld 147 (*)    All other components within normal limits  CBC WITH DIFFERENTIAL/PLATELET - Abnormal; Notable for the following:    WBC 12.6 (*)    Neutro Abs 9.4 (*)    Monocytes Absolute 1.1 (*)    All other components within normal limits    Medications  oxyCODONE-acetaminophen (PERCOCET/ROXICET) 5-325 MG per tablet 2 tablet (2 tablets Oral Given 05/05/14 2110)  doxycycline (VIBRA-TABS) tablet 100 mg (100 mg Oral Given 05/05/14 2152)     MDM   Final diagnoses:  Multiple abscesses of both legs  Urinary retention    Nursing notes including past medical history and social history reviewed and considered in documentation Labs/vital reviewed myself and considered during evaluation Previous records reviewed and considered     Joya Gaskins, MD 05/06/14 [phone]

## 2014-05-05 NOTE — ED Notes (Addendum)
Pt presents to ED with multiple abscesses in bilateral legs. 4 at right leg and 1 at left leg. Pt states abscess started at bumps then swelled with yellow pus. Pt denies chills or fever. Pt thinks it might be spider bites. Pt has foley catheter with leg bag, requesting removal, states unable to see urologist, could not afford.

## 2014-05-06 MED ORDER — DOXYCYCLINE HYCLATE 100 MG PO CAPS: 100.0000 mg | ORAL_CAPSULE | Freq: Two times a day (BID) | ORAL | Status: DC

## 2014-05-06 NOTE — ED Notes (Signed)
Pt a/o x 4 on d/c in wheelchair, driven home by son.

## 2014-05-06 NOTE — Discharge Instructions (Signed)

## 2014-05-06 NOTE — ED Notes (Signed)
Made MD aware that pt was refusing catheter at this time, MD told nurse to sign pt out AMA.  Pt made aware of benefits of a catheter and cons of not having a catheter at this time.

## 2014-05-06 NOTE — ED Notes (Signed)
Notified DR that bladder scan showed [id number], dr. Truitt MerleNotified nurse that foley is to be put in at 0215 if pt has not voided.

## 2014-05-30 ENCOUNTER — Telehealth: Payer: Self-pay | Admitting: *Deleted

## 2014-05-30 NOTE — Telephone Encounter (Signed)
Patient cancel new patient appointment due to financial reasons Dr. Worthy FlankPatersons office notified [facility] Selena BattenKim)

## 2014-06-09 ENCOUNTER — Ambulatory Visit: Payer: Self-pay | Admitting: Neurology

## 2014-08-13 ENCOUNTER — Encounter (HOSPITAL_COMMUNITY): Payer: Self-pay | Admitting: Emergency Medicine

## 2014-08-13 DIAGNOSIS — N179 Acute kidney failure, unspecified: Secondary | ICD-10-CM | POA: Diagnosis present

## 2014-08-13 DIAGNOSIS — N400 Enlarged prostate without lower urinary tract symptoms: Secondary | ICD-10-CM | POA: Diagnosis present

## 2014-08-13 DIAGNOSIS — L03115 Cellulitis of right lower limb: Principal | ICD-10-CM | POA: Diagnosis present

## 2014-08-13 DIAGNOSIS — F329 Major depressive disorder, single episode, unspecified: Secondary | ICD-10-CM | POA: Diagnosis present

## 2014-08-13 DIAGNOSIS — Z79899 Other long term (current) drug therapy: Secondary | ICD-10-CM

## 2014-08-13 DIAGNOSIS — K219 Gastro-esophageal reflux disease without esophagitis: Secondary | ICD-10-CM | POA: Diagnosis present

## 2014-08-13 DIAGNOSIS — W57XXXA Bitten or stung by nonvenomous insect and other nonvenomous arthropods, initial encounter: Secondary | ICD-10-CM | POA: Diagnosis present

## 2014-08-13 DIAGNOSIS — M545 Low back pain: Secondary | ICD-10-CM | POA: Diagnosis present

## 2014-08-13 DIAGNOSIS — G8929 Other chronic pain: Secondary | ICD-10-CM | POA: Diagnosis present

## 2014-08-13 NOTE — ED Notes (Signed)
Pt. reports abscess/? insect bite at right inner lower leg onset yesterday with swelling and drainage , pt. also reported  Tick bite at back today .

## 2014-08-14 ENCOUNTER — Encounter (HOSPITAL_COMMUNITY): Payer: Self-pay | Admitting: Internal Medicine

## 2014-08-14 ENCOUNTER — Inpatient Hospital Stay (HOSPITAL_COMMUNITY)
Admission: EM | Admit: 2014-08-14 | Discharge: 2014-08-17 | DRG: 603 | Disposition: A | Discharge status: Home / Self Care | Payer: Medicaid Other | Attending: Internal Medicine | Admitting: Internal Medicine

## 2014-08-14 DIAGNOSIS — L03115 Cellulitis of right lower limb: Secondary | ICD-10-CM | POA: Diagnosis not present

## 2014-08-14 DIAGNOSIS — N400 Enlarged prostate without lower urinary tract symptoms: Secondary | ICD-10-CM | POA: Diagnosis present

## 2014-08-14 DIAGNOSIS — F329 Major depressive disorder, single episode, unspecified: Secondary | ICD-10-CM | POA: Diagnosis present

## 2014-08-14 DIAGNOSIS — N179 Acute kidney failure, unspecified: Secondary | ICD-10-CM | POA: Diagnosis present

## 2014-08-14 DIAGNOSIS — R52 Pain, unspecified: Secondary | ICD-10-CM

## 2014-08-14 DIAGNOSIS — L0291 Cutaneous abscess, unspecified: Secondary | ICD-10-CM | POA: Insufficient documentation

## 2014-08-14 DIAGNOSIS — Z79899 Other long term (current) drug therapy: Secondary | ICD-10-CM | POA: Diagnosis not present

## 2014-08-14 DIAGNOSIS — W57XXXA Bitten or stung by nonvenomous insect and other nonvenomous arthropods, initial encounter: Secondary | ICD-10-CM | POA: Diagnosis present

## 2014-08-14 DIAGNOSIS — G8929 Other chronic pain: Secondary | ICD-10-CM | POA: Diagnosis present

## 2014-08-14 DIAGNOSIS — D72829 Elevated white blood cell count, unspecified: Secondary | ICD-10-CM | POA: Diagnosis not present

## 2014-08-14 DIAGNOSIS — M545 Low back pain, unspecified: Secondary | ICD-10-CM | POA: Diagnosis present

## 2014-08-14 DIAGNOSIS — K219 Gastro-esophageal reflux disease without esophagitis: Secondary | ICD-10-CM | POA: Diagnosis present

## 2014-08-14 DIAGNOSIS — L0390 Cellulitis, unspecified: Secondary | ICD-10-CM

## 2014-08-14 LAB — CBC WITH DIFFERENTIAL/PLATELET
BASOS ABS: 0 10*3/uL (ref 0.0–0.1)
BASOS PCT: 0 % (ref 0–1)
BASOS PCT: 0 % (ref 0–1)
Basophils Absolute: 0 10*3/uL (ref 0.0–0.1)
EOS PCT: 1 % (ref 0–5)
Eosinophils Absolute: 0.1 10*3/uL (ref 0.0–0.7)
Eosinophils Absolute: 0.1 10*3/uL (ref 0.0–0.7)
Eosinophils Relative: 1 % (ref 0–5)
HCT: 35.4 % — ABNORMAL LOW (ref 39.0–52.0)
HCT: 39.9 % (ref 39.0–52.0)
HEMOGLOBIN: 13.4 g/dL (ref 13.0–17.0)
Hemoglobin: 11.9 g/dL — ABNORMAL LOW (ref 13.0–17.0)
LYMPHS PCT: 17 % (ref 12–46)
Lymphocytes Relative: 18 % (ref 12–46)
Lymphs Abs: 2.2 10*3/uL (ref 0.7–4.0)
Lymphs Abs: 2.4 10*3/uL (ref 0.7–4.0)
MCH: 29.5 pg (ref 26.0–34.0)
MCH: 29.8 pg (ref 26.0–34.0)
MCHC: 33.6 g/dL (ref 30.0–36.0)
MCHC: 33.6 g/dL (ref 30.0–36.0)
MCV: 87.8 fL (ref 78.0–100.0)
MCV: 88.9 fL (ref 78.0–100.0)
MONOS PCT: 7 % (ref 3–12)
Monocytes Absolute: 1.1 10*3/uL — ABNORMAL HIGH (ref 0.1–1.0)
Monocytes Absolute: 1.1 10*3/uL — ABNORMAL HIGH (ref 0.1–1.0)
Monocytes Relative: 9 % (ref 3–12)
NEUTROS PCT: 72 % (ref 43–77)
Neutro Abs: 10.6 10*3/uL — ABNORMAL HIGH (ref 1.7–7.7)
Neutro Abs: 8.9 10*3/uL — ABNORMAL HIGH (ref 1.7–7.7)
Neutrophils Relative %: 75 % (ref 43–77)
Platelets: 142 10*3/uL — ABNORMAL LOW (ref 150–400)
Platelets: 179 10*3/uL (ref 150–400)
RBC: 4.03 MIL/uL — ABNORMAL LOW (ref 4.22–5.81)
RBC: 4.49 MIL/uL (ref 4.22–5.81)
RDW: 15.1 % (ref 11.5–15.5)
RDW: 15.1 % (ref 11.5–15.5)
WBC: 12.3 10*3/uL — AB (ref 4.0–10.5)
WBC: 14.1 10*3/uL — ABNORMAL HIGH (ref 4.0–10.5)

## 2014-08-14 LAB — COMPREHENSIVE METABOLIC PANEL
ALBUMIN: 3.7 g/dL (ref 3.5–5.0)
ALK PHOS: 54 U/L (ref 38–126)
ALT: 13 U/L — AB (ref 17–63)
ALT: 16 U/L — ABNORMAL LOW (ref 17–63)
ANION GAP: 11 (ref 5–15)
AST: 13 U/L — AB (ref 15–41)
AST: 17 U/L (ref 15–41)
Albumin: 2.9 g/dL — ABNORMAL LOW (ref 3.5–5.0)
Alkaline Phosphatase: 70 U/L (ref 38–126)
Anion gap: 5 (ref 5–15)
BUN: 11 mg/dL (ref 6–20)
BUN: 16 mg/dL (ref 6–20)
CALCIUM: 8.1 mg/dL — AB (ref 8.9–10.3)
CHLORIDE: 103 mmol/L (ref 101–111)
CHLORIDE: 106 mmol/L (ref 101–111)
CO2: 24 mmol/L (ref 22–32)
CO2: 26 mmol/L (ref 22–32)
CREATININE: 1.01 mg/dL (ref 0.61–1.24)
Calcium: 8.9 mg/dL (ref 8.9–10.3)
Creatinine, Ser: 1.43 mg/dL — ABNORMAL HIGH (ref 0.61–1.24)
GFR calc Af Amer: >60 mL/min (ref >60)
GFR calc Af Amer: >60 mL/min (ref >60)
GFR calc non Af Amer: 53 mL/min — ABNORMAL LOW (ref >60)
GFR calc non Af Amer: >60 mL/min (ref >60)
GLUCOSE: 120 mg/dL — AB (ref 65–99)
Glucose, Bld: 102 mg/dL — ABNORMAL HIGH (ref 65–99)
POTASSIUM: 3.4 mmol/L — AB (ref 3.5–5.1)
Potassium: 3.6 mmol/L (ref 3.5–5.1)
SODIUM: 138 mmol/L (ref 135–145)
Sodium: 137 mmol/L (ref 135–145)
Total Bilirubin: 0.7 mg/dL (ref 0.3–1.2)
Total Bilirubin: 0.8 mg/dL (ref 0.3–1.2)
Total Protein: 5.7 g/dL — ABNORMAL LOW (ref 6.5–8.1)
Total Protein: 6.7 g/dL (ref 6.5–8.1)

## 2014-08-14 LAB — I-STAT CG4 LACTIC ACID, ED: LACTIC ACID, VENOUS: 0.98 mmol/L (ref 0.5–2.0)

## 2014-08-14 LAB — LACTIC ACID, PLASMA: Lactic Acid, Venous: 1.4 mmol/L (ref 0.5–2.0)

## 2014-08-14 LAB — PROCALCITONIN: Procalcitonin: <0.1 ng/mL

## 2014-08-14 MED ORDER — ADULT MULTIVITAMIN W/MINERALS CH
1.0000 | ORAL_TABLET | Freq: Every day | ORAL | Status: DC
  Administered 2014-08-14 – 2014-08-17 (×4): 1 via ORAL
  Filled 2014-08-14 (×4): qty 1

## 2014-08-14 MED ORDER — SODIUM CHLORIDE 0.9 % IV SOLN
INTRAVENOUS | Status: DC
  Administered 2014-08-14: 05:00:00 via INTRAVENOUS

## 2014-08-14 MED ORDER — HYDROCODONE-ACETAMINOPHEN 5-325 MG PO TABS
1.0000 | ORAL_TABLET | ORAL | Status: DC | PRN
  Administered 2014-08-14: 1 via ORAL
  Administered 2014-08-14 – 2014-08-16 (×5): 2 via ORAL
  Administered 2014-08-16: 1 via ORAL
  Administered 2014-08-16 (×3): 2 via ORAL
  Administered 2014-08-17 (×2): 1 via ORAL
  Administered 2014-08-17: 2 via ORAL
  Filled 2014-08-14 (×4): qty 2
  Filled 2014-08-14: qty 1
  Filled 2014-08-14 (×4): qty 2
  Filled 2014-08-14: qty 1
  Filled 2014-08-14 (×2): qty 2
  Filled 2014-08-14: qty 1

## 2014-08-14 MED ORDER — VANCOMYCIN HCL 10 G IV SOLR
1500.0000 mg | Freq: Once | INTRAVENOUS | Status: AC
  Administered 2014-08-14: 1500 mg via INTRAVENOUS
  Filled 2014-08-14: qty 1500

## 2014-08-14 MED ORDER — VITAMIN B-12 1000 MCG PO TABS
1000.0000 ug | ORAL_TABLET | Freq: Every day | ORAL | Status: DC
  Administered 2014-08-14 – 2014-08-17 (×4): 1000 ug via ORAL
  Filled 2014-08-14 (×4): qty 1

## 2014-08-14 MED ORDER — ACETAMINOPHEN 325 MG PO TABS
650.0000 mg | ORAL_TABLET | Freq: Four times a day (QID) | ORAL | Status: DC | PRN
  Administered 2014-08-17: 650 mg via ORAL
  Filled 2014-08-14: qty 2

## 2014-08-14 MED ORDER — DOXYCYCLINE HYCLATE 100 MG IV SOLR
100.0000 mg | Freq: Two times a day (BID) | INTRAVENOUS | Status: DC
  Administered 2014-08-14 – 2014-08-16 (×5): 100 mg via INTRAVENOUS
  Filled 2014-08-14 (×6): qty 100

## 2014-08-14 MED ORDER — VANCOMYCIN HCL IN DEXTROSE 1-5 GM/200ML-% IV SOLN
1000.0000 mg | Freq: Two times a day (BID) | INTRAVENOUS | Status: DC
  Administered 2014-08-14 – 2014-08-17 (×6): 1000 mg via INTRAVENOUS
  Filled 2014-08-14 (×7): qty 200

## 2014-08-14 MED ORDER — ACETAMINOPHEN 500 MG PO TABS
1000.0000 mg | ORAL_TABLET | Freq: Once | ORAL | Status: AC
  Administered 2014-08-14: 1000 mg via ORAL
  Filled 2014-08-14: qty 2

## 2014-08-14 MED ORDER — POTASSIUM CHLORIDE CRYS ER 20 MEQ PO TBCR
40.0000 meq | EXTENDED_RELEASE_TABLET | Freq: Once | ORAL | Status: AC
  Administered 2014-08-14: 40 meq via ORAL
  Filled 2014-08-14: qty 2

## 2014-08-14 MED ORDER — SODIUM CHLORIDE 0.9 % IV BOLUS (SEPSIS)
2000.0000 mL | Freq: Once | INTRAVENOUS | Status: AC
  Administered 2014-08-14: 2000 mL via INTRAVENOUS

## 2014-08-14 MED ORDER — ENOXAPARIN SODIUM 40 MG/0.4ML ~~LOC~~ SOLN
40.0000 mg | SUBCUTANEOUS | Status: DC
  Administered 2014-08-14 – 2014-08-16 (×3): 40 mg via SUBCUTANEOUS
  Filled 2014-08-14 (×4): qty 0.4

## 2014-08-14 MED ORDER — ONDANSETRON HCL 4 MG/2ML IJ SOLN: 4.0000 mg | Freq: Four times a day (QID) | INTRAMUSCULAR | Status: DC | PRN

## 2014-08-14 MED ORDER — TETANUS-DIPHTH-ACELL PERTUSSIS 5-2.5-18.5 LF-MCG/0.5 IM SUSP
0.5000 mL | Freq: Once | INTRAMUSCULAR | Status: AC
  Administered 2014-08-14: 0.5 mL via INTRAMUSCULAR
  Filled 2014-08-14: qty 0.5

## 2014-08-14 MED ORDER — ONDANSETRON HCL 4 MG PO TABS
4.0000 mg | ORAL_TABLET | Freq: Four times a day (QID) | ORAL | Status: DC | PRN
  Administered 2014-08-14 – 2014-08-17 (×2): 4 mg via ORAL
  Filled 2014-08-14 (×2): qty 1

## 2014-08-14 MED ORDER — ACETAMINOPHEN 650 MG RE SUPP: 650.0000 mg | Freq: Four times a day (QID) | RECTAL | Status: DC | PRN

## 2014-08-14 MED ORDER — HYDROCODONE-ACETAMINOPHEN 5-325 MG PO TABS
1.0000 | ORAL_TABLET | Freq: Four times a day (QID) | ORAL | Status: DC | PRN
  Administered 2014-08-14: 1 via ORAL
  Filled 2014-08-14 (×2): qty 1

## 2014-08-14 MED ORDER — TAMSULOSIN HCL 0.4 MG PO CAPS
0.4000 mg | ORAL_CAPSULE | Freq: Every day | ORAL | Status: DC
  Administered 2014-08-14 – 2014-08-17 (×4): 0.4 mg via ORAL
  Filled 2014-08-14 (×4): qty 1

## 2014-08-14 MED ORDER — SERTRALINE HCL 25 MG PO TABS
125.0000 mg | ORAL_TABLET | Freq: Every day | ORAL | Status: DC
  Administered 2014-08-14 – 2014-08-17 (×4): 125 mg via ORAL
  Filled 2014-08-14 (×4): qty 1

## 2014-08-14 MED ORDER — TEMAZEPAM 15 MG PO CAPS
15.0000 mg | ORAL_CAPSULE | Freq: Every evening | ORAL | Status: DC | PRN
  Administered 2014-08-14 – 2014-08-16 (×3): 15 mg via ORAL
  Filled 2014-08-14 (×3): qty 1

## 2014-08-14 NOTE — ED Notes (Signed)
Pt c/o swelling to R lower leg x 2 days with chills and NV. Reports trying on old clothes yesterday, then noticed swelling to leg today. Pt had previously circled area of redness, redness has extended boundaries. Skin marked to track area. Small, brown circular wound noted to leg with redness. Swelling also present into ankle. Pain worsens when bearing weight. Pt also reports removing a tick from L middle back yesterday.

## 2014-08-14 NOTE — Hospital Discharge Follow-Up (Signed)
Transitional Care Clinic Care Coordination Note:  Admit date:  08/14/2014  Discharge date: TBD ? 08/15/2014 Discharge Disposition: HOME Patient contact:  [phone] Emergency contact(s):  Quentin Mulling ( wife) [phone]  This Case Manager reviewed patient's EMR and determined patient would benefit from post-discharge medical management and chronic care management services through the [facility]. Patient has a history of cellulitis, chronic low back pain and major depressive disorder. This Case Manager met with patient to discuss the services and medical management that can be provided at the [facility]. Patient verbalized understanding and [facility]   Patient scheduled for Transitional Care appointment on 08/22/2014 at 10:30am w/ Dr Jarold Song.  Clinic information and appointment time provided to patient. Appointment information also placed on AVS.  Assessment:       Home Environment:  Patient stated that he lives in a small private home w/ his wife and his  57yo daughter and his 57yo granddaughter.  His wife is disabled and is on O2 at night. He reported that he does not have any stairs in the home. In addition, he noted that the house is in need of a new roof and there is currently a tarp on the roof.        Support System:  His wife, Quentin Mulling.       Level of functioning: indpendant prior to admission. 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 He noted that his work has been limited due to his medical problems.        Home DME: none       Home care services: No services in place at this time.        Transportation:  Patient stated that he drives and can drive himself to his medical appointments.         Food/Nutrition: He stated that he receives $126/month in food stamps. He reported that he does the cooking and his daughter can help if needed.  He said that he has been doing  the grocery shopping w/ the assistance of his daughter or her friends.  He said that his wife is hesitant to leave the house.         Medications: He stated that he is not always able to afford his meds and he has his prescriptions filled at different pharmacies. THe patient reported that he obtains the prescription for zoloft from [facility]. Discussed pharmacy resources available at [facility].        Barriers: Medicaid is pending. DSS caseworker: Luther Bradley; but he did not have her contact number.         PCP : Leanna Battles.  The patient said that he has accumulated a bill with Dr Philip Aspen.   Patient Education: Patient was educated about the services provided by the [facility], including pharmacy resources, social service support and financial counseling for the orange card.             Arranged services:        Services communicated to Tomi Bamberger, RN, Case Manager.

## 2014-08-14 NOTE — Progress Notes (Signed)
Pt arrived to floor in room 5W31, transferred from ED. Vitals stable. Alert and oriented, no distress. Complains of pain 8/10 on right lower leg. Redness and swelling noted to right lower leg, no drainage. Oriented to room and equipment. Call bell within reach.

## 2014-08-14 NOTE — H&P (Signed)
[facility] History and Physical  Walter SonsDavid B Marschner ZOX:[id number] DOB: 03/06/1956 DOA: 08/14/2014  Referring physician: Dr. Mora Bellmanni. PCP: Garlan FillersPATERSON,DANIEL G, MD  Specialists: None.  Chief Complaint: Right lower extremity swelling.  HPI: Walter SonsDavid B Holder is a 59 y.o. male with history of chronic low back pain and depression presents to the ER because of worsening pain in the right lower extremity with swelling. Patient states he may have been bitten by an insect but not sure but started developing swelling and redness with fever and chills in the right lower extremity. Since the swelling progressed patient came to the ER. In the ER patient was found to be tachycardic and mildly febrile. In addition patient states his son take the tick from his back 3 days ago. In the ER patient on exam has a right lower extremity erythema and swelling but on sonogram exam there was no definite abscess. Given the patient's elevated temperature at this time patient has been admitted for IV antibodies.   Review of Systems: As presented in the history of presenting illness, rest negative.  Past Medical History  Diagnosis Date  . Complication of anesthesia     "my ears rang before I went out" (02/28/2012)  . C. difficile diarrhea ~ 2008; 02/28/2012  . Anginal pain ~ 2007  . GERD (gastroesophageal reflux disease)   . Chronic lower back pain   . Arthritis     "might be getting some" (02/28/2012)  . Stress at home     "from my daughter mostly" (02/28/2012)   Past Surgical History  Procedure Laterality Date  . Multiple tooth extractions  1990's    "3" (02/28/2012)  . Cardiac catheterization  ~ 2007   Social History:  reports that he has never smoked. He has never used smokeless tobacco. He reports that he does not drink alcohol or use illicit drugs. Where does patient live home. Can patient participate in ADLs? Yes.  No Known Allergies  Family History:  Family History  Problem Relation Age of Onset  .  Diabetes Mellitus II Neg Hx       Prior to Admission medications   Medication Sig Start Date End Date Taking? Authorizing Provider  HYDROcodone-acetaminophen (NORCO/VICODIN) 5-325 MG per tablet Take 1 tablet by mouth every 6 (six) hours as needed for moderate pain or severe pain (pain).   Yes Historical Provider, MD  Multiple Vitamin (MULTIVITAMIN WITH MINERALS) TABS tablet Take 1 tablet by mouth daily. For low vitamin 04/05/14  Yes Sanjuana KavaAgnes I Nwoko, NP  sertraline (ZOLOFT) 25 MG tablet Take 5 tablets (125 mg total) by mouth daily. For depression 04/05/14  Yes Sanjuana KavaAgnes I Nwoko, NP  tamsulosin (FLOMAX) 0.4 MG CAPS capsule Take 0.4 mg by mouth daily.   Yes Historical Provider, MD  vitamin B-12 (CYANOCOBALAMIN) 1000 MCG tablet Take 1,000 mcg by mouth daily.   Yes Historical Provider, MD  OLANZapine zydis (ZYPREXA) 5 MG disintegrating tablet Take 1 tablet (5 mg total) by mouth at bedtime. For mood control Patient not taking: Reported on 08/14/2014 04/05/14   Sanjuana KavaAgnes I Nwoko, NP  temazepam (RESTORIL) 30 MG capsule Take 1 capsule (30 mg total) by mouth at bedtime. For sleep Patient not taking: Reported on 08/14/2014 04/05/14   Sanjuana KavaAgnes I Nwoko, NP    Physical Exam: Filed Vitals:   08/13/14 2335 08/14/14 0143 08/14/14 0230  BP: 148/88 113/92   Pulse: 120 111   Temp: 99.4 F (37.4 C)  100.7 F (38.2 C)  TempSrc: Oral  Oral  Resp:  20 18   SpO2: 97% 100%      General:  Well-developed and nourished.  Eyes: Anicteric no pallor.  ENT: No discharge from the ears eyes nose and mouth.  Neck: No mass felt.  Cardiovascular: S1-S2 heard.  Respiratory: No rhonchi or crepitations.  Abdomen: Soft nontender bowel sounds present.  Skin: Right lower extremity has erythema extending from the foot up to the mid calf on the medial aspect with mild indurated area in the middle of the lesion.  Musculoskeletal: See skin section.  Psychiatric: Appears normal at this time. Denies any suicidal thoughts.  Neurologic:  Alert awake oriented to time place and person. Moves all extremities.  Labs on Admission:  Basic Metabolic Panel:  Recent Labs Lab 08/13/14 2342  NA 138  K 3.6  CL 103  CO2 24  GLUCOSE 102*  BUN 16  CREATININE 1.43*  CALCIUM 8.9   Liver Function Tests:  Recent Labs Lab 08/13/14 2342  AST 17  ALT 16*  ALKPHOS 70  BILITOT 0.8  PROT 6.7  ALBUMIN 3.7   No results for input(s): LIPASE, AMYLASE in the last 168 hours. No results for input(s): AMMONIA in the last 168 hours. CBC:  Recent Labs Lab 08/13/14 2342  WBC 14.1*  NEUTROABS 10.6*  HGB 13.4  HCT 39.9  MCV 88.9  PLT 179   Cardiac Enzymes: No results for input(s): CKTOTAL, CKMB, CKMBINDEX, TROPONINI in the last 168 hours.  BNP (last 3 results) No results for input(s): BNP in the last 8760 hours.  ProBNP (last 3 results) No results for input(s): PROBNP in the last 8760 hours.  CBG: No results for input(s): GLUCAP in the last 168 hours.  Radiological Exams on Admission: No results found.   Assessment/Plan Principal Problem:   Cellulitis of right lower extremity Active Problems:   Low back pain   1. Developing sepsis from right lower extremity cellulitis - patient sonogram does not show any definite evidence for abscess. At this time I have placed patient on vancomycin. Follow blood cultures. Continue with hydration. 2. Acute renal failure - continue with hydration most likely prerenal. Patient also takes BPH which patient has not been taking for last couple of days. This will be received. There is no evidence of any urinary obstruction at this time. Check a UA. 3. Tick bite - patient has been placed on doxycycline. Check RMSF and Lyme titers. 4. History of depression - patient states he was not taking his medication for last few days. Patient used to be on Zoloft which has been restarted. Denies any suicidal ideation. 5. Chronic low back pain - continue home medications.   DVT Prophylaxis Lovenox.   Code Status: Full code.  Family Communication: Discussed with patient.  Disposition Plan: Admit to inpatient. Likely stay 2 days.    KAKRAKANDY,ARSHAD N. [facility] Pager [phone].  If 7PM-7AM, please contact night-coverage [email or web address] Password [email or web address] 08/14/2014, 4:15 AM

## 2014-08-14 NOTE — Progress Notes (Signed)
Called to receive report from ED nurse, [place]. RN to recheck BP prior to transfer pt to 5W.

## 2014-08-14 NOTE — Progress Notes (Signed)
ANTIBIOTIC CONSULT NOTE - INITIAL  Pharmacy Consult for vancomycin Indication: cellulitis  No Known Allergies  Patient Measurements: Height: 5\' 9"  (175.3 cm) Weight: 188 lb 15 oz (85.7 kg) IBW/kg (Calculated) : 70.7  Vital Signs: Temp: 98 F (36.7 C) (05/26 0431) Temp Source: Oral (05/26 0431) BP: 122/69 mmHg (05/26 0431) Pulse Rate: 95 (05/26 0431)  Labs:  Recent Labs  08/13/14 2342  WBC 14.1*  HGB 13.4  PLT 179  CREATININE 1.43*   Estimated Creatinine Clearance: 61.8 mL/min (by C-G formula based on Cr of 1.43).  Medical History: Past Medical History  Diagnosis Date  . Complication of anesthesia     "my ears rang before I went out" (02/28/2012)  . C. difficile diarrhea ~ 2008; 02/28/2012  . Anginal pain ~ 2007  . GERD (gastroesophageal reflux disease)   . Chronic lower back pain   . Arthritis     "might be getting some" (02/28/2012)  . Stress at home     "from my daughter mostly" (02/28/2012)    Medications:  Prescriptions prior to admission  Medication Sig Dispense Refill Last Dose  . HYDROcodone-acetaminophen (NORCO/VICODIN) 5-325 MG per tablet Take 1 tablet by mouth every 6 (six) hours as needed for moderate pain or severe pain (pain).   Past Week at Unknown time  . Multiple Vitamin (MULTIVITAMIN WITH MINERALS) TABS tablet Take 1 tablet by mouth daily. For low vitamin 30 tablet 0 08/13/2014 at Unknown time  . sertraline (ZOLOFT) 25 MG tablet Take 5 tablets (125 mg total) by mouth daily. For depression 150 tablet 0 Past Month at Unknown time  . tamsulosin (FLOMAX) 0.4 MG CAPS capsule Take 0.4 mg by mouth daily.   Past Month at Unknown time  . vitamin B-12 (CYANOCOBALAMIN) 1000 MCG tablet Take 1,000 mcg by mouth daily.   08/13/2014 at Unknown time  . OLANZapine zydis (ZYPREXA) 5 MG disintegrating tablet Take 1 tablet (5 mg total) by mouth at bedtime. For mood control (Patient not taking: Reported on 08/14/2014) 30 tablet 0 Past Week at Unknown time  . temazepam  (RESTORIL) 30 MG capsule Take 1 capsule (30 mg total) by mouth at bedtime. For sleep (Patient not taking: Reported on 08/14/2014) 30 capsule 0 04/20/2014 at Unknown time   Scheduled:  . doxycycline (VIBRAMYCIN) IV  100 mg Intravenous Q12H  . enoxaparin (LOVENOX) injection  40 mg Subcutaneous Q24H  . multivitamin with minerals  1 tablet Oral Daily  . sertraline  125 mg Oral Daily  . tamsulosin  0.4 mg Oral Daily  . Tdap  0.5 mL Intramuscular Once  . vitamin B-12  1,000 mcg Oral Daily   Infusions:  . sodium chloride      Assessment: 59yo male c/o insect bite at right inner lower leg that has worsened w/ swelling and drainage and associated chills and N/V, also reports tick bite to back today, noted in ED to be tachycardic and febrile, to begin IV ABX for cellulitis.  Goal of Therapy:  Vancomycin trough level 10-15 mcg/ml  Plan:  Rec'd vanc 1500mg  IV in ED; will continue with vancomycin 1000mg  IV Q12H and monitor CBC, Cx, levels prn.  Vernard GamblesVeronda Clemmie Marxen, PharmD, BCPS  08/14/2014,5:04 AM

## 2014-08-14 NOTE — Progress Notes (Signed)
Nutrition Brief Note  Patient identified on the Malnutrition Screening Tool (MST) Report  Wt Readings from Last 15 Encounters:  08/14/14 188 lb 15 oz (85.7 kg)  04/19/14 189 lb (85.73 kg)  03/29/14 189 lb (85.73 kg)  02/08/14 200 lb (90.719 kg)  02/28/12 225 lb (102.059 kg)   Walter Holder is a 59 y.o. male with history of chronic low back pain and depression presents to the ER because of worsening pain in the right lower extremity with swelling. Patient states he may have been bitten by an insect but not sure but started developing swelling and redness with fever and chills in the right lower extremity. Since the swelling progressed patient came to the ER. In the ER patient was found to be tachycardic and mildly febrile. In addition patient states his son take the tick from his back 3 days ago. In the ER patient on exam has a right lower extremity erythema and swelling but on sonogram exam there was no definite abscess. Given the patient's elevated temperature at this time patient has been admitted for IV antibodies.   Pt asleep at time of visit x 2. No signs of fat or muscle depletion noted on arms, hands, clavicle, temple, or orbital region. Noted that pt has maintained his weight since 03/2014, [facility] He is currently consuming 100% of his meals.  Body mass index is 27.89 kg/(m^2). Patient meets criteria for normal weight range based on current BMI.   Current diet order is regular, patient is consuming approximately 100% of meals at this time. Labs and medications reviewed.   No nutrition interventions warranted at this time. If nutrition issues arise, please consult RD.   Layton Naves A. Mayford KnifeWilliams, RD, LDN, CDE Pager: [phone] After hours Pager: [phone]

## 2014-08-14 NOTE — ED Notes (Signed)
Walter NajjarLarry, pts son to be contacted for discharge, [phone].

## 2014-08-14 NOTE — ED Notes (Signed)
Transporting patient to new room assignment. 

## 2014-08-14 NOTE — ED Provider Notes (Addendum)
CSN: [id number]     Arrival date & time 08/13/14  2332 History  This chart was scribed for Tomasita Crumble, MD by Annye Asa, ED Scribe. This patient was seen in room A03C/A03C and the patient's care was started at 2:16 AM.    Chief Complaint  Patient presents with  . Abscess  . Insect Bite   The history is provided by the patient. No language interpreter was used.    HPI Comments: Walter Holder is a 59 y.o. male who presents to the Emergency Department complaining of pain, redness and swelling to the lower right leg with chills, nausea, and vomiting. His pain is exacerbated by bearing weight on the right leg and palpation. Patient believes he was bitten by an insect yesterday while trying on old clothes; he circled the original area of redness but the area has extended far beyond the demarcation.   Patient also reports removing a tick from his left-mid back yesterday.   PCP Dr. Jarome Matin with [facility] He is a nonsmoker and occasional EtOH user.   Past Medical History  Diagnosis Date  . Complication of anesthesia     "my ears rang before I went out" (02/28/2012)  . C. difficile diarrhea ~ 2008; 02/28/2012  . Anginal pain ~ 2007  . GERD (gastroesophageal reflux disease)   . Chronic lower back pain   . Arthritis     "might be getting some" (02/28/2012)  . Stress at home     "from my daughter mostly" (02/28/2012)   Past Surgical History  Procedure Laterality Date  . Multiple tooth extractions  1990's    "3" (02/28/2012)  . Cardiac catheterization  ~ 2007   No family history on file. History  Substance Use Topics  . Smoking status: Never Smoker   . Smokeless tobacco: Never Used  . Alcohol Use: No     Comment: 02/28/2012 "5-6 beers on the weekends"    Review of Systems  All other systems reviewed and are negative.   Allergies  Review of patient's allergies indicates no known allergies.  Home Medications   Prior to Admission medications   Medication Sig  Start Date End Date Taking? Authorizing Provider  doxycycline (VIBRAMYCIN) 100 MG capsule Take 1 capsule (100 mg total) by mouth 2 (two) times daily. One po bid x 7 days 05/06/14   Zadie Rhine, MD  folic acid (FOLVITE) 1 MG tablet Take 1 tablet (1 mg total) by mouth daily. For low folate 04/05/14   Sanjuana Kava, NP  HYDROcodone-acetaminophen (NORCO/VICODIN) 5-325 MG per tablet Take 1 tablet by mouth every 6 (six) hours as needed for moderate pain or severe pain (pain).    Historical Provider, MD  Multiple Vitamin (MULTIVITAMIN WITH MINERALS) TABS tablet Take 1 tablet by mouth daily. For low vitamin 04/05/14   Sanjuana Kava, NP  OLANZapine zydis (ZYPREXA) 5 MG disintegrating tablet Take 1 tablet (5 mg total) by mouth at bedtime. For mood control 04/05/14   Sanjuana Kava, NP  sertraline (ZOLOFT) 25 MG tablet Take 5 tablets (125 mg total) by mouth daily. For depression 04/05/14   Sanjuana Kava, NP  temazepam (RESTORIL) 30 MG capsule Take 1 capsule (30 mg total) by mouth at bedtime. For sleep 04/05/14   Sanjuana Kava, NP   BP 113/92 mmHg  Pulse 111  Temp(Src) 99.4 F (37.4 C) (Oral)  Resp 18  SpO2 100% Physical Exam  Constitutional: He is oriented to person, place, and time. He appears  well-developed and well-nourished. No distress.  HENT:  Head: Normocephalic and atraumatic.  Mouth/Throat: Oropharynx is clear and moist. No oropharyngeal exudate.  Moist mucous membranes  Eyes: EOM are normal. Pupils are equal, round, and reactive to light.  Neck: Normal range of motion. Neck supple. No JVD present.  Cardiovascular: Normal rate, regular rhythm and normal heart sounds.  Exam reveals no gallop and no friction rub.   No murmur heard. Pulmonary/Chest: Effort normal and breath sounds normal. No respiratory distress. He has no wheezes. He has no rales.  Abdominal: Soft. Bowel sounds are normal. He exhibits no mass. There is no tenderness. There is no rebound and no guarding.  Musculoskeletal: Normal  range of motion. He exhibits no edema.  Moves all extremities normally.   Lymphadenopathy:    He has no cervical adenopathy.  Neurological: He is alert and oriented to person, place, and time. He displays normal reflexes.  Skin: Skin is warm and dry. Rash noted. There is erythema.  Large 5 cm central area of erythema with central areas of drainage located on the right medial tibia. There is a surrounding area of erythema and cellulitis approximately 15 tenderness in length. Also warm and tender.  Psychiatric: He has a normal mood and affect. His behavior is normal.  Nursing note and vitals reviewed.   ED Course  Procedures   DIAGNOSTIC STUDIES: Oxygen Saturation is 97% on RA, adequate by my interpretation.    COORDINATION OF CARE: 2:21 AM Discussed treatment plan with pt at bedside and pt agreed to plan.   Labs Review Labs Reviewed  CBC WITH DIFFERENTIAL/PLATELET - Abnormal; Notable for the following:    WBC 14.1 (*)    Neutro Abs 10.6 (*)    Monocytes Absolute 1.1 (*)    All other components within normal limits  COMPREHENSIVE METABOLIC PANEL - Abnormal; Notable for the following:    Glucose, Bld 102 (*)    Creatinine, Ser 1.43 (*)    ALT 16 (*)    GFR calc non Af Amer 53 (*)    All other components within normal limits  CULTURE, BLOOD (ROUTINE X 2)  CULTURE, BLOOD (ROUTINE X 2)  ROCKY MTN SPOTTED FVR ABS PNL(IGG+IGM)  B. BURGDORFI ANTIBODIES  I-STAT CG4 LACTIC ACID, ED    Imaging Review No results found.   EKG Interpretation None      MDM   Final diagnoses:  None    Patient presents emergency department for insect bite, growing abscess and cellulitis. Exam reveals large area of erythema and warmth. There is a central area of drainage. There is no fluctuance. Bedside ultrasound is negative for abscess to drain. Patient is also tachycardic and febrile. His vital signs abnormalities patient needs to be admitted for IV antibodies. Cultures were drawn. He was given  vancomycin emergency. Department. I spoke with Dr. Everardo PacificKarakakandy from [facility] who will admit the patient to MedSurg for continued management.  I personally performed the services described in this documentation, which was scribed in my presence. The recorded information has been reviewed and is accurate.     Tomasita CrumbleAdeleke Hatsumi Steinhart, MD 08/14/14 [id number]  Tomasita CrumbleAdeleke Teresa Lemmerman, MD 08/14/14 [id number]

## 2014-08-14 NOTE — Progress Notes (Signed)
Utilization review completed.  

## 2014-08-14 NOTE — Progress Notes (Signed)
Patient admitted after midnight, please see H&P. Increased pain meds to home dose D/c IVF Cont IV abx  Walter CanaryJessica Doyle Holder

## 2014-08-15 ENCOUNTER — Inpatient Hospital Stay (HOSPITAL_COMMUNITY): Payer: Medicaid Other

## 2014-08-15 DIAGNOSIS — D72829 Elevated white blood cell count, unspecified: Secondary | ICD-10-CM

## 2014-08-15 DIAGNOSIS — N179 Acute kidney failure, unspecified: Secondary | ICD-10-CM

## 2014-08-15 DIAGNOSIS — F329 Major depressive disorder, single episode, unspecified: Secondary | ICD-10-CM

## 2014-08-15 LAB — ROCKY MTN SPOTTED FVR ABS PNL(IGG+IGM)
RMSF IgG: NEGATIVE
RMSF IgM: 0.24 index (ref 0.00–0.89)

## 2014-08-15 LAB — B. BURGDORFI ANTIBODIES

## 2014-08-15 NOTE — Progress Notes (Signed)
PROGRESS NOTE  Walter Holder ZOX:[id number] DOB: 07/20/1955 DOA: 08/14/2014 PCP: Garlan Fillers, MD  Assessment/Plan:  right lower extremity cellulitis - sonogram does not show any definite evidence for abscess.  -vanc -x ray of LE  Acute renal failure - -resolved with IVF  Tick bite - patient has been placed on doxycycline.  -await RMSF and Lyme titers.  History of depression - patient states he was not taking his medication for last few days. Patient used to be on Zoloft which has been restarted. Denies any suicidal ideation.  Chronic low back pain - continue home medications  Leukocytosis -decreasing -trend with labs in the AM   Code Status: full Family Communication: patient Disposition Plan:    Consultants:    Procedures:      HPI/Subjective: Pain with walking in his right LE- says it feels like a broken bone  Objective: Filed Vitals:   08/15/14 0508  BP: 112/61  Pulse: 98  Temp: 99.6 F (37.6 C)  Resp: 18    Intake/Output Summary (Last 24 hours) at 08/15/14 0947 Last data filed at 08/15/14 0905  Gross per 24 hour  Intake   2090 ml  Output    650 ml  Net   1440 ml   Filed Weights   08/14/14 0500  Weight: 85.7 kg (188 lb 15 oz)    Exam:   General:  A+Ox3, NAD  Cardiovascular: rrr  Respiratory: clear  Musculoskeletal: less erythema in outside area but increasing around bite mark   Data Reviewed: Basic Metabolic Panel:  Recent Labs Lab 08/13/14 2342 08/14/14 0615  NA 138 137  K 3.6 3.4*  CL 103 106  CO2 24 26  GLUCOSE 102* 120*  BUN 16 11  CREATININE 1.43* 1.01  CALCIUM 8.9 8.1*   Liver Function Tests:  Recent Labs Lab 08/13/14 2342 08/14/14 0615  AST 17 13*  ALT 16* 13*  ALKPHOS 70 54  BILITOT 0.8 0.7  PROT 6.7 5.7*  ALBUMIN 3.7 2.9*   No results for input(s): LIPASE, AMYLASE in the last 168 hours. No results for input(s): AMMONIA in the last 168 hours. CBC:  Recent Labs Lab 08/13/14 2342  08/14/14 0615  WBC 14.1* 12.3*  NEUTROABS 10.6* 8.9*  HGB 13.4 11.9*  HCT 39.9 35.4*  MCV 88.9 87.8  PLT 179 142*   Cardiac Enzymes: No results for input(s): CKTOTAL, CKMB, CKMBINDEX, TROPONINI in the last 168 hours. BNP (last 3 results) No results for input(s): BNP in the last 8760 hours.  ProBNP (last 3 results) No results for input(s): PROBNP in the last 8760 hours.  CBG: No results for input(s): GLUCAP in the last 168 hours.  Recent Results (from the past 240 hour(s))  Culture, blood (routine x 2)     Status: None (Preliminary result)   Collection Time: 08/14/14  2:25 AM  Result Value Ref Range Status   Specimen Description BLOOD LEFT ARM  Final   Special Requests BOTTLES DRAWN AEROBIC AND ANAEROBIC 5CC  Final   Culture   Final           BLOOD CULTURE RECEIVED NO GROWTH TO DATE CULTURE WILL BE HELD FOR 5 DAYS BEFORE ISSUING A FINAL NEGATIVE REPORT Performed at [facility]    Report Status PENDING  Incomplete  Culture, blood (routine x 2)     Status: None (Preliminary result)   Collection Time: 08/14/14  2:30 AM  Result Value Ref Range Status   Specimen Description BLOOD RIGHT ARM  Final  Special Requests BOTTLES DRAWN AEROBIC AND ANAEROBIC 5CC  Final   Culture   Final           BLOOD CULTURE RECEIVED NO GROWTH TO DATE CULTURE WILL BE HELD FOR 5 DAYS BEFORE ISSUING A FINAL NEGATIVE REPORT [facility]    Report Status PENDING  Incomplete     Studies: No results found.  Scheduled Meds: . doxycycline (VIBRAMYCIN) IV  100 mg Intravenous Q12H  . enoxaparin (LOVENOX) injection  40 mg Subcutaneous Q24H  . multivitamin with minerals  1 tablet Oral Daily  . sertraline  125 mg Oral Daily  . tamsulosin  0.4 mg Oral Daily  . vancomycin  1,000 mg Intravenous Q12H  . vitamin B-12  1,000 mcg Oral Daily   Continuous Infusions:  Antibiotics Given (last 72 hours)    Date/Time Action Medication Dose Rate   08/14/14 0644 Given   doxycycline  (VIBRAMYCIN) 100 mg in dextrose 5 % 250 mL IVPB 100 mg 125 mL/hr   08/14/14 1616 Given   vancomycin (VANCOCIN) IVPB 1000 mg/200 mL premix 1,000 mg 200 mL/hr   08/14/14 1749 Given   doxycycline (VIBRAMYCIN) 100 mg in dextrose 5 % 250 mL IVPB 100 mg 125 mL/hr   08/15/14 0405 Given   vancomycin (VANCOCIN) IVPB 1000 mg/200 mL premix 1,000 mg 200 mL/hr   08/15/14 0608 Given   doxycycline (VIBRAMYCIN) 100 mg in dextrose 5 % 250 mL IVPB 100 mg 125 mL/hr      Principal Problem:   Cellulitis of right lower extremity Active Problems:   Low back pain    Time spent: 25 min    VANN, JESSICA  [facility] Pager [phone]. If 7PM-7AM, please contact night-coverage at [email or web address], password [email or web address] 08/15/2014, 9:47 AM  LOS: 1 day

## 2014-08-15 NOTE — Hospital Discharge Follow-Up (Signed)
This RN met with patient again at bedside and reminded of [facility] appointment on 08/22/14 at 25 with Dr. Jarold Song.  Patient verbalized understanding. Will follow patient's progress and provide follow-up phone call once discharged.

## 2014-08-16 LAB — CBC
HCT: 38.3 % — ABNORMAL LOW (ref 39.0–52.0)
Hemoglobin: 12.9 g/dL — ABNORMAL LOW (ref 13.0–17.0)
MCH: 29.5 pg (ref 26.0–34.0)
MCHC: 33.7 g/dL (ref 30.0–36.0)
MCV: 87.4 fL (ref 78.0–100.0)
Platelets: 180 10*3/uL (ref 150–400)
RBC: 4.38 MIL/uL (ref 4.22–5.81)
RDW: 14.6 % (ref 11.5–15.5)
WBC: 8.8 10*3/uL (ref 4.0–10.5)

## 2014-08-16 LAB — BASIC METABOLIC PANEL
Anion gap: 7 (ref 5–15)
BUN: 12 mg/dL (ref 6–20)
CHLORIDE: 101 mmol/L (ref 101–111)
CO2: 29 mmol/L (ref 22–32)
CREATININE: 0.89 mg/dL (ref 0.61–1.24)
Calcium: 9.1 mg/dL (ref 8.9–10.3)
Glucose, Bld: 104 mg/dL — ABNORMAL HIGH (ref 65–99)
Potassium: 4.1 mmol/L (ref 3.5–5.1)
SODIUM: 137 mmol/L (ref 135–145)

## 2014-08-16 LAB — PROCALCITONIN: Procalcitonin: <0.1 ng/mL

## 2014-08-16 MED ORDER — ALUM & MAG HYDROXIDE-SIMETH 200-200-20 MG/5ML PO SUSP
30.0000 mL | Freq: Once | ORAL | Status: AC
  Administered 2014-08-16: 30 mL via ORAL
  Filled 2014-08-16: qty 30

## 2014-08-16 NOTE — Progress Notes (Signed)
PROGRESS NOTE  Walter Holder ZOX:[id number] DOB: June 08, 1955 DOA: 08/14/2014 PCP: Garlan Fillers, MD  Assessment/Plan:  right lower extremity cellulitis - sonogram does not show any definite evidence for abscess.  -vanc/doxy -x ray of LE- is ok- will ask surgery to see if needs lanced Apply heat  Acute renal failure - -resolved with IVF  Tick bite - patient has been placed on doxycycline- d/c doxy -RMSF and Lyme titers negative.  History of depression - patient states he was not taking his medication for last few days. Patient used to be on Zoloft which has been restarted. Denies any suicidal ideation.  Chronic low back pain - continue home medications  Leukocytosis -decreasing -trend with labs in the AM   Code Status: full Family Communication: patient Disposition Plan:    Consultants:    Procedures:      HPI/Subjective: No fever, no chills, bad pain with walking  Objective: Filed Vitals:   08/16/14 0538  BP: 107/62  Pulse:   Temp: 98.1 F (36.7 C)  Resp: 14    Intake/Output Summary (Last 24 hours) at 08/16/14 0921 Last data filed at 08/16/14 8119  Gross per 24 hour  Intake   2110 ml  Output    450 ml  Net   1660 ml   Filed Weights   08/14/14 0500  Weight: 85.7 kg (188 lb 15 oz)    Exam:   General:  A+Ox3, NAD  Cardiovascular: rrr  Respiratory: clear  Musculoskeletal: less erythema in outside area but increasing around bite mark with what appears to be a collection   Data Reviewed: Basic Metabolic Panel:  Recent Labs Lab 08/13/14 2342 08/14/14 0615 08/16/14 0445  NA 138 137 137  K 3.6 3.4* 4.1  CL 103 106 101  CO2 GLUCOSE 102* 120* 104*  BUN CREATININE 1.43* 1.01 0.89  CALCIUM 8.9 8.1* 9.1   Liver Function Tests:  Recent Labs Lab 08/13/14 2342 08/14/14 0615  AST 17 13*  ALT 16* 13*  ALKPHOS 70 54  BILITOT 0.8 0.7  PROT 6.7 5.7*  ALBUMIN 3.7 2.9*   No results for input(s): LIPASE, AMYLASE  in the last 168 hours. No results for input(s): AMMONIA in the last 168 hours. CBC:  Recent Labs Lab 08/13/14 2342 08/14/14 0615 08/16/14 0445  WBC 14.1* 12.3* 8.8  NEUTROABS 10.6* 8.9*  --   HGB 13.4 11.9* 12.9*  HCT 39.9 35.4* 38.3*  MCV 88.9 87.8 87.4  PLT 179 142* 180   Cardiac Enzymes: No results for input(s): CKTOTAL, CKMB, CKMBINDEX, TROPONINI in the last 168 hours. BNP (last 3 results) No results for input(s): BNP in the last 8760 hours.  ProBNP (last 3 results) No results for input(s): PROBNP in the last 8760 hours.  CBG: No results for input(s): GLUCAP in the last 168 hours.  Recent Results (from the past 240 hour(s))  Culture, blood (routine x 2)     Status: None (Preliminary result)   Collection Time: 08/14/14  2:25 AM  Result Value Ref Range Status   Specimen Description BLOOD LEFT ARM  Final   Special Requests BOTTLES DRAWN AEROBIC AND ANAEROBIC 5CC  Final   Culture   Final           BLOOD CULTURE RECEIVED NO GROWTH TO DATE CULTURE WILL BE HELD FOR 5 DAYS BEFORE ISSUING A FINAL NEGATIVE REPORT Performed at [facility]    Report Status PENDING  Incomplete  Culture, blood (routine  x 2)     Status: None (Preliminary result)   Collection Time: 08/14/14  2:30 AM  Result Value Ref Range Status   Specimen Description BLOOD RIGHT ARM  Final   Special Requests BOTTLES DRAWN AEROBIC AND ANAEROBIC 5CC  Final   Culture   Final           BLOOD CULTURE RECEIVED NO GROWTH TO DATE CULTURE WILL BE HELD FOR 5 DAYS BEFORE ISSUING A FINAL NEGATIVE REPORT Performed at [facility]    Report Status PENDING  Incomplete     Studies: Dg Tibia/fibula Right  08/15/2014   CLINICAL DATA:  Insect bite medial right lower leg 3 days ago with worsening redness, fever and chills.  EXAM: RIGHT TIBIA AND FIBULA - 2 VIEW  COMPARISON:  None.  FINDINGS: Subtle spurring at the patellofemoral joint. No significant focal soft tissue abnormality. No air within the soft  tissues. Underlying bony structures otherwise within normal.  IMPRESSION: Negative.   Electronically Signed   By: Elberta Fortisaniel  Boyle M.D.   On: 08/15/2014 11:21    Scheduled Meds: . doxycycline (VIBRAMYCIN) IV  100 mg Intravenous Q12H  . enoxaparin (LOVENOX) injection  40 mg Subcutaneous Q24H  . multivitamin with minerals  1 tablet Oral Daily  . sertraline  125 mg Oral Daily  . tamsulosin  0.4 mg Oral Daily  . vancomycin  1,000 mg Intravenous Q12H  . vitamin B-12  1,000 mcg Oral Daily   Continuous Infusions:  Antibiotics Given (last 72 hours)    Date/Time Action Medication Dose Rate   08/14/14 0644 Given   doxycycline (VIBRAMYCIN) 100 mg in dextrose 5 % 250 mL IVPB 100 mg 125 mL/hr   08/14/14 1616 Given   vancomycin (VANCOCIN) IVPB 1000 mg/200 mL premix 1,000 mg 200 mL/hr   08/14/14 1749 Given   doxycycline (VIBRAMYCIN) 100 mg in dextrose 5 % 250 mL IVPB 100 mg 125 mL/hr   08/15/14 0405 Given   vancomycin (VANCOCIN) IVPB 1000 mg/200 mL premix 1,000 mg 200 mL/hr   08/15/14 [id number] Given   doxycycline (VIBRAMYCIN) 100 mg in dextrose 5 % 250 mL IVPB 100 mg 125 mL/hr   08/15/14 1520 Given   vancomycin (VANCOCIN) IVPB 1000 mg/200 mL premix 1,000 mg 200 mL/hr   08/15/14 1734 Given   doxycycline (VIBRAMYCIN) 100 mg in dextrose 5 % 250 mL IVPB 100 mg 125 mL/hr   08/16/14 0424 Given   vancomycin (VANCOCIN) IVPB 1000 mg/200 mL premix 1,000 mg 200 mL/hr   08/16/14 0620 Given   doxycycline (VIBRAMYCIN) 100 mg in dextrose 5 % 250 mL IVPB 100 mg 125 mL/hr      Principal Problem:   Cellulitis of right lower extremity Active Problems:   Low back pain    Time spent: 25 min    Cher Franzoni  [facility] Pager [phone]. If 7PM-7AM, please contact night-coverage at [email or web address], password [email or web address] 08/16/2014, 9:21 AM  LOS: 2 days

## 2014-08-17 MED ORDER — HYDROCODONE-ACETAMINOPHEN 5-325 MG PO TABS: 1.0000 | ORAL_TABLET | ORAL | Status: DC | PRN

## 2014-08-17 MED ORDER — DOXYCYCLINE HYCLATE 100 MG PO TABS
100.0000 mg | ORAL_TABLET | Freq: Two times a day (BID) | ORAL | Status: DC
  Administered 2014-08-17: 100 mg via ORAL
  Filled 2014-08-17 (×2): qty 1

## 2014-08-17 MED ORDER — DOXYCYCLINE HYCLATE 100 MG PO TABS: 100.0000 mg | ORAL_TABLET | Freq: Two times a day (BID) | ORAL | Status: DC

## 2014-08-17 NOTE — Progress Notes (Signed)
Patient was discharged home by MD order; discharged instructions  review and give to patient with care notes and prescriptions; IV DIC; patient will be escorted to the car by nurse tech via wheelchair.  

## 2014-08-17 NOTE — Discharge Summary (Signed)
Physician Discharge Summary  Walter Holder BJY:[id number] DOB: Jul 19, 1955 DOA: 08/14/2014  PCP: Walter Fillers, Holder  Admit date: 08/14/2014 Discharge date: 08/17/2014  Time spent: 35 minutes  Recommendations for Outpatient Follow-up:  1. warm compresses 2. Elevated extremitity  Discharge Diagnoses:  Principal Problem:   Cellulitis of right lower extremity Active Problems:   Low back pain   Discharge Condition: improved  Diet recommendation: cardiac  Filed Weights   08/14/14 0500  Weight: 85.7 kg (188 lb 15 oz)    History of present illness:  Walter Holder is a 59 y.o. male with history of chronic low back pain and depression presents to the ER because of worsening pain in the right lower extremity with swelling. Patient states he may have been bitten by an insect but not sure but started developing swelling and redness with fever and chills in the right lower extremity. Since the swelling progressed patient came to the ER. In the ER patient was found to be tachycardic and mildly febrile. In addition patient states his son take the tick from his back 3 days ago. In the ER patient on exam has a right lower extremity erythema and swelling but on sonogram exam there was no definite abscess. Given the patient's elevated temperature at this time patient has been admitted for IV antibodies.    Hospital Course:  right lower extremity cellulitis - sonogram does not show any definite evidence for abscess.  Apply heat- opened and drained on own -change to PO doxy -wound check next week  Acute renal failure - -resolved with IVF  Tick bite - -RMSF and Lyme titers negative.  History of depression - patient states he was not taking his medication for last few days. Patient used to be on Zoloft which has been restarted. Denies any suicidal ideation.  Chronic low back pain - continue home medications  Leukocytosis -resolved  Procedures:    Consultations:    Discharge  Exam: Filed Vitals:   08/17/14 0505  BP: 101/57  Pulse: 83  Temp: 97.9 F (36.6 C)  Resp: 18    General: A+Ox3, NAD  Discharge Instructions   Discharge Instructions    Diet - low sodium heart healthy    Complete by:  As directed      Discharge instructions    Complete by:  As directed   Elevated leg Apply warm compresses     Increase activity slowly    Complete by:  As directed           Current Discharge Medication List    START taking these medications   Details  doxycycline (VIBRA-TABS) 100 MG tablet Take 1 tablet (100 mg total) by mouth every 12 (twelve) hours. Qty: 14 tablet, Refills: 0      CONTINUE these medications which have CHANGED   Details  HYDROcodone-acetaminophen (NORCO/VICODIN) 5-325 MG per tablet Take 1-2 tablets by mouth every 4 (four) hours as needed for moderate pain or severe pain (pain). Qty: 15 tablet, Refills: 0      CONTINUE these medications which have NOT CHANGED   Details  Multiple Vitamin (MULTIVITAMIN WITH MINERALS) TABS tablet Take 1 tablet by mouth daily. For low vitamin Qty: 30 tablet, Refills: 0    sertraline (ZOLOFT) 25 MG tablet Take 5 tablets (125 mg total) by mouth daily. For depression Qty: 150 tablet, Refills: 0    tamsulosin (FLOMAX) 0.4 MG CAPS capsule Take 0.4 mg by mouth daily.    vitamin B-12 (CYANOCOBALAMIN) 1000 MCG tablet Take  1,000 mcg by mouth daily.      STOP taking these medications     OLANZapine zydis (ZYPREXA) 5 MG disintegrating tablet      temazepam (RESTORIL) 30 MG capsule        No Known Allergies Follow-up Information    Follow up with [facility]     On 08/22/2014.   Why:  Transitional [facility] Clinic Appointment 08/22/14 at 10:30am with Walter Holder   Contact information:   [address] [place] [id number] [phone]      Follow up with Walter Holder In 1 week.   Specialty:  Internal Medicine   Contact information:   [address] [place] [address] [phone]        The results of significant diagnostics from this hospitalization (including imaging, microbiology, ancillary and laboratory) are listed below for reference.    Significant Diagnostic Studies: Dg Tibia/fibula Right  08/15/2014   CLINICAL DATA:  Insect bite medial right lower leg 3 days ago with worsening redness, fever and chills.  EXAM: RIGHT TIBIA AND FIBULA - 2 VIEW  COMPARISON:  None.  FINDINGS: Subtle spurring at the patellofemoral joint. No significant focal soft tissue abnormality. No air within the soft tissues. Underlying bony structures otherwise within normal.  IMPRESSION: Negative.   Electronically Signed   By: Elberta Fortisaniel  Boyle M.D.   On: 08/15/2014 11:21    Microbiology: Recent Results (from the past 240 hour(s))  Culture, blood (routine x 2)     Status: None (Preliminary result)   Collection Time: 08/14/14  2:25 AM  Result Value Ref Range Status   Specimen Description BLOOD LEFT ARM  Final   Special Requests BOTTLES DRAWN AEROBIC AND ANAEROBIC 5CC  Final   Culture   Final           BLOOD CULTURE RECEIVED NO GROWTH TO DATE CULTURE WILL BE HELD FOR 5 DAYS BEFORE ISSUING A FINAL NEGATIVE REPORT Performed at [facility]    Report Status PENDING  Incomplete  Culture, blood (routine x 2)     Status: None (Preliminary result)   Collection Time: 08/14/14  2:30 AM  Result Value Ref Range Status   Specimen Description BLOOD RIGHT ARM  Final   Special Requests BOTTLES DRAWN AEROBIC AND ANAEROBIC 5CC  Final   Culture   Final           BLOOD CULTURE RECEIVED NO GROWTH TO DATE CULTURE WILL BE HELD FOR 5 DAYS BEFORE ISSUING A FINAL NEGATIVE REPORT Performed at [facility]    Report Status PENDING  Incomplete     Labs: Basic Metabolic Panel:  Recent Labs Lab 08/13/14 2342 08/14/14 0615 08/16/14 0445  NA 138 137 137  K 3.6 3.4* 4.1  CL 103 106 101  CO2 24 26 29   GLUCOSE 102* 120* 104*  BUN 16 11 12    CREATININE 1.43* 1.01 0.89  CALCIUM 8.9 8.1* 9.1   Liver Function Tests:  Recent Labs Lab 08/13/14 2342 08/14/14 0615  AST 17 13*  ALT 16* 13*  ALKPHOS 70 54  BILITOT 0.8 0.7  PROT 6.7 5.7*  ALBUMIN 3.7 2.9*   No results for input(s): LIPASE, AMYLASE in the last 168 hours. No results for input(s): AMMONIA in the last 168 hours. CBC:  Recent Labs Lab 08/13/14 2342 08/14/14 0615 08/16/14 0445  WBC 14.1* 12.3* 8.8  NEUTROABS 10.6* 8.9*  --   HGB 13.4 11.9* 12.9*  HCT 39.9  35.4* 38.3*  MCV 88.9 87.8 87.4  PLT 179 142* 180   Cardiac Enzymes: No results for input(s): CKTOTAL, CKMB, CKMBINDEX, TROPONINI in the last 168 hours. BNP: BNP (last 3 results) No results for input(s): BNP in the last 8760 hours.  ProBNP (last 3 results) No results for input(s): PROBNP in the last 8760 hours.  CBG: No results for input(s): GLUCAP in the last 168 hours.     SignedMarlin Canary  [facility] 08/17/2014, 10:05 AM

## 2014-08-19 ENCOUNTER — Telehealth: Payer: Self-pay

## 2014-08-19 NOTE — Telephone Encounter (Signed)
CM called patient at # [phone] for post discharge follow up. His daughter in law, [place], answered and informed CM that he was not there.  She said that he was out and she would have him call CM back

## 2014-08-20 ENCOUNTER — Telehealth: Payer: Self-pay

## 2014-08-20 LAB — CULTURE, BLOOD (ROUTINE X 2)
CULTURE: NO GROWTH
Culture: NO GROWTH

## 2014-08-20 NOTE — Telephone Encounter (Signed)
Called patient # [phone] to check on his status and confirm his appointment in the Transitional [facility] Clinic. Voice mail message left requesting a call back.  Call also made to his spouse # [phone] , as patient gave permission to contact her. Voice mail message left requesting a call back.

## 2014-08-21 ENCOUNTER — Telehealth: Payer: Self-pay

## 2014-08-21 NOTE — Telephone Encounter (Signed)
Transitional [facility] Clinic Post-discharge Follow-Up Phone Call:  Date of Discharge: 08/17/14 Principal Discharge Diagnosis: Cellulitis of right lower extremity Post-discharge Communication: Attempt # 3 Call Completed: Yes                   With Whom: Patient     Please check all that apply:  X  Patient is knowledgeable of his/her condition(s) and/or treatment. X Patient is caring for self at home.  ? Patient is receiving assist at home from family and/or caregiver. Family and/or caregiver is knowledgeable of patient's condition(s) and/or treatment. ? Patient is receiving home health services. If so, name of agency.     Medication Reconciliation:  X  Patient obtained all discharge medications. Patient obtained both new discharge medications. Stressed importance of taking all of doxycycline and taking as prescribed. Patient verbalized understanding.     Activities of Daily Living:  X  Independent ? Needs assist (describe; ? home DME used) ? Total [facility] (describe, ? home DME used)   Community resources in place for patient:  X  None  ? Home Health/Home DME ? Assisted Living ? Support Group          Patient Education: Patient reminded of [facility]itional [facility] Clinic appointment on 08/22/14 at 1030 with Dr. Venetia NightAmao. Patient verbalized understanding, and he indicates he has transportation to his appointment.  New discharge medications discussed.        Questions/Concerns discussed: Patient indicates his right lower extremity is overall looking better. He indicates area is less painful, and he denies fever. He indicates area still has some swelling.  Patient taking discharge medications as prescribed.  Stressed importance of taking doxycycline as prescribed. Patient verbalized understanding.

## 2014-08-22 ENCOUNTER — Encounter: Payer: Self-pay | Admitting: Family Medicine

## 2014-08-22 ENCOUNTER — Ambulatory Visit: Payer: Medicaid Other | Attending: Family Medicine | Admitting: Family Medicine

## 2014-08-22 VITALS — BP 109/75 | HR 89 | Temp 98.0°F | Resp 18 | Ht 68.0 in | Wt 206.2 lb

## 2014-08-22 DIAGNOSIS — F333 Major depressive disorder, recurrent, severe with psychotic symptoms: Secondary | ICD-10-CM | POA: Diagnosis not present

## 2014-08-22 DIAGNOSIS — L03115 Cellulitis of right lower limb: Secondary | ICD-10-CM

## 2014-08-22 DIAGNOSIS — F329 Major depressive disorder, single episode, unspecified: Secondary | ICD-10-CM | POA: Insufficient documentation

## 2014-08-22 DIAGNOSIS — M545 Low back pain: Secondary | ICD-10-CM | POA: Insufficient documentation

## 2014-08-22 DIAGNOSIS — N400 Enlarged prostate without lower urinary tract symptoms: Secondary | ICD-10-CM | POA: Insufficient documentation

## 2014-08-22 MED ORDER — SERTRALINE HCL 25 MG PO TABS: 125.0000 mg | ORAL_TABLET | Freq: Every day | ORAL | Status: DC

## 2014-08-22 MED ORDER — TAMSULOSIN HCL 0.4 MG PO CAPS: 0.4000 mg | ORAL_CAPSULE | Freq: Every day | ORAL | Status: DC

## 2014-08-22 NOTE — Progress Notes (Signed)
[facility] patient here for hospital follow up for cellulitis on RLE.  Patient states he has trouble with short term memory since December.  Patient still taking antibiotics.  Patient states he is still in pain but not as bad as it was.  Pain level 6/10 in back, pain is constant.  Foot pain is 6/10.  Patient uses Norco which helps relieve pain "to a point."  Patient states it "feels like something is crawling across my right leg" ever since the spider bite.  Patient has questions about Resteril.  Patient has PCP.

## 2014-08-22 NOTE — Progress Notes (Signed)
Date of Telephone call:08/21/14  Date of 1st service:08/22/14    Admit Date:5/26//16 Discharge Date: 08/17/14  PCP: Walter Holder  HPI Walter Holder is a 59 year old male with a history of [facility], low back pain who presented to the ED with right lower extremity swelling, pain and redness.  On presentation he was found to be febrile and was admitted for cellulitis and developing sepsis. Ultrasound of his lower extremity was negative for abscess. He was placed on IV vancomycin and doxycycline; Lyme titers and [facility] [place] spotted fever titers were sent off which came back negative. He did have some leukocytosis of 14.1 which trended down to 8.8 on discharge. His condition improved and the right lower extremity swelling reduced and he was discharged on doxycycline for 14 days as scheduled for an outpatient follow-up.   Interval History: States his right leg is doing better and he is currently on Doxycycline. Also has memory issues for which he is undergoing work up by his PCP Goes to [facility] where [facility] is managed for Depression.  Health Care needs: Medicaid is pending and he has difficulty obtaining medications due to financial constraints He plans on going back to his PCP as he states he will work with him regarding his payments  REVIEW OF SYSTEMS General: negative for fever, weight loss, appetite change Eyes: no visual symptoms. ENT: no ear symptoms, no sinus tenderness, no nasal congestion or sore throat. Neck: no pain  Respiratory: no wheezing, shortness of breath, cough Cardiovascular: no chest pain, no dyspnea on exertion, no pedal edema, no orthopnea. Gastrointestinal: no abdominal pain, no diarrhea, no constipation Genito-Urinary: no urinary frequency, no dysuria, no polyuria. Hematologic: no bruising Endocrine: no cold or heat intolerance Neurological: no headaches, no seizures, no tremors Musculoskeletal: see HPI Skin: no pruritus, no  rash. Psychological: no depression, no anxiety,     PHYSICAL EXAM Filed Vitals:   08/22/14 1047  BP: 109/75  Pulse: 89  Temp: 98 F (36.7 C)  TempSrc: Oral  Resp: 18  Height: 5\' 8"  (1.727 m)  Weight: 206 lb 3.2 oz (93.532 kg)  SpO2: 97%   Constitutional: normal appearing,  Eyes: PERRLA HEENT: Head is atraumatic, normal sinuses, normal oropharynx, normal appearing tonsils and palate, tympanic membrane is normal bilaterally. Neck: normal range of motion, no thyromegaly, no JVD Cardiovascular: normal rate and rhythm, normal heart sounds, no murmurs, rub or gallop, no pedal edema Respiratory: clear to auscultation bilaterally, no wheezes, no rales, no rhonchi Abdomen: soft, not tender to palpation, normal bowel sounds, no enlarged organs Extremities: Right lower extremity with lesion on the medial aspect of his leg with a central pinpoint area of central scarring and surrounding erythema which is tender to palpation. No discharge noted. Skin: warm and dry, no lesions. Neurological: alert, oriented x3, cranial nerves I-XII grossly intact , normal motor strength, normal sensation. Psychological: normal mood.   CLINICAL DATA: Insect bite medial right lower leg 3 days ago with worsening redness, fever and chills.  EXAM: RIGHT TIBIA AND FIBULA - 2 VIEW  COMPARISON: None.  FINDINGS: Subtle spurring at the patellofemoral joint. No significant focal soft tissue abnormality. No air within the soft tissues. Underlying bony structures otherwise within normal.  IMPRESSION: Negative.   Electronically Signed  By: Elberta Fortisaniel Boyle M.D.  On: 08/15/2014 11:21  ASSESMENT/PLAN 59 year old male with a history of [facility], low back pain recently hospitalized for right lower extremity cellulitis and developing sepsis currently on doxycycline.  Right lower extremity cellulitis: Resolving.  Advised to complete antibiotic. Area of cellulitis has been marked for  monitoring of resolution and the patient wishes to return to his PCP Dr. Jarold Motto and an appointment has been set up for next week.  Depression: Management as per psychiatry at [facility].  Benign prostatic hyperplasia: Refill Flomax.   Care Goals: Patient was to be set up with financial counselor but given the fact that he will be returning to his PCP is unsure of how effective this will be, meanwhile medications will be sent to pharmacy on site.

## 2014-08-28 ENCOUNTER — Telehealth: Payer: Self-pay | Admitting: Clinical

## 2014-08-28 NOTE — Telephone Encounter (Signed)
F/U with pt; pt concerned about paying for medications and his [facility] hospital bills. Pt has PCP, but is considering switching. Pt advised to make an appointment with financial counselor at [facility] to apply for [facility] discount, and pt. Agreed to make an appointment. He states that he is having short-term memory loss, that he saw his PCP, Dr. Jarold Motto, two days ago, but has not heard back with test results.

## 2014-09-25 ENCOUNTER — Ambulatory Visit: Payer: Self-pay

## 2014-11-19 ENCOUNTER — Emergency Department (INDEPENDENT_AMBULATORY_CARE_PROVIDER_SITE_OTHER)
Admission: EM | Admit: 2014-11-19 | Discharge: 2014-11-19 | Disposition: A | Discharge status: Home / Self Care | Payer: Medicaid Other | Source: Home / Self Care | Attending: Family Medicine | Admitting: Family Medicine

## 2014-11-19 ENCOUNTER — Encounter (HOSPITAL_COMMUNITY): Payer: Self-pay | Admitting: Emergency Medicine

## 2014-11-19 DIAGNOSIS — K047 Periapical abscess without sinus: Secondary | ICD-10-CM

## 2014-11-19 MED ORDER — CLINDAMYCIN HCL 300 MG PO CAPS: 300.0000 mg | ORAL_CAPSULE | Freq: Three times a day (TID) | ORAL | Status: DC

## 2014-11-19 MED ORDER — DICLOFENAC POTASSIUM 50 MG PO TABS: 50.0000 mg | ORAL_TABLET | Freq: Three times a day (TID) | ORAL | Status: DC

## 2014-11-19 NOTE — Discharge Instructions (Signed)
See dentist for further care, use medicine as prescribed, may stop amoxicillin.

## 2014-11-19 NOTE — ED Notes (Signed)
Pt is here for swelling on the left side of his face from an infected and broken tooth.  Pt started taking antibiotics last night.

## 2014-11-19 NOTE — ED Provider Notes (Signed)
CSN: [id number]     Arrival date & time 11/19/14  1939 History   First MD Initiated Contact with Patient 11/19/14 2010     Chief Complaint  Patient presents with  . Dental Pain   (Consider location/radiation/quality/duration/timing/severity/associated sxs/prior Treatment) Patient is a 59 y.o. male presenting with tooth pain. The history is provided by the patient.  Dental Pain Location:  Upper Upper teeth location:  11/LU cuspid Quality:  Throbbing Severity:  Moderate Onset quality:  Gradual Duration:  3 days Chronicity:  Recurrent Context: abscess, dental caries and poor dentition   Relieved by:  None tried Worsened by:  Nothing tried Ineffective treatments:  None tried Associated symptoms: facial pain, facial swelling and gum swelling   Risk factors: lack of dental care     Past Medical History  Diagnosis Date  . Complication of anesthesia     "my ears rang before I went out" (02/28/2012)  . C. difficile diarrhea ~ 2008; 02/28/2012  . Anginal pain ~ 2007  . GERD (gastroesophageal reflux disease)   . Chronic lower back pain   . Arthritis     "might be getting some" (02/28/2012)  . Stress at home     "from my daughter mostly" (02/28/2012)   Past Surgical History  Procedure Laterality Date  . Multiple tooth extractions  1990's    "3" (02/28/2012)  . Cardiac catheterization  ~ 2007   Family History  Problem Relation Age of Onset  . Diabetes Mellitus II Neg Hx   . Alzheimer's disease Mother    Social History  Substance Use Topics  . Smoking status: Never Smoker   . Smokeless tobacco: Never Used  . Alcohol Use: No     Comment: 02/28/2012 "5-6 beers on the weekends" 08/22/14 none.    Review of Systems  Constitutional: Negative.   HENT: Positive for dental problem and facial swelling.     Allergies  Review of patient's allergies indicates no known allergies.  Home Medications   Prior to Admission medications   Medication Sig Start Date End Date Taking?  Authorizing Provider  clindamycin (CLEOCIN) 300 MG capsule Take 1 capsule (300 mg total) by mouth 3 (three) times daily. 11/19/14   Linna Hoff, MD  diclofenac (CATAFLAM) 50 MG tablet Take 1 tablet (50 mg total) by mouth 3 (three) times daily. 11/19/14   Linna Hoff, MD  doxycycline (VIBRA-TABS) 100 MG tablet Take 1 tablet (100 mg total) by mouth every 12 (twelve) hours. 08/17/14   Joseph Art, DO  HYDROcodone-acetaminophen (NORCO/VICODIN) 5-325 MG per tablet Take 1-2 tablets by mouth every 4 (four) hours as needed for moderate pain or severe pain (pain). 08/17/14   Joseph Art, DO  Multiple Vitamin (MULTIVITAMIN WITH MINERALS) TABS tablet Take 1 tablet by mouth daily. For low vitamin 04/05/14   Sanjuana Kava, NP  sertraline (ZOLOFT) 25 MG tablet Take 5 tablets (125 mg total) by mouth daily. For depression 08/22/14   Jaclyn Shaggy, MD  tamsulosin (FLOMAX) 0.4 MG CAPS capsule Take 1 capsule (0.4 mg total) by mouth daily. 08/22/14   Jaclyn Shaggy, MD  vitamin B-12 (CYANOCOBALAMIN) 1000 MCG tablet Take 1,000 mcg by mouth daily.    Historical Provider, MD   Meds Ordered and Administered this Visit  Medications - No data to display  BP 134/76 mmHg  Pulse 85  Temp(Src) 98.1 F (36.7 C) (Oral)  Resp 16  SpO2 100% No data found.   Physical Exam  Constitutional: He is oriented to  person, place, and time. He appears well-developed and well-nourished.  HENT:  Mouth/Throat: Uvula is midline and mucous membranes are normal. Dental abscesses and dental caries present.    Neck: Normal range of motion. Neck supple.  Lymphadenopathy:    He has no cervical adenopathy.  Neurological: He is alert and oriented to person, place, and time.  Skin: Skin is warm and dry.  Nursing note and vitals reviewed.   ED Course  Procedures (including critical care time)  Labs Review Labs Reviewed - No data to display  Imaging Review No results found.   Visual Acuity Review  Right Eye Distance:   Left Eye  Distance:   Bilateral Distance:    Right Eye Near:   Left Eye Near:    Bilateral Near:         MDM   1. Abscess, dental        Linna Hoff, MD 11/19/14 2115

## 2015-01-19 ENCOUNTER — Ambulatory Visit: Payer: Medicaid Other | Admitting: Podiatry

## 2016-07-12 IMAGING — CT CT HEAD W/O CM
2 series · 17 of 30 positions shown, 20 images · non-contrast
Comparison: None.

CLINICAL DATA: Cognitive and behavioral changes including increased
depression with psychosis and hallucinations.

EXAM:
CT HEAD WITHOUT CONTRAST
TECHNIQUE: Contiguous axial images were obtained from the base of the skull
through the vertex without intravenous contrast.

[Series 2: head w/o · axial · non-contrast · 0.41mm/px · z∈[+1225,+1345]mm · 9 of 32 slices shown, 12 images]
[im 4/32  brain]
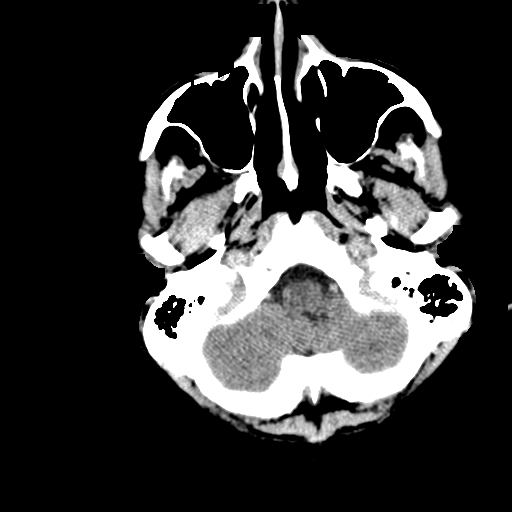
[im 4/32  bone]
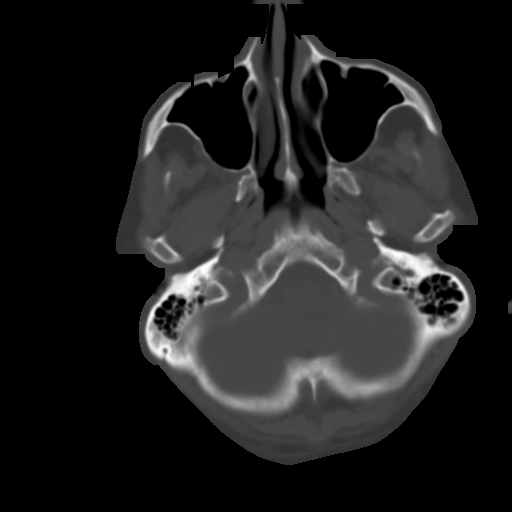
[im 7/32  brain]
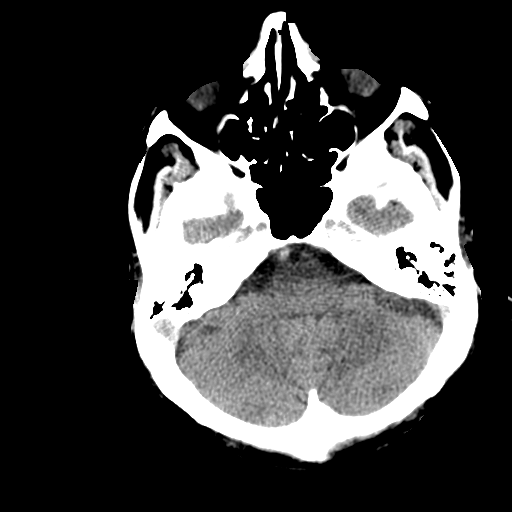
[im 10/32  brain]
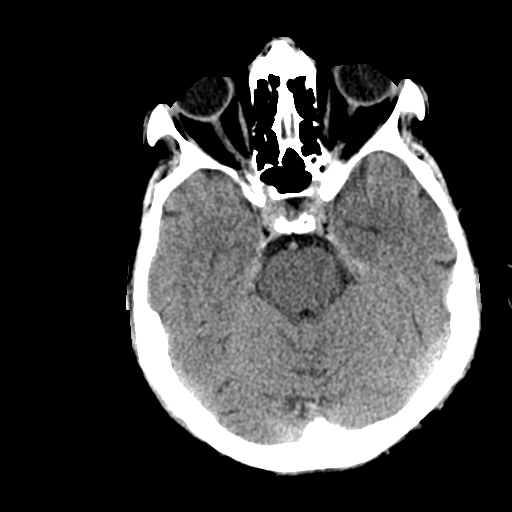
[im 13/32  brain]
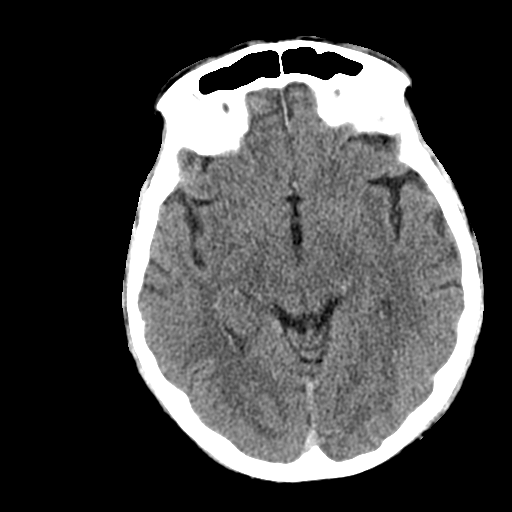
[im 16/32  brain]
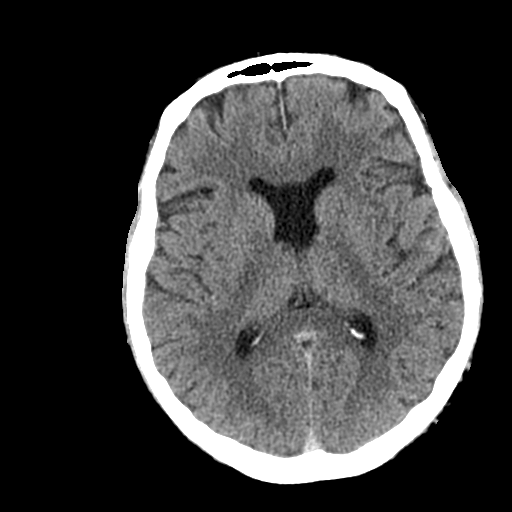
[im 16/32  bone]
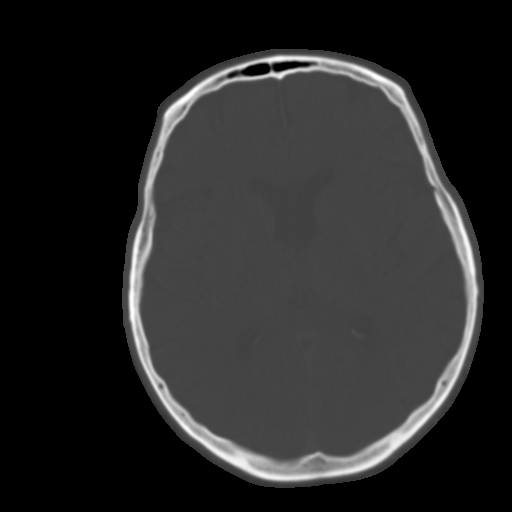
[im 19/32  brain]
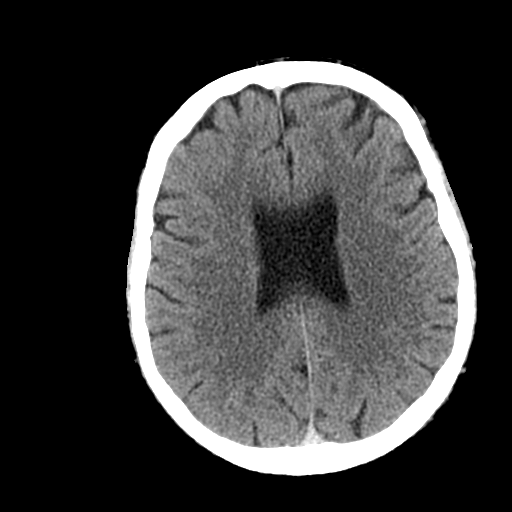
[im 22/32  brain]
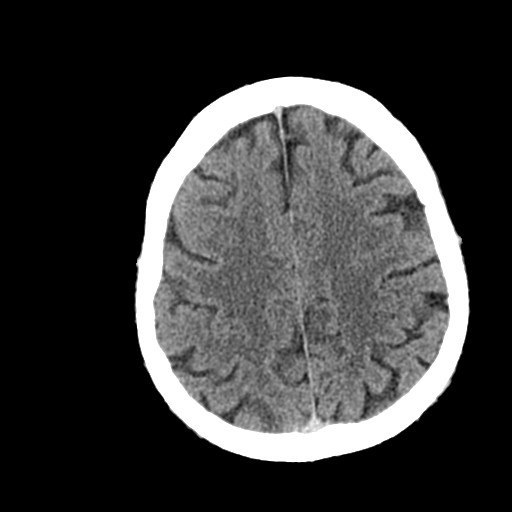
[im 25/32  brain]
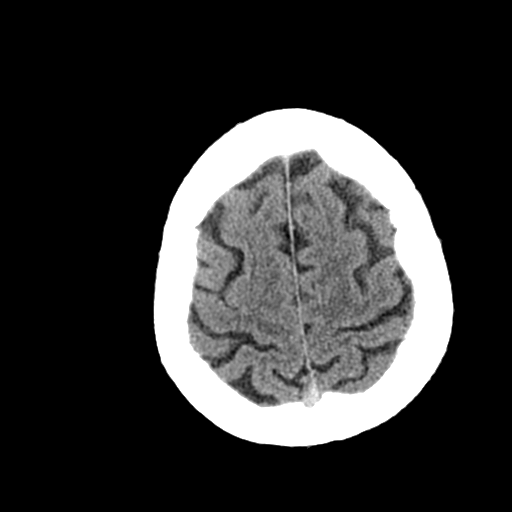
[im 28/32  brain]
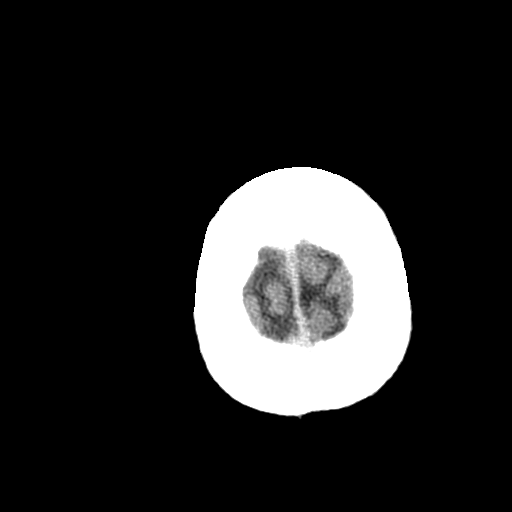
[im 28/32  bone]
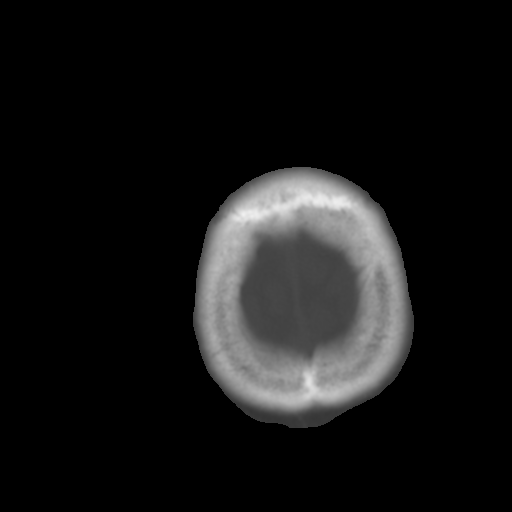

[Series 3: bone windows · axial · 0.41mm/px · z∈[+1225,+1345]mm · 8 of 52 slices shown]
[im 6/52  bone]
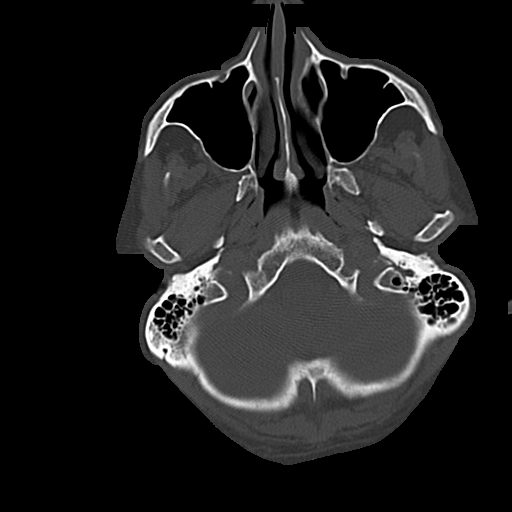
[im 12/52  bone]
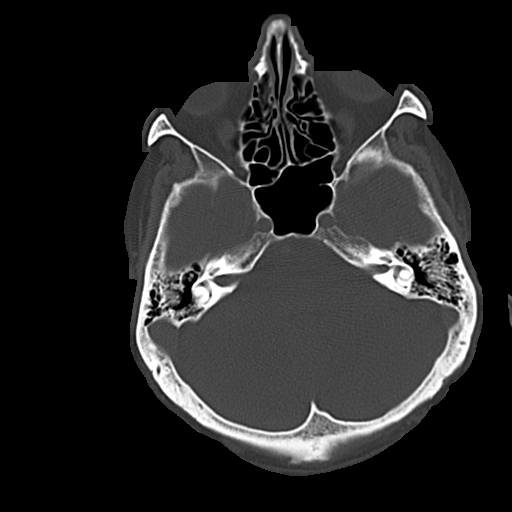
[im 18/52  bone]
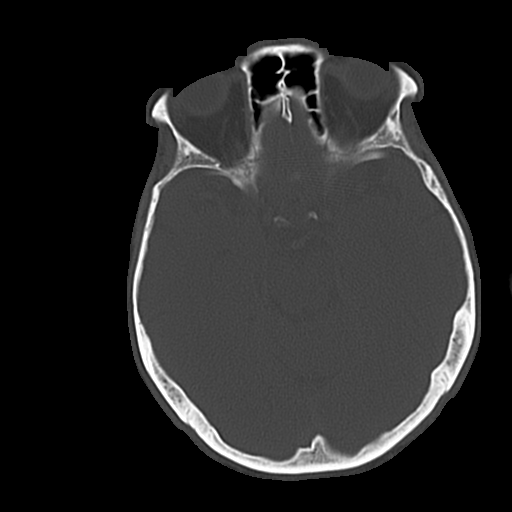
[im 23/52  bone]
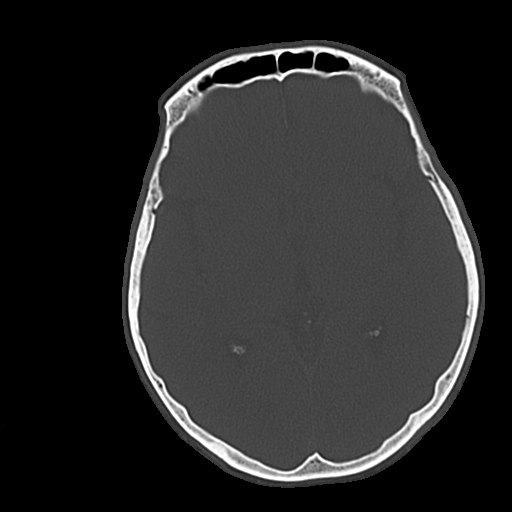
[im 29/52  bone]
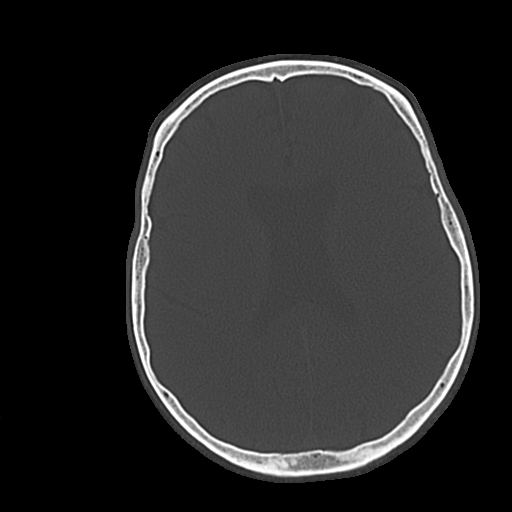
[im 35/52  bone]
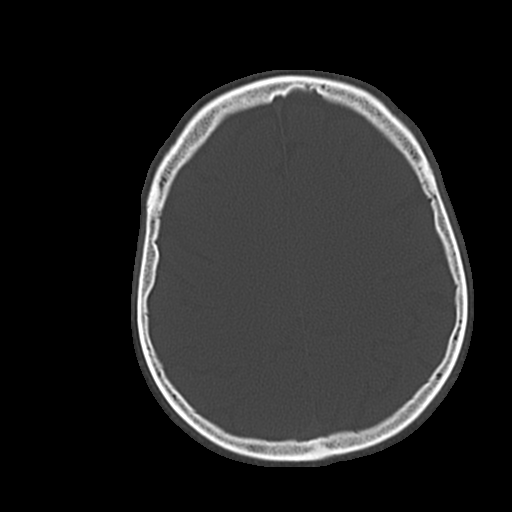
[im 40/52  bone]
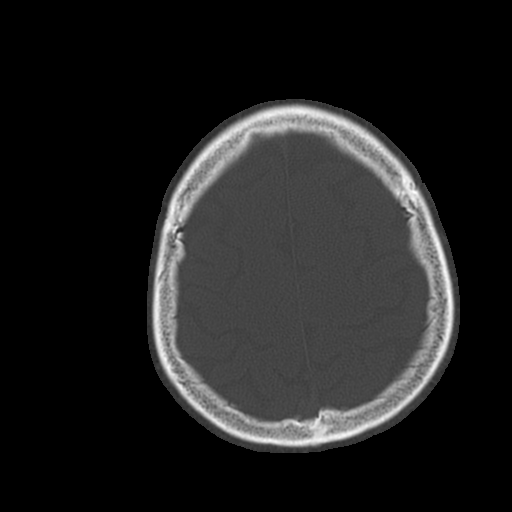
[im 46/52  bone]
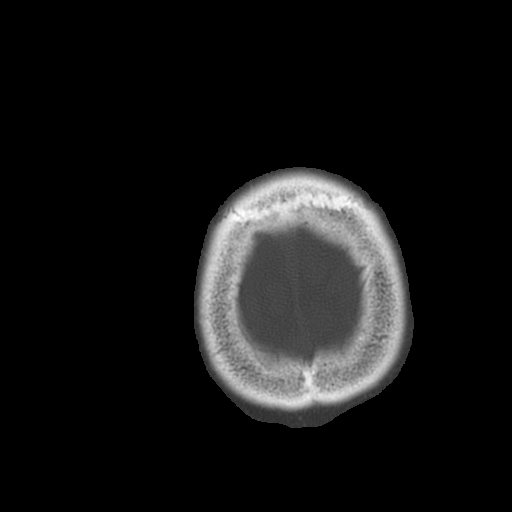

[17 of 30 positions shown; findings below may reference images not displayed]

FINDINGS: The brainstem, cerebellum, cerebral peduncles, thalamus, basal
ganglia, basilar cisterns, and ventricular system appear within
normal limits. No intracranial hemorrhage, mass lesion, or acute
CVA.

Poor visualization of the septum pellucidum. I suspect that there is
a cavum septi pellucidi which is thin and poorly seen due to
low-dose scan. I do not demonstrate any other findings to suggest
septo-optic dysplasia.
IMPRESSION: 1.  No significant abnormality identified.

## 2017-05-08 DIAGNOSIS — Z79891 Long term (current) use of opiate analgesic: Secondary | ICD-10-CM | POA: Diagnosis not present

## 2017-05-08 DIAGNOSIS — M545 Low back pain: Secondary | ICD-10-CM | POA: Diagnosis not present

## 2017-05-08 DIAGNOSIS — G894 Chronic pain syndrome: Secondary | ICD-10-CM | POA: Diagnosis not present

## 2017-05-08 DIAGNOSIS — M25569 Pain in unspecified knee: Secondary | ICD-10-CM | POA: Diagnosis not present

## 2017-06-05 DIAGNOSIS — M25569 Pain in unspecified knee: Secondary | ICD-10-CM | POA: Diagnosis not present

## 2017-06-05 DIAGNOSIS — Z79891 Long term (current) use of opiate analgesic: Secondary | ICD-10-CM | POA: Diagnosis not present

## 2017-06-05 DIAGNOSIS — G894 Chronic pain syndrome: Secondary | ICD-10-CM | POA: Diagnosis not present

## 2017-06-05 DIAGNOSIS — M545 Low back pain: Secondary | ICD-10-CM | POA: Diagnosis not present

## 2017-07-05 DIAGNOSIS — M25569 Pain in unspecified knee: Secondary | ICD-10-CM | POA: Diagnosis not present

## 2017-07-05 DIAGNOSIS — Z79891 Long term (current) use of opiate analgesic: Secondary | ICD-10-CM | POA: Diagnosis not present

## 2017-07-05 DIAGNOSIS — M545 Low back pain: Secondary | ICD-10-CM | POA: Diagnosis not present

## 2017-07-05 DIAGNOSIS — G894 Chronic pain syndrome: Secondary | ICD-10-CM | POA: Diagnosis not present

## 2017-08-02 DIAGNOSIS — M545 Low back pain: Secondary | ICD-10-CM | POA: Diagnosis not present

## 2017-08-02 DIAGNOSIS — G894 Chronic pain syndrome: Secondary | ICD-10-CM | POA: Diagnosis not present

## 2017-08-02 DIAGNOSIS — Z79891 Long term (current) use of opiate analgesic: Secondary | ICD-10-CM | POA: Diagnosis not present

## 2017-08-02 DIAGNOSIS — M25569 Pain in unspecified knee: Secondary | ICD-10-CM | POA: Diagnosis not present

## 2017-09-24 ENCOUNTER — Encounter (HOSPITAL_COMMUNITY): Payer: Self-pay | Admitting: Emergency Medicine

## 2017-09-24 ENCOUNTER — Emergency Department (HOSPITAL_COMMUNITY)
Admission: EM | Admit: 2017-09-24 | Discharge: 2017-09-24 | Disposition: A | Discharge status: Home / Self Care | Payer: Medicare HMO | Attending: Emergency Medicine | Admitting: Emergency Medicine

## 2017-09-24 ENCOUNTER — Emergency Department (HOSPITAL_COMMUNITY): Payer: Medicare HMO

## 2017-09-24 DIAGNOSIS — R404 Transient alteration of awareness: Secondary | ICD-10-CM | POA: Diagnosis not present

## 2017-09-24 DIAGNOSIS — R569 Unspecified convulsions: Secondary | ICD-10-CM | POA: Diagnosis not present

## 2017-09-24 DIAGNOSIS — Z79899 Other long term (current) drug therapy: Secondary | ICD-10-CM | POA: Insufficient documentation

## 2017-09-24 DIAGNOSIS — W19XXXA Unspecified fall, initial encounter: Secondary | ICD-10-CM | POA: Diagnosis not present

## 2017-09-24 DIAGNOSIS — S00512A Abrasion of oral cavity, initial encounter: Secondary | ICD-10-CM | POA: Diagnosis not present

## 2017-09-24 DIAGNOSIS — R410 Disorientation, unspecified: Secondary | ICD-10-CM | POA: Diagnosis not present

## 2017-09-24 LAB — BASIC METABOLIC PANEL
Anion gap: 12 (ref 5–15)
BUN: 12 mg/dL (ref 6–20)
CHLORIDE: 105 mmol/L (ref 98–111)
CO2: 20 mmol/L — AB (ref 22–32)
Calcium: 9.2 mg/dL (ref 8.9–10.3)
Creatinine, Ser: 1.04 mg/dL (ref 0.61–1.24)
GFR calc Af Amer: >60 mL/min (ref >60)
GFR calc non Af Amer: >60 mL/min (ref >60)
GLUCOSE: 159 mg/dL — AB (ref 70–99)
POTASSIUM: 3.3 mmol/L — AB (ref 3.5–5.1)
Sodium: 137 mmol/L (ref 135–145)

## 2017-09-24 LAB — CBC
HEMATOCRIT: 39.3 % (ref 39.0–52.0)
Hemoglobin: 13.3 g/dL (ref 13.0–17.0)
MCH: 28.7 pg (ref 26.0–34.0)
MCHC: 33.8 g/dL (ref 30.0–36.0)
MCV: 84.7 fL (ref 78.0–100.0)
Platelets: 213 10*3/uL (ref 150–400)
RBC: 4.64 MIL/uL (ref 4.22–5.81)
RDW: 14.6 % (ref 11.5–15.5)
WBC: 13.6 10*3/uL — ABNORMAL HIGH (ref 4.0–10.5)

## 2017-09-24 LAB — CBG MONITORING, ED: Glucose-Capillary: 154 mg/dL — ABNORMAL HIGH (ref 70–99)

## 2017-09-24 LAB — MAGNESIUM: MAGNESIUM: 2 mg/dL (ref 1.7–2.4)

## 2017-09-24 MED ORDER — SODIUM CHLORIDE 0.9 % IV BOLUS
1000.0000 mL | Freq: Once | INTRAVENOUS | Status: AC
Start: 2017-09-24 — End: 2017-09-24
  Administered 2017-09-24: 1000 mL via INTRAVENOUS

## 2017-09-24 NOTE — ED Notes (Signed)
Pt was able to ambulate in hallway without assistance.

## 2017-09-24 NOTE — ED Provider Notes (Signed)
[facility]   CSN: [id number] Arrival date & time: 09/24/17  1853  History   Chief Complaint Chief Complaint  Patient presents with  . Seizures   HPI Patient is a 62 year old male with history of chronic back pain, C. difficile colitis, and depression presenting to the ED for possible seizure. Details of possible seizure are unknown as patient is amnestic to event and there are no family members present. Per RN report from EMS, he was witnessed to have seizure activity and truck and postictal confusion. Patient states the last thing he remembers is working on a truck today and then awakening with sore tongue. He denies any headache, chest pain, weakness, numbness, vomiting, vision changes, or recent head injury. No prior seizures. No other recent illness or injury.  Past Medical History:  Diagnosis Date  . Anginal pain (HCC) ~ 2007  . Arthritis    "might be getting some" (02/28/2012)  . C. difficile diarrhea ~ 2008; 02/28/2012  . Chronic lower back pain   . Complication of anesthesia    "my ears rang before I went out" (02/28/2012)  . GERD (gastroesophageal reflux disease)   . Stress at home    "from my daughter mostly" (02/28/2012)    Patient Active Problem List   Diagnosis Date Noted  . Benign prostatic hyperplasia 08/22/2014  . Abscess and cellulitis 08/14/2014  . Cellulitis of right lower extremity 08/14/2014  . Recurrent major depression-severe (HCC) 03/29/2014  . Major depressive disorder, recurrent, severe with psychotic features (HCC) 03/28/2014  . Opiate dependence (HCC) 03/28/2014  . Benzodiazepine abuse, episodic (HCC) 03/28/2014  . Colitis due to Clostridium difficile 02/29/2012    Class: Acute  . Hypokalemia 02/29/2012    Class: Acute  . Low back pain 02/29/2012    Class: Chronic  . Acute infective gastroenteritis 02/28/2012  . Dehydration 02/28/2012  . Tachycardia 02/28/2012  . Heme positive stool  02/28/2012    Past Surgical History:  Procedure Laterality Date  . CARDIAC CATHETERIZATION  ~ 2007  . MULTIPLE TOOTH EXTRACTIONS  1990's   "3" (02/28/2012)        Home Medications    Prior to Admission medications   Medication Sig Start Date End Date Taking? Authorizing Provider  alprazolam Prudy Feeler(XANAX) 2 MG tablet Take 2 mg by mouth 3 (three) times daily as needed for sleep.   Yes [provider]  Buprenorphine HCl-Naloxone HCl (SUBOXONE) 8-2 MG FILM Place 1 Film under the tongue at bedtime.   Yes [provider]  DULoxetine (CYMBALTA) 30 MG capsule Take 30 mg by mouth at bedtime.   Yes [provider]  HYDROcodone-acetaminophen (NORCO/VICODIN) 5-325 MG per tablet Take 1-2 tablets by mouth every 4 (four) hours as needed for moderate pain or severe pain (pain). 08/17/14  Yes Joseph ArtVann, Jessica U, DO  Melatonin 3 MG TABS Take 3-9 mg by mouth at bedtime as needed (SLEEP).   Yes [provider]  vitamin B-12 (CYANOCOBALAMIN) 1000 MCG tablet Take 1,000 mcg by mouth daily.   Yes [provider]  clindamycin (CLEOCIN) 300 MG capsule Take 1 capsule (300 mg total) by mouth 3 (three) times daily. Patient not taking: Reported on 09/24/2017 11/19/14   Linna HoffKindl, James D, MD  diclofenac (CATAFLAM) 50 MG tablet Take 1 tablet (50 mg total) by mouth 3 (three) times daily. Patient not taking: Reported on 09/24/2017 11/19/14   Linna HoffKindl, James D, MD  doxycycline (VIBRA-TABS) 100 MG tablet Take 1 tablet (100 mg total)  by mouth every 12 (twelve) hours. Patient not taking: Reported on 09/24/2017 08/17/14   Joseph Art, DO  Multiple Vitamin (MULTIVITAMIN WITH MINERALS) TABS tablet Take 1 tablet by mouth daily. For low vitamin Patient not taking: Reported on 09/24/2017 04/05/14   Armandina Stammer I, NP  sertraline (ZOLOFT) 25 MG tablet Take 5 tablets (125 mg total) by mouth daily. For depression Patient not taking: Reported on 09/24/2017 08/22/14   Hoy Register, MD  tamsulosin (FLOMAX) 0.4 MG  CAPS capsule Take 1 capsule (0.4 mg total) by mouth daily. Patient not taking: Reported on 09/24/2017 08/22/14   Hoy Register, MD    Family History Family History  Problem Relation Age of Onset  . Alzheimer's disease Mother   . Diabetes Mellitus II Neg Hx     Social History Social History   Tobacco Use  . Smoking status: Never Smoker  . Smokeless tobacco: Never Used  Substance Use Topics  . Alcohol use: No    Comment: 02/28/2012 "5-6 beers on the weekends" 08/22/14 none.  . Drug use: Yes    Types: Marijuana, LSD, Benzodiazepines, Cocaine    Comment: 02/28/2012 "back in the 1970's"      Allergies   Patient has no known allergies.   Review of Systems Review of Systems  Constitutional: Negative for fever.  HENT: Negative for congestion.   Eyes: Negative for visual disturbance.  Respiratory: Negative for cough and shortness of breath.   Cardiovascular: Negative for chest pain.  Gastrointestinal: Negative for abdominal pain, diarrhea and vomiting.  Genitourinary: Negative for dysuria.  Musculoskeletal: Negative for back pain.  Skin: Negative for rash.  Neurological: Positive for seizures (possible). Negative for headaches.  All other systems reviewed and are negative.    Physical Exam Updated Vital Signs BP (!) 147/77   Pulse 78   Temp 98 F (36.7 C) (Oral)   Resp 18   SpO2 96%   Physical Exam  Constitutional: He is oriented to person, place, and time. No distress.  HENT:  Head: Normocephalic and atraumatic.  Mouth/Throat: Oropharynx is clear and moist.  Abrasion to the tip of the tongue, otherwise atraumatic  Eyes: Pupils are equal, round, and reactive to light. EOM are normal.  Neck: Neck supple. No JVD present.  Cardiovascular: Normal rate, regular rhythm, normal heart sounds and intact distal pulses.  No murmur heard. Pulmonary/Chest: Breath sounds normal. No respiratory distress. He has no wheezes. He has no rales.  Abdominal: Soft. He exhibits no  distension and no mass. There is no tenderness. There is no guarding.  Musculoskeletal: Normal range of motion. He exhibits no edema.  Neurological: He is alert and oriented to person, place, and time.  Speech is fluent. CN II-XII intact. 5/5 strength in all extremities with sensation intact to light touch throughout. No dysmetria on finger-to-nose. Negative Romberg. Normal gait.  Skin: Skin is warm and dry.  Psychiatric: He has a normal mood and affect. His behavior is normal.  Nursing note and vitals reviewed.    ED Treatments / Results  Labs (all labs ordered are listed, but only abnormal results are displayed) Labs Reviewed  BASIC METABOLIC PANEL - Abnormal; Notable for the following components:      Result Value   Potassium 3.3 (*)    CO2 20 (*)    Glucose, Bld 159 (*)    All other components within normal limits  CBC - Abnormal; Notable for the following components:   WBC 13.6 (*)    All other components  within normal limits  CBG MONITORING, ED - Abnormal; Notable for the following components:   Glucose-Capillary 154 (*)    All other components within normal limits  MAGNESIUM    EKG EKG Interpretation  Date/Time:  Sunday September 24 2017 19:17:29 EDT Ventricular Rate:  85 PR Interval:    QRS Duration: 97 QT Interval:  365 QTC Calculation: 434 R Axis:   -8 Text Interpretation:  Atrial fibrillation Abnormal R-wave progression, early transition Borderline T abnormalities, inferior leads Confirmed by Lorre Nick ([id number]) on 09/24/2017 9:02:48 PM   Radiology Ct Head Wo Contrast  Result Date: 09/24/2017 CLINICAL DATA:  Seizure, new, nontraumatic EXAM: CT HEAD WITHOUT CONTRAST TECHNIQUE: Contiguous axial images were obtained from the base of the skull through the vertex without intravenous contrast. COMPARISON:  03/28/2014 FINDINGS: Brain: No evidence of infarction, hemorrhage, hydrocephalus, extra-axial collection or mass lesion/mass effect. Absent septum pellucidum. Vascular:  No hyperdense vessel or unexpected calcification. Skull: Normal. Negative for fracture or focal lesion. Sinuses/Orbits: Negative IMPRESSION: Negative head CT. Electronically Signed   By: Marnee Spring M.D.   On: 09/24/2017 20:53    Procedures Procedures (including critical care time)  Medications Ordered in ED Medications  sodium chloride 0.9 % bolus 1,000 mL (0 mLs Intravenous Stopped 09/24/17 2109)     Initial Impression / Assessment and Plan / ED Course  I have reviewed the triage vital signs and the nursing notes.  Pertinent labs & imaging results that were available during my care of the patient were reviewed by me and considered in my medical decision making (see chart for details).  Patient presenting for seizure-like activity. Additional history obtained from family member later in visit. Patient was sitting in truck when he had abrupt-onset of bilateral arm rhythmic shaking with leg and head stiffness which lasted for up to 8 minutes. He had some blood in his mouth. No incontinence. No head injury.  Description concerning for seizure. Screening labs unremarkable. CT head is negative. No daily alcohol use or other signs of withdrawal syndrome. Unclear etiology of possible seizure. His neurologic exam is normal and he remained at baseline while in the ED. Ambulating without difficulty. No chest pain or other cardiac symptoms before or after the event to suggest ACS or arrhythmia. EKG unchanged from prior. Will discharge home with neurology follow up.  Patient informed of all ED findings. Return precautions and follow up plan reviewed. All questions answered.   Final Clinical Impressions(s) / ED Diagnoses   Final diagnoses:  Seizure-like activity [facility])     Cecille Po, MD 09/25/17 1244    Lorre Nick, MD 09/26/17 1341

## 2017-09-24 NOTE — ED Provider Notes (Signed)
I saw and evaluated the patient, reviewed the resident's note and I agree with the findings and plan.  EKG: EKG Interpretation  Date/Time:  Sunday September 24 2017 19:17:29 EDT Ventricular Rate:  85 PR Interval:    QRS Duration: 97 QT Interval:  365 QTC Calculation: 434 R Axis:   -8 Text Interpretation:  Atrial fibrillation Abnormal R-wave progression, early transition Borderline T abnormalities, inferior leads Confirmed by Lorre NickAllen, Issacc Merlo ([id number]) on 09/24/2017 [id number] PM 62 year old male presents here from home after having a witnessed seizure by family.  Patient was postictal afterwards.  Denies any prior history of seizure disorder.  Neurological exam is stable here.  Head CT negative.  Patient states he has not slept in 3 days.  Will give neurological referral   Lorre NickAllen, Flecia Shutter, MD 09/24/17 2220

## 2017-09-24 NOTE — ED Notes (Signed)
Called Lab and added on Magnesium

## 2017-09-24 NOTE — ED Triage Notes (Signed)
Pt from home  . Pt had a witnessed  Seizure by family. Family reported Pt was sitting in his truck alone. Family reported they saw pt have a seizure. Pt then was able to get out of truck and walk around. Family reported Pt then got back into the truck. EMS reported Pt was post ictal and not talking during transport. . On arrival to ED Pt asked if he vomited on any one. Pt moved from stretcher to stretcher with out assistance.

## 2017-09-24 NOTE — ED Notes (Signed)
Pt undertstood dc material. NAD noted

## 2017-09-26 ENCOUNTER — Emergency Department (HOSPITAL_COMMUNITY)
Admission: EM | Admit: 2017-09-26 | Discharge: 2017-09-27 | Disposition: A | Discharge status: Other Acute Inpatient Hospital | Payer: Medicare HMO | Attending: Emergency Medicine | Admitting: Emergency Medicine

## 2017-09-26 ENCOUNTER — Encounter (HOSPITAL_COMMUNITY): Payer: Self-pay

## 2017-09-26 DIAGNOSIS — R569 Unspecified convulsions: Secondary | ICD-10-CM | POA: Diagnosis not present

## 2017-09-26 DIAGNOSIS — G4751 Confusional arousals: Secondary | ICD-10-CM | POA: Insufficient documentation

## 2017-09-26 DIAGNOSIS — R410 Disorientation, unspecified: Secondary | ICD-10-CM | POA: Diagnosis not present

## 2017-09-26 DIAGNOSIS — F1310 Sedative, hypnotic or anxiolytic abuse, uncomplicated: Secondary | ICD-10-CM | POA: Diagnosis present

## 2017-09-26 DIAGNOSIS — F1120 Opioid dependence, uncomplicated: Secondary | ICD-10-CM | POA: Insufficient documentation

## 2017-09-26 DIAGNOSIS — R4182 Altered mental status, unspecified: Secondary | ICD-10-CM | POA: Diagnosis present

## 2017-09-26 DIAGNOSIS — N390 Urinary tract infection, site not specified: Secondary | ICD-10-CM | POA: Diagnosis not present

## 2017-09-26 DIAGNOSIS — F333 Major depressive disorder, recurrent, severe with psychotic symptoms: Secondary | ICD-10-CM

## 2017-09-26 DIAGNOSIS — Z79899 Other long term (current) drug therapy: Secondary | ICD-10-CM | POA: Diagnosis not present

## 2017-09-26 DIAGNOSIS — K219 Gastro-esophageal reflux disease without esophagitis: Secondary | ICD-10-CM | POA: Diagnosis not present

## 2017-09-26 DIAGNOSIS — M1990 Unspecified osteoarthritis, unspecified site: Secondary | ICD-10-CM | POA: Diagnosis not present

## 2017-09-26 DIAGNOSIS — R0902 Hypoxemia: Secondary | ICD-10-CM | POA: Diagnosis not present

## 2017-09-26 DIAGNOSIS — G8929 Other chronic pain: Secondary | ICD-10-CM | POA: Diagnosis not present

## 2017-09-26 DIAGNOSIS — M545 Low back pain: Secondary | ICD-10-CM | POA: Diagnosis not present

## 2017-09-26 DIAGNOSIS — F23 Brief psychotic disorder: Secondary | ICD-10-CM | POA: Diagnosis not present

## 2017-09-26 DIAGNOSIS — N3000 Acute cystitis without hematuria: Secondary | ICD-10-CM | POA: Diagnosis not present

## 2017-09-26 DIAGNOSIS — I10 Essential (primary) hypertension: Secondary | ICD-10-CM | POA: Diagnosis not present

## 2017-09-26 DIAGNOSIS — R69 Illness, unspecified: Secondary | ICD-10-CM | POA: Diagnosis not present

## 2017-09-26 DIAGNOSIS — F05 Delirium due to known physiological condition: Secondary | ICD-10-CM | POA: Diagnosis not present

## 2017-09-26 MED ORDER — SODIUM CHLORIDE 0.9 % IV SOLN: INTRAVENOUS | Status: DC

## 2017-09-26 MED ORDER — SODIUM CHLORIDE 0.9 % IV BOLUS
1000.0000 mL | Freq: Once | INTRAVENOUS | Status: AC
  Administered 2017-09-27: 1000 mL via INTRAVENOUS

## 2017-09-26 NOTE — ED Triage Notes (Signed)
Pt arrived from home due to increased alerted mental status that stared Sunday. Pt seen Sunday due to a seizure, seen at [facility]. Oriented x3 not oriented to time. Pt was prescribed suboxone family states they think he stopped taking it and has been taking other meds that are not his.

## 2017-09-26 NOTE — ED Notes (Signed)
Bed: RESB Expected date:  Expected time:  Means of arrival:  Comments: EMS new onset seizure/hallucinations

## 2017-09-27 ENCOUNTER — Emergency Department (HOSPITAL_COMMUNITY): Payer: Medicare HMO

## 2017-09-27 ENCOUNTER — Encounter (HOSPITAL_COMMUNITY): Payer: Self-pay | Admitting: Obstetrics and Gynecology

## 2017-09-27 ENCOUNTER — Encounter (HOSPITAL_COMMUNITY): Payer: Self-pay | Admitting: *Deleted

## 2017-09-27 ENCOUNTER — Inpatient Hospital Stay (HOSPITAL_COMMUNITY)
Admission: AD | Admit: 2017-09-27 | Discharge: 2017-10-06 | DRG: 885 | Disposition: A | Discharge status: Home / Self Care | Payer: Medicare HMO | Source: Intra-hospital | Attending: Emergency Medicine | Admitting: Emergency Medicine

## 2017-09-27 ENCOUNTER — Other Ambulatory Visit: Payer: Self-pay

## 2017-09-27 DIAGNOSIS — G4700 Insomnia, unspecified: Secondary | ICD-10-CM | POA: Diagnosis not present

## 2017-09-27 DIAGNOSIS — F419 Anxiety disorder, unspecified: Secondary | ICD-10-CM | POA: Diagnosis not present

## 2017-09-27 DIAGNOSIS — N400 Enlarged prostate without lower urinary tract symptoms: Secondary | ICD-10-CM | POA: Diagnosis present

## 2017-09-27 DIAGNOSIS — G479 Sleep disorder, unspecified: Secondary | ICD-10-CM | POA: Diagnosis present

## 2017-09-27 DIAGNOSIS — Z810 Family history of intellectual disabilities: Secondary | ICD-10-CM | POA: Diagnosis not present

## 2017-09-27 DIAGNOSIS — F1120 Opioid dependence, uncomplicated: Secondary | ICD-10-CM | POA: Diagnosis present

## 2017-09-27 DIAGNOSIS — R410 Disorientation, unspecified: Secondary | ICD-10-CM

## 2017-09-27 DIAGNOSIS — F333 Major depressive disorder, recurrent, severe with psychotic symptoms: Secondary | ICD-10-CM | POA: Diagnosis present

## 2017-09-27 DIAGNOSIS — M545 Low back pain: Secondary | ICD-10-CM | POA: Diagnosis not present

## 2017-09-27 DIAGNOSIS — G8929 Other chronic pain: Secondary | ICD-10-CM | POA: Diagnosis present

## 2017-09-27 DIAGNOSIS — M1990 Unspecified osteoarthritis, unspecified site: Secondary | ICD-10-CM | POA: Diagnosis not present

## 2017-09-27 DIAGNOSIS — Z79899 Other long term (current) drug therapy: Secondary | ICD-10-CM | POA: Diagnosis not present

## 2017-09-27 DIAGNOSIS — K219 Gastro-esophageal reflux disease without esophagitis: Secondary | ICD-10-CM | POA: Diagnosis present

## 2017-09-27 DIAGNOSIS — R4020 Unspecified coma: Secondary | ICD-10-CM | POA: Diagnosis not present

## 2017-09-27 DIAGNOSIS — F23 Brief psychotic disorder: Secondary | ICD-10-CM | POA: Diagnosis present

## 2017-09-27 DIAGNOSIS — R4182 Altered mental status, unspecified: Secondary | ICD-10-CM | POA: Diagnosis not present

## 2017-09-27 DIAGNOSIS — R569 Unspecified convulsions: Secondary | ICD-10-CM | POA: Diagnosis present

## 2017-09-27 DIAGNOSIS — F05 Delirium due to known physiological condition: Secondary | ICD-10-CM | POA: Diagnosis not present

## 2017-09-27 DIAGNOSIS — F13239 Sedative, hypnotic or anxiolytic dependence with withdrawal, unspecified: Secondary | ICD-10-CM | POA: Diagnosis present

## 2017-09-27 DIAGNOSIS — N390 Urinary tract infection, site not specified: Secondary | ICD-10-CM | POA: Diagnosis not present

## 2017-09-27 DIAGNOSIS — F1310 Sedative, hypnotic or anxiolytic abuse, uncomplicated: Secondary | ICD-10-CM | POA: Diagnosis not present

## 2017-09-27 DIAGNOSIS — R69 Illness, unspecified: Secondary | ICD-10-CM | POA: Diagnosis not present

## 2017-09-27 DIAGNOSIS — G4751 Confusional arousals: Secondary | ICD-10-CM | POA: Diagnosis not present

## 2017-09-27 DIAGNOSIS — I10 Essential (primary) hypertension: Secondary | ICD-10-CM | POA: Diagnosis not present

## 2017-09-27 LAB — CBC WITH DIFFERENTIAL/PLATELET
BASOS ABS: 0 10*3/uL (ref 0.0–0.1)
BASOS PCT: 0 %
EOS ABS: 0 10*3/uL (ref 0.0–0.7)
Eosinophils Relative: 0 %
HCT: 39.3 % (ref 39.0–52.0)
Hemoglobin: 13.5 g/dL (ref 13.0–17.0)
LYMPHS ABS: 2.2 10*3/uL (ref 0.7–4.0)
Lymphocytes Relative: 20 %
MCH: 29.2 pg (ref 26.0–34.0)
MCHC: 34.4 g/dL (ref 30.0–36.0)
MCV: 85.1 fL (ref 78.0–100.0)
Monocytes Absolute: 0.7 10*3/uL (ref 0.1–1.0)
Monocytes Relative: 7 %
NEUTROS PCT: 73 %
Neutro Abs: 7.8 10*3/uL — ABNORMAL HIGH (ref 1.7–7.7)
PLATELETS: 222 10*3/uL (ref 150–400)
RBC: 4.62 MIL/uL (ref 4.22–5.81)
RDW: 15.3 % (ref 11.5–15.5)
WBC: 10.7 10*3/uL — AB (ref 4.0–10.5)

## 2017-09-27 LAB — RAPID URINE DRUG SCREEN, HOSP PERFORMED
Amphetamines: NOT DETECTED
BENZODIAZEPINES: NOT DETECTED
Cocaine: NOT DETECTED
Opiates: NOT DETECTED
Tetrahydrocannabinol: NOT DETECTED

## 2017-09-27 LAB — URINALYSIS, ROUTINE W REFLEX MICROSCOPIC
Bilirubin Urine: NEGATIVE
GLUCOSE, UA: NEGATIVE mg/dL
Ketones, ur: 5 mg/dL — AB
NITRITE: NEGATIVE
PROTEIN: NEGATIVE mg/dL
Specific Gravity, Urine: 1.011 (ref 1.005–1.030)
pH: 7 (ref 5.0–8.0)

## 2017-09-27 LAB — COMPREHENSIVE METABOLIC PANEL
ALK PHOS: 66 U/L (ref 38–126)
ALT: 24 U/L (ref 0–44)
ANION GAP: 10 (ref 5–15)
AST: 29 U/L (ref 15–41)
Albumin: 4 g/dL (ref 3.5–5.0)
BILIRUBIN TOTAL: 0.7 mg/dL (ref 0.3–1.2)
BUN: 16 mg/dL (ref 6–20)
CALCIUM: 9.2 mg/dL (ref 8.9–10.3)
CO2: 27 mmol/L (ref 22–32)
CREATININE: 0.84 mg/dL (ref 0.61–1.24)
Chloride: 108 mmol/L (ref 98–111)
GFR calc Af Amer: >60 mL/min (ref >60)
GFR calc non Af Amer: >60 mL/min (ref >60)
GLUCOSE: 109 mg/dL — AB (ref 70–99)
Potassium: 3 mmol/L — ABNORMAL LOW (ref 3.5–5.1)
Sodium: 145 mmol/L (ref 135–145)
TOTAL PROTEIN: 7.7 g/dL (ref 6.5–8.1)

## 2017-09-27 LAB — AMMONIA: Ammonia: 19 umol/L (ref 9–35)

## 2017-09-27 LAB — ETHANOL: Alcohol, Ethyl (B): <10 mg/dL (ref <10)

## 2017-09-27 LAB — ACETAMINOPHEN LEVEL

## 2017-09-27 LAB — SALICYLATE LEVEL: Salicylate Lvl: <7 mg/dL (ref 2.8–30.0)

## 2017-09-27 LAB — CK: Total CK: 602 U/L — ABNORMAL HIGH (ref 49–397)

## 2017-09-27 MED ORDER — HALOPERIDOL 5 MG PO TABS
5.0000 mg | ORAL_TABLET | Freq: Two times a day (BID) | ORAL | Status: DC
  Administered 2017-09-27: 5 mg via ORAL
  Filled 2017-09-27 (×2): qty 1

## 2017-09-27 MED ORDER — POTASSIUM CHLORIDE CRYS ER 20 MEQ PO TBCR
40.0000 meq | EXTENDED_RELEASE_TABLET | Freq: Once | ORAL | Status: AC
  Administered 2017-09-27: 40 meq via ORAL
  Filled 2017-09-27: qty 2

## 2017-09-27 MED ORDER — HALOPERIDOL 5 MG PO TABS
5.0000 mg | ORAL_TABLET | Freq: Two times a day (BID) | ORAL | Status: DC
  Administered 2017-09-27 – 2017-09-28 (×2): 5 mg via ORAL
  Filled 2017-09-27 (×7): qty 1

## 2017-09-27 MED ORDER — VITAMIN B-1 100 MG PO TABS
100.0000 mg | ORAL_TABLET | Freq: Every day | ORAL | Status: DC
  Administered 2017-09-28 – 2017-10-06 (×9): 100 mg via ORAL
  Filled 2017-09-27 (×13): qty 1

## 2017-09-27 MED ORDER — CEPHALEXIN 500 MG PO CAPS
500.0000 mg | ORAL_CAPSULE | Freq: Three times a day (TID) | ORAL | Status: DC
  Administered 2017-09-27 – 2017-10-06 (×27): 500 mg via ORAL
  Filled 2017-09-27 (×9): qty 1
  Filled 2017-09-27: qty 2
  Filled 2017-09-27 (×16): qty 1
  Filled 2017-09-27: qty 2
  Filled 2017-09-27 (×8): qty 1

## 2017-09-27 MED ORDER — VITAMIN B-1 100 MG PO TABS
100.0000 mg | ORAL_TABLET | Freq: Every day | ORAL | Status: DC
  Filled 2017-09-27: qty 1

## 2017-09-27 MED ORDER — LORAZEPAM 1 MG PO TABS
1.0000 mg | ORAL_TABLET | Freq: Four times a day (QID) | ORAL | Status: DC
  Administered 2017-09-27 – 2017-09-28 (×2): 1 mg via ORAL
  Filled 2017-09-27 (×2): qty 1

## 2017-09-27 MED ORDER — LISINOPRIL 10 MG PO TABS
10.0000 mg | ORAL_TABLET | Freq: Every day | ORAL | Status: DC
  Administered 2017-09-27: 10 mg via ORAL
  Filled 2017-09-27: qty 1

## 2017-09-27 MED ORDER — HYDROXYZINE HCL 25 MG PO TABS
25.0000 mg | ORAL_TABLET | Freq: Four times a day (QID) | ORAL | Status: AC | PRN
  Administered 2017-09-30: 25 mg via ORAL
  Filled 2017-09-27: qty 1

## 2017-09-27 MED ORDER — CEPHALEXIN 500 MG PO CAPS
500.0000 mg | ORAL_CAPSULE | Freq: Three times a day (TID) | ORAL | Status: DC
  Administered 2017-09-27 (×2): 500 mg via ORAL
  Filled 2017-09-27 (×2): qty 1

## 2017-09-27 MED ORDER — LORAZEPAM 1 MG PO TABS
1.0000 mg | ORAL_TABLET | Freq: Three times a day (TID) | ORAL | Status: DC
  Administered 2017-09-29: 1 mg via ORAL
  Filled 2017-09-27: qty 1

## 2017-09-27 MED ORDER — HYDROXYZINE HCL 25 MG PO TABS: 25.0000 mg | ORAL_TABLET | Freq: Four times a day (QID) | ORAL | Status: DC | PRN

## 2017-09-27 MED ORDER — LORAZEPAM 1 MG PO TABS: 1.0000 mg | ORAL_TABLET | Freq: Four times a day (QID) | ORAL | Status: DC | PRN

## 2017-09-27 MED ORDER — LORAZEPAM 1 MG PO TABS
1.0000 mg | ORAL_TABLET | Freq: Four times a day (QID) | ORAL | Status: DC | PRN
  Administered 2017-09-28 (×2): 1 mg via ORAL
  Filled 2017-09-27 (×2): qty 1

## 2017-09-27 MED ORDER — DULOXETINE HCL 30 MG PO CPEP: 30.0000 mg | ORAL_CAPSULE | Freq: Every day | ORAL | Status: DC

## 2017-09-27 MED ORDER — LORAZEPAM 1 MG PO TABS
1.0000 mg | ORAL_TABLET | Freq: Four times a day (QID) | ORAL | Status: DC
  Administered 2017-09-27: 1 mg via ORAL
  Filled 2017-09-27: qty 1

## 2017-09-27 MED ORDER — ONDANSETRON 4 MG PO TBDP: 4.0000 mg | ORAL_TABLET | Freq: Four times a day (QID) | ORAL | Status: AC | PRN

## 2017-09-27 MED ORDER — LORAZEPAM 1 MG PO TABS: 1.0000 mg | ORAL_TABLET | Freq: Every day | ORAL | Status: DC

## 2017-09-27 MED ORDER — DULOXETINE HCL 30 MG PO CPEP
30.0000 mg | ORAL_CAPSULE | Freq: Every day | ORAL | Status: DC
  Administered 2017-09-27 – 2017-10-05 (×9): 30 mg via ORAL
  Filled 2017-09-27 (×13): qty 1

## 2017-09-27 MED ORDER — THIAMINE HCL 100 MG/ML IJ SOLN
100.0000 mg | Freq: Once | INTRAMUSCULAR | Status: AC
  Administered 2017-09-27: 100 mg via INTRAMUSCULAR
  Filled 2017-09-27: qty 2

## 2017-09-27 MED ORDER — ACETAMINOPHEN 325 MG PO TABS
650.0000 mg | ORAL_TABLET | Freq: Four times a day (QID) | ORAL | Status: DC | PRN
  Administered 2017-09-28 – 2017-09-29 (×2): 650 mg via ORAL
  Filled 2017-09-27 (×2): qty 2

## 2017-09-27 MED ORDER — LORAZEPAM 1 MG PO TABS: 1.0000 mg | ORAL_TABLET | Freq: Three times a day (TID) | ORAL | Status: DC

## 2017-09-27 MED ORDER — MAGNESIUM HYDROXIDE 400 MG/5ML PO SUSP
30.0000 mL | Freq: Every day | ORAL | Status: DC | PRN
  Filled 2017-09-27: qty 30

## 2017-09-27 MED ORDER — ONDANSETRON 4 MG PO TBDP: 4.0000 mg | ORAL_TABLET | Freq: Four times a day (QID) | ORAL | Status: DC | PRN

## 2017-09-27 MED ORDER — LORAZEPAM 1 MG PO TABS: 1.0000 mg | ORAL_TABLET | Freq: Two times a day (BID) | ORAL | Status: DC

## 2017-09-27 MED ORDER — ALPRAZOLAM 0.5 MG PO TABS
1.0000 mg | ORAL_TABLET | Freq: Three times a day (TID) | ORAL | Status: DC | PRN
  Administered 2017-09-27: 1 mg via ORAL
  Filled 2017-09-27: qty 2

## 2017-09-27 MED ORDER — ALUM & MAG HYDROXIDE-SIMETH 200-200-20 MG/5ML PO SUSP: 30.0000 mL | ORAL | Status: DC | PRN

## 2017-09-27 MED ORDER — LOPERAMIDE HCL 2 MG PO CAPS: 2.0000 mg | ORAL_CAPSULE | ORAL | Status: AC | PRN

## 2017-09-27 MED ORDER — LISINOPRIL 10 MG PO TABS
10.0000 mg | ORAL_TABLET | Freq: Every day | ORAL | Status: DC
  Administered 2017-09-28 – 2017-10-06 (×9): 10 mg via ORAL
  Filled 2017-09-27 (×9): qty 1
  Filled 2017-09-27: qty 2
  Filled 2017-09-27 (×2): qty 1
  Filled 2017-09-27: qty 2

## 2017-09-27 MED ORDER — LOPERAMIDE HCL 2 MG PO CAPS: 2.0000 mg | ORAL_CAPSULE | ORAL | Status: DC | PRN

## 2017-09-27 MED ORDER — ADULT MULTIVITAMIN W/MINERALS CH
1.0000 | ORAL_TABLET | Freq: Every day | ORAL | Status: DC
  Administered 2017-09-28 – 2017-10-06 (×9): 1 via ORAL
  Filled 2017-09-27 (×13): qty 1

## 2017-09-27 MED ORDER — ADULT MULTIVITAMIN W/MINERALS CH
1.0000 | ORAL_TABLET | Freq: Every day | ORAL | Status: DC
  Filled 2017-09-27: qty 1

## 2017-09-27 NOTE — ED Notes (Signed)
Pts belongings placed in bags and labeled, placed in cabinets for resus B

## 2017-09-27 NOTE — ED Notes (Signed)
Pt was given the phone to talk to his wife. Pt still very anxious

## 2017-09-27 NOTE — ED Notes (Signed)
Pt attempting to use urinal, will not use it because he states "he has to go too bad" Pt clenching penis and will not let go to attempt to either use the restroom or get dressed.

## 2017-09-27 NOTE — BH Assessment (Addendum)
Assessment Note  Walter Holder is an 62 y.o. male.  The pt was brought in by his family due to altered mental status.  The pt had a seizure Sunday and has been confused since then.  According to the pt's wife the pt has been having hallucinations.  The pt is not a good historian.  The pt denies having any hallucinations.  He is currently living with his wife, son, and daughter.  He denies any problems at home.  The pt is able to state where he is and the president, but unable to give the year.  The pt states he sees a psychiatrist, but he is unable to remember the psychiatrist's name.  The pt denies current or past SI.  He also denies any self harm and HI.  He denies any legal issues.  He reports he sleeps and eats well.  He denies using any substances and his UDS is negative for all substances.  It is noted the pt has a UTI.  Pt is dressed in scrubs. He is alert . Pt speaks in a clear tone, at moderate volume and normal pace. Eye contact is good. Pt's mood is pleasant. Thought process is blocked.  When asked simple questions, such as the year, the pt gave long pauses and then would eventually answer, "I don't know". There is no indication Pt is currently responding to internal stimuli or experiencing delusional thought content.?Pt was cooperative throughout assessment.        Diagnosis: F23 Brief psychotic disorder   Past Medical History:  Past Medical History:  Diagnosis Date  . Anginal pain (HCC) ~ 2007  . Arthritis    "might be getting some" (02/28/2012)  . C. difficile diarrhea ~ 2008; 02/28/2012  . Chronic lower back pain   . Complication of anesthesia    "my ears rang before I went out" (02/28/2012)  . GERD (gastroesophageal reflux disease)   . Stress at home    "from my daughter mostly" (02/28/2012)    Past Surgical History:  Procedure Laterality Date  . CARDIAC CATHETERIZATION  ~ 2007  . MULTIPLE TOOTH EXTRACTIONS  1990's   "3" (02/28/2012)    Family History:  Family  History  Problem Relation Age of Onset  . Alzheimer's disease Mother   . Diabetes Mellitus II Neg Hx     Social History:  reports that he has never smoked. He has never used smokeless tobacco. He reports that he has current or past drug history. Drugs: Marijuana, LSD, Benzodiazepines, and Cocaine. He reports that he does not drink alcohol.  Additional Social History:  Alcohol / Drug Use Pain Medications: See MAR Prescriptions: See MAR Over the Counter: See MAR History of alcohol / drug use?: No history of alcohol / drug abuse Longest period of sobriety (when/how long): NA  CIWA: CIWA-Ar BP: 131/71 Pulse Rate: 78 COWS:    Allergies: No Known Allergies  Home Medications:  (Not in a hospital admission)  OB/GYN Status:  No LMP for male patient.  General Assessment Data Assessment unable to be completed: Yes Reason for not completing assessment: pt in bathroom for 20 minutes and not coming out. Location of Assessment: [facility] TTS Assessment: In system Is this a Tele or Face-to-Face Assessment?: Face-to-Face Is this an Initial Assessment or a Re-assessment for this encounter?: Initial Assessment Marital status: Married ToccopolaMaiden name: NA Is patient pregnant?: Other (Comment)(male) Living Arrangements: Spouse/significant other, Children Can pt return to current living arrangement?: Yes Admission Status: Voluntary Is patient  capable of signing voluntary admission?: Yes Referral Source: Self/Family/Friend Insurance type: Paramedic     Crisis Care Plan Living Arrangements: Spouse/significant other, Children Legal Guardian: Other:(Self) Name of Psychiatrist: Pt can't remember Name of Therapist: Pt can't remember  Education Status Is patient currently in school?: No Is the patient employed, unemployed or receiving disability?: Unemployed(retired)  Risk to self with the past 6 months Suicidal Ideation: No Has patient been a risk to self within the past 6 months prior to  admission? : No Suicidal Intent: No Has patient had any suicidal intent within the past 6 months prior to admission? : No Is patient at risk for suicide?: No Suicidal Plan?: No Has patient had any suicidal plan within the past 6 months prior to admission? : No Access to Means: No What has been your use of drugs/alcohol within the last 12 months?: none Previous Attempts/Gestures: No How many times?: 0 Other Self Harm Risks: none Triggers for Past Attempts: None known Intentional Self Injurious Behavior: None Family Suicide History: Unknown Recent stressful life event(s): Recent negative physical changes(had seizure Sunday) Persecutory voices/beliefs?: No Depression: No Substance abuse history and/or treatment for substance abuse?: No Suicide prevention information given to non-admitted patients: Not applicable  Risk to Others within the past 6 months Homicidal Ideation: No Does patient have any lifetime risk of violence toward others beyond the six months prior to admission? : No Thoughts of Harm to Others: No Current Homicidal Intent: No Current Homicidal Plan: No Access to Homicidal Means: No Identified Victim: none History of harm to others?: No Assessment of Violence: None Noted Violent Behavior Description: none Does patient have access to weapons?: No Criminal Charges Pending?: No Does patient have a court date: No Is patient on probation?: No  Psychosis Hallucinations: (per collateral information) Delusions: None noted  Mental Status Report Appearance/Hygiene: Poor hygiene, In scrubs Eye Contact: Good Motor Activity: Unable to assess Speech: Logical/coherent Level of Consciousness: Alert Mood: Pleasant Affect: Appropriate to circumstance Anxiety Level: Minimal Thought Processes: Thought Blocking Judgement: Partial Orientation: Person, Place Obsessive Compulsive Thoughts/Behaviors: None  Cognitive Functioning Concentration: Decreased Memory: Recent  Impaired, Remote Intact Is patient IDD: No Is patient DD?: No Insight: Poor Impulse Control: Fair Appetite: Good Have you had any weight changes? : No Change Sleep: No Change Total Hours of Sleep: 8 Vegetative Symptoms: None  ADLScreening [facility] Assessment Services) Patient's cognitive ability adequate to safely complete daily activities?: Yes Patient able to express need for assistance with ADLs?: Yes Independently performs ADLs?: Yes (appropriate for developmental age)  Prior Inpatient Therapy Prior Inpatient Therapy: Yes Prior Therapy Dates: 2016 Prior Therapy Facilty/Provider(s): [facility] Reason for Treatment: confusion  Prior Outpatient Therapy Prior Outpatient Therapy: Yes Prior Therapy Dates: current per pt Prior Therapy Facilty/Provider(s): pt can't remember Reason for Treatment: UTA Does patient have an ACCT team?: No Does patient have Intensive In-House Services?  : No Does patient have [facility] services? : No Does patient have P4CC services?: No  ADL Screening (condition at time of admission) Patient's cognitive ability adequate to safely complete daily activities?: Yes Patient able to express need for assistance with ADLs?: Yes Independently performs ADLs?: Yes (appropriate for developmental age)       Abuse/Neglect Assessment (Assessment to be complete while patient is alone) Abuse/Neglect Assessment Can Be Completed: Yes Physical Abuse: Denies Verbal Abuse: Denies Sexual Abuse: Denies Exploitation of patient/patient's resources: Denies Self-Neglect: Denies Values / Beliefs Cultural Requests During Hospitalization: None Spiritual Requests During Hospitalization: None Consults Spiritual Care Consult Needed:  No Social Work Consult Needed: No            Disposition:  Disposition Initial Assessment Completed for this Encounter: Yes    NP Nira Conn recommends the pt be observed overnight for safety and stabilization.  The pt is to be reassessed  by psychiatry in the morning.  RN Jeanice Lim and MD Bebe Shaggy were made aware of the recommendation.    On Site Evaluation by:   Reviewed with Physician:    Ottis Stain 09/27/2017 6:01 AM

## 2017-09-27 NOTE — ED Provider Notes (Signed)
I have reassessed the patient by request of psychiatric nurse practitioner.  They have been able to get very little history from the patient and reports that he will not interact.   The patient is sitting fairly immobile in a chair.  He is staring straight ahead while the television is off to the side.  He is slightly tremulous.  Patient acknowledges me as I come into the room.  He does tentatively reach out to shake my hand as I introduce myself as the doctor.  Does not have toxic or septic appearance.  He appears alert but frightened.  Patient is very tentative and offering any history.  He seems to be taking a long time to reflect on questions and starts with a slow partial answer.  I asked about his family, he did report he has a wife, he did not really answer about her children and then from there he volunteered in a quavering voice that he had a sister.  I went on then to ask him more about his sister is at seem to be something he wished to talk about.  He stated that she had died but did not give me any more details about when, or how this was impacting him.  He seemed emotionally upset.  Patient seemed to be getting more more fearful and startling to voices outside the room.  I asked him about visual hallucinations, the patient stated that "that is a different story" and would not elaborate anymore.  Heart regular no rub murmur gallop.  Lungs clear no wheeze rhonchi or rale.  Abdomen is soft without guarding.  No peripheral edema.  Calves are soft nontender.  Patient does follow commands.  Nonfocal neurologic examination.  He is using all extremities purposely.  He helps to pull up the pant leg so I can examine his legs.  Patient was evaluated yesterday.  No medical cause identified for the patient's mental status change.  Based on my interaction with the patient today, I have the impression of acute psychotic break possibly superimposed on major depression.  The few things he did directly reference,  were in relationship to loss and sadness.  The patient does not appear to be in a state of delirium.  There is no inappropriate or random picking behaviors or distraction by inanimate objects.  He has not been wandering.  Rather than tangential speech, speech is very limited to a partial sentence at a time.  Patient is currently being treated for urinary tract infection.  His vital signs have remained stable.  He has not developed fever, tachycardia or hypotension.  I have low suspicion at this time that this mental status changes in association with the UTI.  Will need to finish treatment.  Based on patient's current presentation, I do not feel that he is safe to be at home.  He appears at risk for self-neglect or inappropriate decision-making that could result in self injury or harm.  Patient has been accepted to behavioral health hospital.  I have filled out IVC paperwork for the patient's hospitalization.     Arby BarrettePfeiffer, Lemuel Boodram, MD 09/27/17 1622
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
## 2017-09-27 NOTE — ED Notes (Signed)
TTS recommends pt to be reassessed by psychiatry

## 2017-09-27 NOTE — Progress Notes (Signed)
Progress note  Pt found in the shower disoriented. Pt assessed and found to be disoriented x4 and nonverbal. Pt's medication held and passed to Best BuyHeather RN. Pt's neurological system assessed and pt could not follow this writer's finger. Pt refuses/can't answer questions. Pt is very unstable with his gait and puts most of his weight on the person walking with him. Brooke Charity fundraiserN was notified and he advised to observe and assess. Pt was said to be verbal in admission assessment and cooperative with dressing. Pt in bed now. Pt safe on the unit. [id number] safety checks implemented and continued. Will continue to monitor.
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
## 2017-09-27 NOTE — ED Notes (Signed)
Patient to CT and returned-patient is very fearful of staff and procedures-verbal support given

## 2017-09-27 NOTE — BH Assessment (Signed)
Attempted to complete TTS assessment.  Per RN, the pt has been in the restroom for 20 minutes.  TTS waited for the pt to come out of the bathroom.  RN to contact TTS when the pt exits the restroom.

## 2017-09-27 NOTE — ED Notes (Signed)
Pt pacing, currently using the restroom

## 2017-09-27 NOTE — ED Notes (Signed)
Pt dressed out in purple scrubs and yellow skid socks

## 2017-09-27 NOTE — ED Notes (Signed)
RN attempted to encourage pt to eat. Pt spit back food at RN and still not responding appropriately.Pt stares at nurse and only moves in small increments. Pt encouraged to drink and grabbed RN's hand, refusing to drink
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
## 2017-09-27 NOTE — ED Notes (Signed)
Pt is attempting to pull on glass at the nurses station. Pt encouraged not to do so. Pt laughing at nurse and talking in incomplete sentences.

## 2017-09-27 NOTE — Tx Team (Signed)
Initial Treatment Plan 09/27/2017 6:21 PM Walter Holder Isbell [id number]    PATIENT STRESSORS: Health problems   PATIENT STRENGTHS: Ability for insight Average or above average intelligence Supportive family/friends   PATIENT IDENTIFIED PROBLEMS: Confusion "I don't know" "My wife needs me at home, I take care of her"                     DISCHARGE CRITERIA:  Ability to meet basic life and health needs Improved stabilization in mood, thinking, and/or behavior  PRELIMINARY DISCHARGE PLAN: Attend aftercare/continuing care group Return to previous living arrangement  PATIENT/FAMILY INVOLVEMENT: This treatment plan has been presented to and reviewed with the patient, Walter Holder Halliday, and/or family member, .  The patient and family have been given the opportunity to ask questions and make suggestions.  Homer Pfeifer, [place], [place]RN 09/27/2017, 6:21 PM

## 2017-09-27 NOTE — ED Notes (Signed)
Upon arrival to the unit, pt confused and asking about cartoons.  Pt wandered into hall and was redirected to room.

## 2017-09-27 NOTE — Progress Notes (Signed)
1:1 initiation note: D:  Walter Holder was laying in bed, confused/disoriented and trying to get up.  He was unsteady on his feet and started to fall but was assisted to the bed by staff.  He was oriented x 2-15 minutes prior. As RN was making arrangements, he got out of bed quickly and walked to the nurses station.  He then was weak and needed assistance to walk back to his room.  1:1 initiated for safety and sitter was placed at bedside.  Pt was moved to room closer to the nurses station.  Verbal order obtained from Nira ConnJason Berry NP.   A:  1:1 will continue for safety.   R:  Walter Holder remains safe on the unit.

## 2017-09-27 NOTE — ED Notes (Signed)
Pt did not talk to his wife for long and began pressing random buttons on the telephone. Pt states "She's going to die"

## 2017-09-27 NOTE — Progress Notes (Signed)
Onalee HuaDavid is a 62 year old male pt admitted on involuntary basis. On admission he appears confused and does not know why he is here. When explained to him why he was here he reports that he does not remember having a seizure. He denies any SI and is able to contract for safety on the unit. He does endorse chronic back pain, he does endorse that he has a doctor but is unsure of who it is. He does endorse being on medications but is unable to recall what exactly he is on. He does report that he lives with his wife and does report that he is able to go back there. He was apprehensive about going to the unit and spoke about how there is no one to take care of his wife and he spoke about how he helps her get to the bathroom. Onalee HuaDavid did need prompting and re-direction to get back onto the unit. Onalee HuaDavid was admitted to the unit and safety was maintained.

## 2017-09-27 NOTE — ED Notes (Signed)
Pt now talking to RN in complete sentences. Pt ate his entire meal and states "I am starving!" Pt given a sandwich and cheese stick.  Pt denies pain at this time.

## 2017-09-27 NOTE — Consult Note (Addendum)
[facility] Consult   Reason for Consult:  Depression with substance withdrawal Referring Physician:  EDP Patient Identification: KOTY ANCTIL MRN:  [id number] Principal Diagnosis: Major depressive disorder, recurrent, severe with psychotic features [facility]) Diagnosis:   Patient Active Problem List   Diagnosis Date Noted  . Major depressive disorder, recurrent, severe with psychotic features ([place]) [F33.3] 03/28/2014    Priority: High  . Benzodiazepine abuse, episodic ([place]) [F13.10] 03/28/2014    Priority: High  . Benign prostatic hyperplasia [N40.0] 08/22/2014  . Abscess and cellulitis [L03.90, L02.91] 08/14/2014  . Cellulitis of right lower extremity [L03.115] 08/14/2014  . Recurrent major depression-severe ([place]) [F33.2] 03/29/2014  . Opiate dependence ([place]) [F11.20] 03/28/2014  . Colitis due to Clostridium difficile [A04.72] 02/29/2012    Class: Acute  . Hypokalemia [E87.6] 02/29/2012    Class: Acute  . Low back pain [M54.5] 02/29/2012    Class: Chronic  . Acute infective gastroenteritis [A09] 02/28/2012  . Dehydration [E86.0] 02/28/2012  . Tachycardia [R00.0] 02/28/2012  . Heme positive stool [R19.5] 02/28/2012    Total Time spent with patient: 45 minutes  Subjective:   Walter Holder is a 62 y.o. male patient admitted with altered mental status in the setting of withdrawal.  HPI:  62 yo male brought to the ED by his family for AMS after a seizure, most likely caused by Xanax withdrawal (2 mg TID) which he must have obtained from the streets since the PMP indicates no refill history for Xanax (PMP does indicate recent prescriptions for Buprenorphine).  After the seizure, he was having hallucinations but denied on assessment this morning.  This afternoon he did tell this provider that he is hearing voices that I am coming toward him which is scaring him.  Psychosis in the past but none lately.  Noted to have a UTI.  Does admit to using many Xanax and feels his  seizure was related to withdrawal symptoms.  Depressed and anxious, slow to respond in his room, thought blocking present, not sure about how much cognitive processing is working at this time; I.e  Not sure he comprehends questions.  Past Psychiatric History: depression, substance abuse  Risk to Self: Suicidal Ideation: No Suicidal Intent: No Is patient at risk for suicide?: No Suicidal Plan?: No Access to Means: No What has been your use of drugs/alcohol within the last 12 months?: none How many times?: 0 Other Self Harm Risks: none Triggers for Past Attempts: None known Intentional Self Injurious Behavior: None Risk to Others: Homicidal Ideation: No Thoughts of Harm to Others: No Current Homicidal Intent: No Current Homicidal Plan: No Access to Homicidal Means: No Identified Victim: none History of harm to others?: No Assessment of Violence: None Noted Violent Behavior Description: none Does patient have access to weapons?: No Criminal Charges Pending?: No Does patient have a court date: No Prior Inpatient Therapy: Prior Inpatient Therapy: Yes Prior Therapy Dates: 2016 Prior Therapy Facilty/Provider(s): [facility] Reason for Treatment: confusion Prior Outpatient Therapy: Prior Outpatient Therapy: Yes Prior Therapy Dates: current per pt Prior Therapy Facilty/Provider(s): pt can't remember Reason for Treatment: UTA Does patient have an ACCT team?: No Does patient have Intensive In-House Services?  : No Does patient have [facility] services? : No Does patient have [facility] services?: No  Past Medical History:  Past Medical History:  Diagnosis Date  . Anginal pain ([place]) ~ 2007  . Arthritis    "might be getting some" (02/28/2012)  . C. difficile diarrhea ~ 2008; 02/28/2012  . Chronic  lower back pain   . Complication of anesthesia    "my ears rang before I went out" (02/28/2012)  . GERD (gastroesophageal reflux disease)   . Stress at home    "from my daughter mostly"  (02/28/2012)    Past Surgical History:  Procedure Laterality Date  . CARDIAC CATHETERIZATION  ~ 2007  . MULTIPLE TOOTH EXTRACTIONS  1990's   "3" (02/28/2012)   Family History:  Family History  Problem Relation Age of Onset  . Alzheimer's disease Mother   . Diabetes Mellitus II Neg Hx    Family Psychiatric  History: none Social History:  Social History   Substance and Sexual Activity  Alcohol Use No   Comment: 02/28/2012 "5-6 beers on the weekends" 08/22/14 none.     Social History   Substance and Sexual Activity  Drug Use Yes  . Types: Marijuana, LSD, Benzodiazepines, Cocaine   Comment: 02/28/2012 "back in the 1970's"     Social History   Socioeconomic History  . Marital status: Married    Spouse name: Not on file  . Number of children: Not on file  . Years of education: Not on file  . Highest education level: Not on file  Occupational History  . Not on file  Social Needs  . Financial resource strain: Not on file  . Food insecurity:    Worry: Not on file    Inability: Not on file  . Transportation needs:    Medical: Not on file    Non-medical: Not on file  Tobacco Use  . Smoking status: Never Smoker  . Smokeless tobacco: Never Used  Substance and Sexual Activity  . Alcohol use: No    Comment: 02/28/2012 "5-6 beers on the weekends" 08/22/14 none.  . Drug use: Yes    Types: Marijuana, LSD, Benzodiazepines, Cocaine    Comment: 02/28/2012 "back in the 1970's"   . Sexual activity: Yes  Lifestyle  . Physical activity:    Days per week: Not on file    Minutes per session: Not on file  . Stress: Not on file  Relationships  . Social connections:    Talks on phone: Not on file    Gets together: Not on file    Attends religious service: Not on file    Active member of club or organization: Not on file    Attends meetings of clubs or organizations: Not on file    Relationship status: Not on file  Other Topics Concern  . Not on file  Social History Narrative  .  Not on file   Additional Social History: N/A    Allergies:  No Known Allergies  Labs:  Results for orders placed or performed during the hospital encounter of 09/26/17 (from the past 48 hour(s))  Comprehensive metabolic panel     Status: Abnormal   Collection Time: 09/27/17 12:03 AM  Result Value Ref Range   Sodium 145 135 - 145 mmol/L   Potassium 3.0 (L) 3.5 - 5.1 mmol/L   Chloride 108 98 - 111 mmol/L    Comment: Please note change in reference range.   CO2 27 22 - 32 mmol/L   Glucose, Bld 109 (H) 70 - 99 mg/dL    Comment: Please note change in reference range.   BUN 16 6 - 20 mg/dL    Comment: Please note change in reference range.   Creatinine, Ser 0.84 0.61 - 1.24 mg/dL   Calcium 9.2 8.9 - 10.3 mg/dL   Total Protein 7.7 6.5 -  8.1 g/dL   Albumin 4.0 3.5 - 5.0 g/dL   AST 29 15 - 41 U/L   ALT 24 0 - 44 U/L    Comment: Please note change in reference range.   Alkaline Phosphatase 66 38 - 126 U/L   Total Bilirubin 0.7 0.3 - 1.2 mg/dL   GFR calc non Af Amer >60 >60 mL/min   GFR calc Af Amer >60 >60 mL/min    Comment: (NOTE) The eGFR has been calculated using the CKD EPI equation. This calculation has not been validated in all clinical situations. eGFR's persistently <60 mL/min signify possible Chronic Kidney Disease.    Anion gap 10 5 - 15    Comment: Performed at [facility], [place] [address], [place], [place] [address]  CBC WITH DIFFERENTIAL     Status: Abnormal   Collection Time: 09/27/17 12:03 AM  Result Value Ref Range   WBC 10.7 (H) 4.0 - 10.5 K/uL   RBC 4.62 4.22 - 5.81 MIL/uL   Hemoglobin 13.5 13.0 - 17.0 g/dL   HCT 39.3 39.0 - 52.0 %   MCV 85.1 78.0 - 100.0 fL   MCH 29.2 26.0 - 34.0 pg   MCHC 34.4 30.0 - 36.0 g/dL   RDW 15.3 11.5 - 15.5 %   Platelets 222 150 - 400 K/uL   Neutrophils Relative % 73 %   Neutro Abs 7.8 (H) 1.7 - 7.7 K/uL   Lymphocytes Relative 20 %   Lymphs Abs 2.2 0.7 - 4.0 K/uL   Monocytes Relative 7 %   Monocytes  Absolute 0.7 0.1 - 1.0 K/uL   Eosinophils Relative 0 %   Eosinophils Absolute 0.0 0.0 - 0.7 K/uL   Basophils Relative 0 %   Basophils Absolute 0.0 0.0 - 0.1 K/uL    Comment: Performed at [facility], [place] [address], [place], [place] [address]  Ammonia     Status: None   Collection Time: 09/27/17 12:03 AM  Result Value Ref Range   Ammonia 19 9 - 35 umol/L    Comment: Performed at [facility], [place] [address] [place], [place] [address]  Urinalysis, Routine w reflex microscopic     Status: Abnormal   Collection Time: 09/27/17 12:03 AM  Result Value Ref Range   Color, Urine YELLOW YELLOW   APPearance HAZY (A) CLEAR   Specific Gravity, Urine 1.011 1.005 - 1.030   pH 7.0 5.0 - 8.0   Glucose, UA NEGATIVE NEGATIVE mg/dL   Hgb urine dipstick SMALL (A) NEGATIVE   Bilirubin Urine NEGATIVE NEGATIVE   Ketones, ur 5 (A) NEGATIVE mg/dL   Protein, ur NEGATIVE NEGATIVE mg/dL   Nitrite NEGATIVE NEGATIVE   Leukocytes, UA MODERATE (A) NEGATIVE   RBC / HPF 6-10 0 - 5 RBC/hpf   WBC, UA 21-50 0 - 5 WBC/hpf   Bacteria, UA MANY (A) NONE SEEN   Squamous Epithelial / LPF 0-5 0 - 5   Mucus PRESENT    Hyaline Casts, UA PRESENT     Comment: Performed at [facility], [place] [address], [place], [place] [address]  Ethanol     Status: None   Collection Time: 09/27/17 12:03 AM  Result Value Ref Range   Alcohol, Ethyl (B) <10 <10 mg/dL    Comment: (NOTE) Lowest detectable limit for serum alcohol is 10 mg/dL. For medical purposes only. Performed at [facility], [place] [address], [place], [place] [address]   Urine rapid drug screen (hosp  performed)     Status: Abnormal   Collection Time: 09/27/17 12:03 AM  Result Value Ref Range   Opiates NONE DETECTED NONE DETECTED   Cocaine NONE DETECTED NONE DETECTED   Benzodiazepines NONE DETECTED NONE DETECTED   Amphetamines NONE DETECTED NONE DETECTED   Tetrahydrocannabinol NONE DETECTED  NONE DETECTED   Barbiturates (A) NONE DETECTED    Result not available. Reagent lot number recalled by manufacturer.    Comment: Performed at [facility], [place] [address], [place], [place] [address]  Acetaminophen level     Status: Abnormal   Collection Time: 09/27/17 12:03 AM  Result Value Ref Range   Acetaminophen (Tylenol), Serum <10 (L) 10 - 30 ug/mL    Comment: (NOTE) Therapeutic concentrations vary significantly. A range of 10-30 ug/mL  may be an effective concentration for many patients. However, some  are best treated at concentrations outside of this range. Acetaminophen concentrations >150 ug/mL at 4 hours after ingestion  and >50 ug/mL at 12 hours after ingestion are often associated with  toxic reactions. Performed at [facility], [place] [address], [place], [place] [address]   Salicylate level     Status: None   Collection Time: 09/27/17 12:03 AM  Result Value Ref Range   Salicylate Lvl <5.8 2.8 - 30.0 mg/dL    Comment: Performed at [facility], [place] [address] [place], [place] [address]  CK     Status: Abnormal   Collection Time: 09/27/17 12:03 AM  Result Value Ref Range   Total CK 602 (H) 49 - 397 U/L    Comment: Performed at [facility], [place] [address] [place], [place] [address]    Current Facility-Administered Medications  Medication Dose Route Frequency Provider Last Rate Last Dose  . 0.9 %  sodium chloride infusion   Intravenous Continuous Ripley Fraise, MD   Stopped at 09/27/17 0007  . cephALEXin (KEFLEX) capsule 500 mg  500 mg Oral Q8H Ripley Fraise, MD   500 mg at 09/27/17 0558  . DULoxetine (CYMBALTA) DR capsule 30 mg  30 mg Oral QHS Ripley Fraise, MD      . haloperidol (HALDOL) tablet 5 mg  5 mg Oral BID Patrecia Pour, NP      . hydrOXYzine (ATARAX/VISTARIL) tablet 25 mg  25 mg Oral Q6H PRN Patrecia Pour, NP      . loperamide (IMODIUM) capsule 2-4 mg  2-4 mg Oral PRN  Patrecia Pour, NP      . LORazepam (ATIVAN) tablet 1 mg  1 mg Oral Q6H PRN Patrecia Pour, NP      . LORazepam (ATIVAN) tablet 1 mg  1 mg Oral QID Patrecia Pour, NP       Followed by  . [START ON 09/28/2017] LORazepam (ATIVAN) tablet 1 mg  1 mg Oral TID Patrecia Pour, NP       Followed by  . [START ON 09/29/2017] LORazepam (ATIVAN) tablet 1 mg  1 mg Oral BID Patrecia Pour, NP       Followed by  . [START ON 10/01/2017] LORazepam (ATIVAN) tablet 1 mg  1 mg Oral Daily Lord, Jamison Y, NP      . multivitamin with minerals tablet 1 tablet  1 tablet Oral Daily Lord, Jamison Y, NP      . ondansetron (ZOFRAN-ODT) disintegrating tablet 4 mg  4 mg Oral Q6H PRN Patrecia Pour, NP      . thiamine (B-1)  injection 100 mg  100 mg Intramuscular Once Patrecia Pour, NP      . Derrill Memo ON 09/28/2017] thiamine (VITAMIN B-1) tablet 100 mg  100 mg Oral Daily Patrecia Pour, NP       Current Outpatient Medications  Medication Sig Dispense Refill  . alprazolam (XANAX) 2 MG tablet Take 2 mg by mouth 3 (three) times daily as needed for sleep.    . Buprenorphine HCl-Naloxone HCl (SUBOXONE) 8-2 MG FILM Place 1 Film under the tongue at bedtime.    . DULoxetine (CYMBALTA) 30 MG capsule Take 30 mg by mouth at bedtime.    . Melatonin 3 MG TABS Take 3-9 mg by mouth at bedtime as needed (SLEEP).    . vitamin B-12 (CYANOCOBALAMIN) 1000 MCG tablet Take 1,000 mcg by mouth daily.      Musculoskeletal: Strength & Muscle Tone: within normal limits Gait & Station: normal Patient leans: N/A  Psychiatric Specialty Exam: Physical Exam  Nursing note and vitals reviewed. Constitutional: He is oriented to person, place, and time. He appears well-developed and well-nourished.  HENT:  Head: Normocephalic and atraumatic.  Neck: Normal range of motion.  Respiratory: Effort normal.  Musculoskeletal: Normal range of motion.  Neurological: He is alert and oriented to person, place, and time.  Psychiatric: Judgment and  thought content normal. His affect is blunt. He is slowed and withdrawn. Cognition and memory are impaired. He exhibits a depressed mood. He is noncommunicative.    Review of Systems  Psychiatric/Behavioral: Positive for depression and substance abuse. The patient is nervous/anxious.   All other systems reviewed and are negative.   Blood pressure (!) 175/62, pulse 85, temperature 99.4 F (37.4 C), temperature source Rectal, resp. rate 16, SpO2 97 %.There is no height or weight on file to calculate BMI.  General Appearance: Disheveled  Eye Contact:  Fair  Speech:  Slow  Volume:  Decreased  Mood:  Anxious and Depressed  Affect:  Blunt  Thought Process:  Coherent and Descriptions of Associations: Intact  Orientation:  Full (Time, Place, and Person)  Thought Content:  Hallucinations: Auditory  Suicidal Thoughts:  No  Homicidal Thoughts:  No  Memory:  Immediate;   Fair Recent;   Fair Remote;   Fair  Judgement:  Impaired  Insight:  Fair  Psychomotor Activity:  Decreased  Concentration:  Concentration: Fair and Attention Span: Fair  Recall:  [employer] of Knowledge:  Fair  Language:  Fair  Akathisia:  No  Handed:  Right  AIMS (if indicated):   N/A  Assets:  Housing Leisure Time Physical Health Resilience Social Support  ADL's:  Intact  Cognition:  Impaired,  Moderate  Sleep:   N/A     Treatment Plan Summary: Daily contact with patient to assess and evaluate symptoms and progress in treatment, Medication management and Plan major depressive disorder, recurrent, severe with psychosis:  -Crisis stabilization -Medication management:  Started Ativan alcohol detox protocol for benzodiazepine withdrawal and Haldol 5 mg BID for psychosis -Individual counseling  Disposition: Recommend psychiatric Inpatient admission when medically cleared.  Waylan Boga, NP 09/27/2017 1:11 PM   Patient seen face-to-face for psychiatric evaluation, chart reviewed and case discussed with the  physician extender and developed treatment plan. Reviewed the information documented and agree with the treatment plan.  Buford Dresser, DO 09/27/17 3:43 PM

## 2017-09-27 NOTE — ED Notes (Signed)
Verbal consent given by pt to speak to wife and to have visitors

## 2017-09-27 NOTE — ED Provider Notes (Signed)
[facility]-EMERGENCY DEPT Provider Note   CSN: [id number] Arrival date & time: 09/26/17  2337     History   Chief Complaint Chief Complaint  Patient presents with  . Altered Mental Status  . Hallucinations   Level 5 caveat due to altered mental status HPI Walter Holder is a 62 y.o. male.  The history is provided by the patient. The history is limited by the condition of the patient.  Altered Mental Status   This is a new problem. Episode onset: Unknown. The problem has been gradually worsening. Associated symptoms include confusion.  Patient with history of chronic pain presents with altered mental status.  Minimal details are known on arrival.  Per EMS, patient's altered mental status started over 2 days ago.  Per EMS, patient is prescribed Suboxone but has not been taking the drug.  He was [facility] in [facility] emergency department [facility] July [facility] for seizures.  He had a negative CT head at that time.  No other details are known. CBG > 100 per EMS  Past Medical History:  Diagnosis Date  . Anginal pain (HCC) ~ 2007  . Arthritis    "might be getting some" (02/28/2012)  . C. difficile diarrhea ~ 2008; 02/28/2012  . Chronic lower back pain   . Complication of anesthesia    "my ears rang before I went out" (02/28/2012)  . GERD (gastroesophageal reflux disease)   . Stress at home    "from my daughter mostly" (02/28/2012)    Patient Active Problem List   Diagnosis Date Noted  . Benign prostatic hyperplasia 08/22/2014  . Abscess and cellulitis 08/14/2014  . Cellulitis of right lower extremity 08/14/2014  . Recurrent major depression-severe (HCC) 03/29/2014  . Major depressive disorder, recurrent, severe with psychotic features (HCC) 03/28/2014  . Opiate dependence (HCC) 03/28/2014  . Benzodiazepine abuse, episodic (HCC) 03/28/2014  . Colitis due to Clostridium difficile 02/29/2012    Class: Acute  . Hypokalemia 02/29/2012    Class: Acute  .  Low back pain 02/29/2012    Class: Chronic  . Acute infective gastroenteritis 02/28/2012  . Dehydration 02/28/2012  . Tachycardia 02/28/2012  . Heme positive stool 02/28/2012    Past Surgical History:  Procedure Laterality Date  . CARDIAC CATHETERIZATION  ~ 2007  . MULTIPLE TOOTH EXTRACTIONS  1990's   "3" (02/28/2012)        Home Medications    Prior to Admission medications   Medication Sig Start Date End Date Taking? Authorizing Provider  alprazolam Prudy Feeler(XANAX) 2 MG tablet Take 2 mg by mouth 3 (three) times daily as needed for sleep.    [provider]  Buprenorphine HCl-Naloxone HCl (SUBOXONE) 8-2 MG FILM Place 1 Film under the tongue at bedtime.    [provider]  clindamycin (CLEOCIN) 300 MG capsule Take 1 capsule (300 mg total) by mouth 3 (three) times daily. Patient not taking: Reported on 09/24/2017 11/19/14   Linna HoffKindl, James D, MD  diclofenac (CATAFLAM) 50 MG tablet Take 1 tablet (50 mg total) by mouth 3 (three) times daily. Patient not taking: Reported on 09/24/2017 11/19/14   Linna HoffKindl, James D, MD  doxycycline (VIBRA-TABS) 100 MG tablet Take 1 tablet (100 mg total) by mouth every 12 (twelve) hours. Patient not taking: Reported on 09/24/2017 08/17/14   Joseph ArtVann, Jessica U, DO  DULoxetine (CYMBALTA) 30 MG capsule Take 30 mg by mouth at bedtime.    [provider]  HYDROcodone-acetaminophen (NORCO/VICODIN) 5-325 MG per tablet  Take 1-2 tablets by mouth every 4 (four) hours as needed for moderate pain or severe pain (pain). 08/17/14   Joseph Art, DO  Melatonin 3 MG TABS Take 3-9 mg by mouth at bedtime as needed (SLEEP).    [provider]  Multiple Vitamin (MULTIVITAMIN WITH MINERALS) TABS tablet Take 1 tablet by mouth daily. For low vitamin Patient not taking: Reported on 09/24/2017 04/05/14   Armandina Stammer I, NP  sertraline (ZOLOFT) 25 MG tablet Take 5 tablets (125 mg total) by mouth daily. For depression Patient not taking: Reported on 09/24/2017 08/22/14    Hoy Register, MD  tamsulosin (FLOMAX) 0.4 MG CAPS capsule Take 1 capsule (0.4 mg total) by mouth daily. Patient not taking: Reported on 09/24/2017 08/22/14   Hoy Register, MD  vitamin B-12 (CYANOCOBALAMIN) 1000 MCG tablet Take 1,000 mcg by mouth daily.    [provider]    Family History Family History  Problem Relation Age of Onset  . Alzheimer's disease Mother   . Diabetes Mellitus II Neg Hx     Social History Social History   Tobacco Use  . Smoking status: Never Smoker  . Smokeless tobacco: Never Used  Substance Use Topics  . Alcohol use: No    Comment: 02/28/2012 "5-6 beers on the weekends" 08/22/14 none.  . Drug use: Yes    Types: Marijuana, LSD, Benzodiazepines, Cocaine    Comment: 02/28/2012 "back in the 1970's"      Allergies   Patient has no known allergies.   Review of Systems Review of Systems  Unable to perform ROS: Mental status change  Psychiatric/Behavioral: Positive for confusion.     Physical Exam Updated Vital Signs BP (!) 159/80 (BP Location: Left Arm)   Pulse 71   Temp 99.4 F (37.4 C) (Rectal)   Resp 19   SpO2 100%   Physical Exam CONSTITUTIONAL: Disheveled, appears confused HEAD: Normocephalic/atraumatic EYES: EOMI/PERRL ENMT: Mucous membranes moist NECK: supple no meningeal signs SPINE/BACK:entire spine nontender CV: S1/S2 noted, no murmurs/rubs/gallops noted LUNGS: Lungs are clear to auscultation bilaterally, no apparent distress ABDOMEN: soft, nontender, no rebound or guarding, bowel sounds noted throughout abdomen NEURO: Pt is awake/alert.  He will follow commands, but only minimally speak.  He appears confused and apprehensive.  Moves all extremities x4 EXTREMITIES: pulses normal/equal, full ROM SKIN: warm, color normal PSYCH: Mildly anxious   ED Treatments / Results  Labs (all labs ordered are listed, but only abnormal results are displayed) Labs Reviewed  COMPREHENSIVE METABOLIC PANEL - Abnormal; Notable for  the following components:      Result Value   Potassium 3.0 (*)    Glucose, Bld 109 (*)    All other components within normal limits  CBC WITH DIFFERENTIAL/PLATELET - Abnormal; Notable for the following components:   WBC 10.7 (*)    Neutro Abs 7.8 (*)    All other components within normal limits  URINALYSIS, ROUTINE W REFLEX MICROSCOPIC - Abnormal; Notable for the following components:   APPearance HAZY (*)    Hgb urine dipstick SMALL (*)    Ketones, ur 5 (*)    Leukocytes, UA MODERATE (*)    Bacteria, UA MANY (*)    All other components within normal limits  RAPID URINE DRUG SCREEN, HOSP PERFORMED - Abnormal; Notable for the following components:   Barbiturates   (*)    Value: Result not available. Reagent lot number recalled by manufacturer.   All other components within normal limits  ACETAMINOPHEN LEVEL - Abnormal; Notable  for the following components:   Acetaminophen (Tylenol), Serum <10 (*)    All other components within normal limits  CK - Abnormal; Notable for the following components:   Total CK 602 (*)    All other components within normal limits  AMMONIA  ETHANOL  SALICYLATE LEVEL    EKG EKG Interpretation  Date/Time:  Tuesday September 26 2017 23:50:05 EDT Ventricular Rate:  69 PR Interval:    QRS Duration: 93 QT Interval:  385 QTC Calculation: 413 R Axis:   -12 Text Interpretation:  Sinus rhythm Short PR interval Abnormal R-wave progression, early transition Borderline T abnormalities, inferior leads Interpretation limited secondary to artifact Confirmed by Zadie Rhine ([id number]) on 09/27/2017 12:01:28 AM   Radiology Ct Head Wo Contrast  Result Date: 09/27/2017 CLINICAL DATA:  Altered level of consciousness. Recent seizure. Hallucinations. EXAM: CT HEAD WITHOUT CONTRAST TECHNIQUE: Contiguous axial images were obtained from the base of the skull through the vertex without intravenous contrast. COMPARISON:  CT HEAD September 24, 2017. FINDINGS: BRAIN: No  intraparenchymal hemorrhage, mass effect nor midline shift. Absent septum flu symptoms. No hydrocephalus or parenchymal brain volume loss for age. No acute large vascular territory infarcts. No abnormal extra-axial fluid collections. Basal cisterns are patent. VASCULAR: Moderate calcific atherosclerosis of the carotid siphons. SKULL: No skull fracture. No significant scalp soft tissue swelling. SINUSES/ORBITS: The mastoid air-cells and included paranasal sinuses are well-aerated.The included ocular globes and orbital contents are non-suspicious. OTHER: None. IMPRESSION: 1. No acute intracranial process. 2. Absent septum pellucidum, otherwise negative non-contrast CT HEAD for age. Electronically Signed   By: Awilda Metro M.D.   On: 09/27/2017 00:54    Procedures Procedures    Medications Ordered in ED Medications  sodium chloride 0.9 % bolus 1,000 mL (0 mLs Intravenous Stopped 09/27/17 0134)    And  sodium chloride 0.9 % bolus 1,000 mL (0 mLs Intravenous Stopped 09/27/17 0135)    And  0.9 %  sodium chloride infusion ( Intravenous Hold 09/27/17 0007)  cephALEXin (KEFLEX) capsule 500 mg (has no administration in time range)  ALPRAZolam (XANAX) tablet 1 mg (has no administration in time range)  DULoxetine (CYMBALTA) DR capsule 30 mg (has no administration in time range)  potassium chloride SA (K-DUR,KLOR-CON) CR tablet 40 mEq (40 mEq Oral Given 09/27/17 0310)     Initial Impression / Assessment and Plan / ED Course  I have reviewed the triage vital signs and the nursing notes.  Pertinent labs & imaging results that were available during my care of the patient were reviewed by me and considered in my medical decision making (see chart for details).     12:43 AM Patient presents for altered mental status from home.  Unclear time of onset, but at least 2 days ago.  He did have a recent seizure, and had a ED evaluation and was discharged.  He appears confused, and mildly agitated.  He is  afebrile at this time.  Glucose is over 100.  Altered mental status work-up is now pending 1:33 AM Work-up thus far is unremarkable. Further labs are pending. 2:46 AM Discussed the case with his wife Eduardo Osier via the phone. Phone number [phone] She reports he had a seizure on July 7, and has not been to baseline since then.  He has no known history of seizures, but does have a neurology follow-up next month.  She reports since the seizure, he is been acting confused.  He appears to have some memory loss and at times has hallucinated.  She reports this is actually similar to an episode in 2016 when he was admitted to behavioral health. Review of chart reveals he was admitted in 2016, for depression, memory blocking and  Psychosis  At this point the only labs pending are drug screen.  Review of narcotic database reveals he takes Suboxone. Medically he appears to be stable.  No signs of infectious etiology.  No signs of stroke as he has been ambulatory no focal weakness Will need psych consult 4:56 AM He is noted to have UTI, will treat with Keflex.  However I do not feel this is causing all the symptoms.  He is not septic appearing, he is afebrile, with minimal elevation of white count.  I strongly suspect this is psychiatric in origin. We will consult psych at this time Final Clinical Impressions(s) / ED Diagnoses   Final diagnoses:  Confusion  Acute cystitis without hematuria    ED Discharge Orders    None       Zadie Rhine, MD 09/27/17 [id number]

## 2017-09-27 NOTE — ED Notes (Signed)
Bed: [facility]43 Expected date:  Expected time:  Means of arrival:  Comments: 

## 2017-09-27 NOTE — ED Notes (Signed)
Pt pulling at IV line and vital sign cords. Pt redirected and IV reinforced

## 2017-09-28 DIAGNOSIS — F05 Delirium due to known physiological condition: Secondary | ICD-10-CM

## 2017-09-28 MED ORDER — HALOPERIDOL 5 MG PO TABS
5.0000 mg | ORAL_TABLET | Freq: Every day | ORAL | Status: DC
  Administered 2017-09-29 – 2017-10-02 (×4): 5 mg via ORAL
  Filled 2017-09-28 (×7): qty 1

## 2017-09-28 NOTE — Progress Notes (Signed)
Nursing Progress Note: 7p-7a D: Pt currently presents with a anxiety/preoccupied/assertive affect and behavior. Pt states "I feel so much better than I did. I am feeling a lot more clear headed." Not interacting with the milieu. Pt reports good sleep during the previous night with current medication regimen. Pt did not attend wrap-up group.  A: Pt provided with medications per providers orders. Pt's labs and vitals were monitored throughout the night. Pt supported emotionally and encouraged to express concerns and questions. Pt educated on medications.  R: Pt's safety ensured with 15 minute and environmental checks. Pt currently denies SI, HI, and AVH. Pt verbally contracts to seek staff if SI,HI, or AVH occurs and to consult with staff before acting on any harmful thoughts. Will continue to monitor.

## 2017-09-28 NOTE — Progress Notes (Signed)
1:1 Note D:  Walter Holder is currently resting with his eyes closed.  He appears to be sleeping.  Breathing is even and unlabored.  Sitter remains by his side.   A:  1:1 sitter remains for safety. R:  Walter Holder remains safe on the unit.

## 2017-09-28 NOTE — B[facility] Group Notes (Signed)
LCSW Group Therapy Note  09/28/2017 1:15pm  Type of Therapy/Topic:  Group Therapy:  Balance in Life  Participation Level:  Did Not Attend  Description of Group:    This group will address the concept of balance and how it feels and looks when one is unbalanced. Patients will be encouraged to process areas in their lives that are out of balance and identify reasons for remaining unbalanced. Facilitators will guide patients in utilizing problem-solving interventions to address and correct the stressor making their life unbalanced. Understanding and applying boundaries will be explored and addressed for obtaining and maintaining a balanced life. Patients will be encouraged to explore ways to assertively make their unbalanced needs known to significant others in their lives, using other group members and facilitator for support and feedback.  Therapeutic Goals: 1. Patient will identify two or more emotions or situations they have that consume much of in their lives. 2. Patient will identify signs/triggers that life has become out of balance:  3. Patient will identify two ways to set boundaries in order to achieve balance in their lives:  4. Patient will demonstrate ability to communicate their needs through discussion and/or role plays  Summary of Patient Progress:      Therapeutic Modalities:   Cognitive Behavioral Therapy Solution-Focused Therapy Assertiveness Training  Ida RogueRodney B Donovan Persley, LCSW 09/28/2017 3:15 PM

## 2017-09-28 NOTE — BHH Suicide Risk Assessment (Signed)
[facility] Admission Suicide Risk Assessment   Nursing information obtained from:  Patient Demographic factors:  Male, Caucasian Current Mental Status:  NA Loss Factors:  Decline in physical health Historical Factors:  Prior suicide attempts Risk Reduction Factors:  Living with another person, especially a relative, Positive coping skills or problem solving skills  Total Time spent with patient: 1 hour   Principal Problem: Major depressive disorder, recurrent, severe with psychotic features (HCC) Diagnosis:   Patient Active Problem List   Diagnosis Date Noted  . Delirium due to another medical condition [F05] 09/28/2017  . Benign prostatic hyperplasia [N40.0] 08/22/2014  . Abscess and cellulitis [L03.90, L02.91] 08/14/2014  . Cellulitis of right lower extremity [L03.115] 08/14/2014  . Recurrent major depression-severe (HCC) [F33.2] 03/29/2014  . Major depressive disorder, recurrent, severe with psychotic features (HCC) [F33.3] 03/28/2014  . Opiate dependence (HCC) [F11.20] 03/28/2014  . 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 Colitis due to Clostridium difficile [A04.72] 02/29/2012    Class: Acute  . Hypokalemia [E87.6] 02/29/2012    Class: Acute  . Low back pain [M54.5] 02/29/2012    Class: Chronic  . Acute infective gastroenteritis [A09] 02/28/2012  . Dehydration [E86.0] 02/28/2012  . Tachycardia [R00.0] 02/28/2012  . Heme positive stool [R19.5] 02/28/2012   History of Present Illness: 62 yo male brought to the ED by his family for AMS after a seizure, most likely caused by Xanax withdrawal (2 mg TID)which he must have obtained from the streets since the PMP indicates no refill history for Xanax (PMP does indicate recent prescriptions for Buprenorphine). After the seizure, he was having hallucinations but denied on assessment this morning. This afternoon he did tell this provider that he is hearing voices that I am coming toward him which is scaring him.  Psychosis in the past but none lately. Noted to have a UTI. Does admit to using many Xanax and feels his seizure was related to withdrawal symptoms. Depressed and anxious, slow to respond in his room, thought blocking present, not sure about how much cognitive processing is working at this time; I.e Not sure he comprehends questions. The pt is not a good historian.  According to the pt's wife the pt has been having hallucinations. The pt denies having any hallucinations. He is currently living with his wife, son, and daughter. He denies any problems at home. The pt is able to state where he is and the president, but unable to give the year. The pt states he sees a psychiatrist, but he is unable to remember the psychiatrist's name. The pt denies current or past SI. He also denies any self harm and HI. He denies any legal issues. He reports he sleeps and eats well. He denies using any substances and his UDS is negative for all substances. It is noted the pt has a UTI.  He is alert . Pt speaks in a clear tone, at moderate volume and normal pace. Eye contact is good. Pt's mood ispleasant. Thought process isblocked.When asked simple questions, such as the year, the pt gave long pauses and then would eventually answer, "I don't know".There is no indication Pt is currently responding to internal stimuli or experiencing delusional thought content.?Pt was cooperative throughout assessment.   On initial assessment, patient reports that he has been on either subutex or suboxone from his pain doctor for his chronic back pain.  He states that it is very expensive, and he has not had it in a few days.  He states that he  lives with his wife and 60 year old son, but that he is the primary care giver for his wife.  He states that he is feeling oversedated on the medication and getting light headed.  He denies SI, HI stating his family needs him.  He denies AVH, delusions or paranoia.  He does not appear to  be responding to internal stimuli.  He spends majority of day in bed. Reports a good appetite and states he is resting well.  Walter Balmavid B Hutsonhas agreed to continue the current plan of care already in progress. He denies any other issues or concerns. Support encouragement reassurance was provided. Patient signs for voluntary admission.     Continued Clinical Symptoms:  Alcohol Use Disorder Identification Test Final Score (AUDIT): 1 The "Alcohol Use Disorders Identification Test", Guidelines for Use in Primary Care, Second Edition.  World Science writerHealth Organization [facility](WHO). Score between 0-7:  no or low risk or alcohol related problems. Score between 8-15:  moderate risk of alcohol related problems. Score between 16-19:  high risk of alcohol related problems. Score 20 or above:  warrants further diagnostic evaluation for alcohol dependence and treatment.   CLINICAL FACTORS:   Severe Anxiety and/or Agitation Depression:   Comorbid alcohol abuse/dependence Alcohol/Substance Abuse/Dependencies Chronic Pain   Musculoskeletal: Strength & Muscle Tone: within normal limits Gait & Station: normal Patient leans: N/A  Psychiatric Specialty Exam: Physical Exam  Nursing note and vitals reviewed. Constitutional: He is oriented to person, place, and time. He appears well-developed and well-nourished. No distress.  Musculoskeletal: Normal range of motion.  Neurological: He is alert and oriented to person, place, and time.  Unsteady on feet.  Psychiatric:  Lethargic     Review of Systems  Constitutional: Negative.   Respiratory: Negative.   Cardiovascular: Negative.   Gastrointestinal: Negative.   Neurological: Positive for seizures.  Psychiatric/Behavioral: Positive for depression, hallucinations, substance abuse and suicidal ideas. The patient is nervous/anxious and has insomnia.     Blood pressure (!) 118/54, pulse 85, temperature 98.6 F (37 C), temperature source Oral, resp. rate 16,  height 5\' 7"  (1.702 m), weight 95.3 kg (210 lb).Body mass index is 32.89 kg/m.  General Appearance: Disheveled  Eye Contact:  Minimal  Speech:  Garbled  Volume:  Decreased  Mood:  Anxious and Dysphoric  Affect:  Congruent  Thought Process:  Linear and Descriptions of Associations: Intact  Orientation:  Full (Time, Place, and Person)  Thought Content:  Logical and Hallucinations: None  Suicidal Thoughts:  No  Homicidal Thoughts:  No  Memory:  Immediate;   Fair Recent;   Fair Remote;   Fair  Judgement:  Fair  Insight:  Lacking  Psychomotor Activity:  Normal  Concentration:  Concentration: Fair and Attention Span: Fair  Recall:  [employer]Fair  Fund of Knowledge:  Fair  Language:  Fair  Akathisia:  No  AIMS (if indicated):     Assets:  Housing Social Support  ADL's:  Intact  Cognition:  WNL  Sleep:  Number of Hours: 4.75   COGNITIVE FEATURES THAT CONTRIBUTE TO RISK:  None    SUICIDE RISK:   Mild:  Suicidal ideation of limited frequency, intensity, duration, and specificity.  There are no identifiable plans, no associated intent, mild dysphoria and related symptoms, good self-control (both objective and subjective assessment), few other risk factors, and identifiable protective factors, including available and accessible social support.  PLAN OF CARE:  Treatment Plan Summary: Daily contact with patient to assess and evaluate symptoms and progress in treatment  and Medication management  Observation Level/Precautions:  Continuous Observation Seizure fall risk  Laboratory:  CBC Chemistry Profile HbAIC UDS Lipid  Psychotherapy:  Attend groups  Medications:  CIWA protocol; haldol; Cymbalta  Consultations:  N/A  Discharge Concerns:  Need for substance abuse vs. Pain management  Estimated LOS: 3-5 days  Other:  HTN, chronic pain   Physician Treatment Plan for Primary Diagnosis: Major depressive disorder, recurrent, severe with psychotic features (HCC) Long Term Goal(s):  Improvement in symptoms so as ready for discharge  Short Term Goals: Ability to identify changes in lifestyle to reduce recurrence of condition will improve, Compliance with prescribed medications will improve and Ability to identify triggers associated with substance abuse/mental health issues will improve  Physician Treatment Plan for Secondary Diagnosis: Principal Problem:   Major depressive disorder, recurrent, severe with psychotic features (HCC) Active Problems:   Opiate dependence (HCC)   Benzodiazepine abuse, episodic (HCC)   Delirium due to another medical condition  Long Term Goal(s): Improvement in symptoms so as ready for discharge  Short Term Goals: Ability to identify changes in lifestyle to reduce recurrence of condition will improve, Compliance with prescribed medications will improve and Ability to identify triggers associated with substance abuse/mental health issues will improve   I certify that inpatient services furnished can reasonably be expected to improve the patient's condition.   Walter Craft, MD 09/28/2017, 6:18 PM

## 2017-09-28 NOTE — Progress Notes (Signed)
1:1 DAR note: D:  Walter Holder continues to be resting with his eyes closed.  Breathing is even and unlabored.  Sitter remains by his side. A:  1:1 sitter remains for safety.  R:  Walter Holder remains safe on the unit.

## 2017-09-28 NOTE — H&P (Signed)
Psychiatric Admission Assessment Adult  Patient Identification: Walter Holder MRN:  [id number] Date of Evaluation:  09/28/2017 Chief Complaint:  brief psychotic disorder Principal Diagnosis: Major depressive disorder, recurrent, severe with psychotic features ([place]) Diagnosis:   Patient Active Problem List   Diagnosis Date Noted  . Delirium due to another medical condition [F05] 09/28/2017  . Benign prostatic hyperplasia [N40.0] 08/22/2014  . Abscess and cellulitis [L03.90, L02.91] 08/14/2014  . Cellulitis of right lower extremity [L03.115] 08/14/2014  . Recurrent major depression-severe ([place]) [F33.2] 03/29/2014  . Major depressive disorder, recurrent, severe with psychotic features ([place]) [F33.3] 03/28/2014  . Opiate dependence ([place]) [F11.20] 03/28/2014  . Benzodiazepine abuse, episodic ([place]) [F13.10] 03/28/2014  . Colitis due to Clostridium difficile [A04.72] 02/29/2012    Class: Acute  . Hypokalemia [E87.6] 02/29/2012    Class: Acute  . Low back pain [M54.5] 02/29/2012    Class: Chronic  . Acute infective gastroenteritis [A09] 02/28/2012  . Dehydration [E86.0] 02/28/2012  . Tachycardia [R00.0] 02/28/2012  . Heme positive stool [R19.5] 02/28/2012   History of Present Illness:  62 yo male brought to the ED by his family for AMS after a seizure, most likely caused by Xanax withdrawal (2 mg TID) which he must have obtained from the streets since the PMP indicates no refill history for Xanax (PMP does indicate recent prescriptions for Buprenorphine).  After the seizure, he was having hallucinations but denied on assessment this morning.  This afternoon he did tell this provider that he is hearing voices that I am coming toward him which is scaring him.  Psychosis in the past but none lately.  Noted to have a UTI.  Does admit to using many Xanax and feels his seizure was related to withdrawal symptoms.  Depressed and anxious, slow to respond in his room, thought blocking present, not sure  about how much cognitive processing is working at this time; I.e  Not sure he comprehends questions. The pt is not a good historian.  According to the pt's wife the pt has been having hallucinations.  The pt denies having any hallucinations.  He is currently living with his wife, son, and daughter.  He denies any problems at home.  The pt is able to state where he is and the president, but unable to give the year.  The pt states he sees a psychiatrist, but he is unable to remember the psychiatrist's name.  The pt denies current or past SI.  He also denies any self harm and HI.  He denies any legal issues.  He reports he sleeps and eats well.  He denies using any substances and his UDS is negative for all substances.  It is noted the pt has a UTI.  He is alert . Pt speaks in a clear tone, at moderate volume and normal pace. Eye contact is good. Pt's mood is pleasant. Thought process is blocked.  When asked simple questions, such as the year, the pt gave long pauses and then would eventually answer, "I don't know". There is no indication Pt is currently responding to internal stimuli or experiencing delusional thought content.?Pt was cooperative throughout assessment.       On initial assessment, patient reports that he has been on either subutex or suboxone from his pain doctor for his chronic back pain.  He states that it is very expensive, and he has not had it in a few days.  He states that he lives with his wife and 39 year old son, but that  he is the primary care giver for his wife.  He states that he is feeling oversedated on the medication and getting light headed.  He denies SI, HI stating his family needs him.  He denies AVH, delusions or paranoia.  He does not appear to be responding to internal stimuli.  He spends majority of day in bed. Reports a good appetite and states he is resting well.  Walter Holder has agreed to continue the current plan of care already in progress. He denies any other issues  or concerns. Support encouragement reassurance was provided. Patient signs for voluntary admission.  Associated Signs/Symptoms: Depression Symptoms:  loss of energy/fatigue, disturbed sleep, chronic pain (Hypo) Manic Symptoms:  Irritable Mood, Anxiety Symptoms:  Worry about wife being home alone Psychotic Symptoms:  Hallucinations: None PTSD Symptoms: NA Total Time spent with patient: 45 minutes  Past Psychiatric History: depression, substance abuse  Is the patient at risk to self? No.  Has the patient been a risk to self in the past 6 months? No.  Has the patient been a risk to self within the distant past? No.  Is the patient a risk to others? No.  Has the patient been a risk to others in the past 6 months? No.  Has the patient been a risk to others within the distant past? No.   Prior Inpatient Therapy:   Yes Prior Therapy Dates: 2016 Prior Therapy Facility/Provider(s): [facility]   Prior Outpatient Therapy:  yes  Alcohol Screening: 1. How often do you have a drink containing alcohol?: Monthly or less 2. How many drinks containing alcohol do you have on a typical day when you are drinking?: 1 or 2 3. How often do you have six or more drinks on one occasion?: Never AUDIT-C Score: 1 4. How often during the last year have you found that you were not able to stop drinking once you had started?: Never 5. How often during the last year have you failed to do what was normally expected from you becasue of drinking?: Never 6. How often during the last year have you needed a first drink in the morning to get yourself going after a heavy drinking session?: Never 7. How often during the last year have you had a feeling of guilt of remorse after drinking?: Never 8. How often during the last year have you been unable to remember what happened the night before because you had been drinking?: Never 9. Have you or someone else been injured as a result of your drinking?: No 10. Has a relative  or friend or a doctor or another health worker been concerned about your drinking or suggested you cut down?: No Alcohol Use Disorder Identification Test Final Score (AUDIT): 1 Intervention/Follow-up: AUDIT Score <7 follow-up not indicated Substance Abuse History in the last 12 months:  Yes.   Consequences of Substance Abuse: Medical Consequences:  hospital admission Previous Psychotropic Medications: Yes  Psychological Evaluations: Yes  Past Medical History:  Past Medical History:  Diagnosis Date  . Anginal pain ([place]) ~ 2007  . Arthritis    "might be getting some" (02/28/2012)  . C. difficile diarrhea ~ 2008; 02/28/2012  . Chronic lower back pain   . Complication of anesthesia    "my ears rang before I went out" (02/28/2012)  . GERD (gastroesophageal reflux disease)   . Stress at home    "from my daughter mostly" (02/28/2012)    Past Surgical History:  Procedure Laterality Date  . CARDIAC CATHETERIZATION  ~  2007  . MULTIPLE TOOTH EXTRACTIONS  1990's   "3" (02/28/2012)   Family History:  Family History  Problem Relation Age of Onset  . Alzheimer's disease Mother   . Diabetes Mellitus II Neg Hx    Family Psychiatric  History: dementia  Tobacco Screening: Have you used any form of tobacco in the last 30 days? (Cigarettes, Smokeless Tobacco, Cigars, and/or Pipes): No Social History:  Social History   Substance and Sexual Activity  Alcohol Use Not Currently   Comment: 02/28/2012 "5-6 beers on the weekends" 08/22/14 none.     Social History   Substance and Sexual Activity  Drug Use Yes  . Types: Marijuana, LSD, Benzodiazepines, Cocaine   Comment: 02/28/2012 "back in the 1970's"     Additional Social History: Marital status: Married Number of Years Married: 36 What types of issues is patient dealing with in the relationship?: pt reports no current issues in his marriage Are you sexually active?: No What is your sexual orientation?: heterosexual Has your sexual  activity been affected by drugs, alcohol, medication, or emotional stress?: na Does patient have children?: Yes How many children?: 2 How is patient's relationship with their children?: son age 56, daughter age 7.  Good relationship with son. Problems in relationship with daughter.                         Allergies:  No Known Allergies Lab Results:  Results for orders placed or performed during the hospital encounter of 09/26/17 (from the past 48 hour(s))  Comprehensive metabolic panel     Status: Abnormal   Collection Time: 09/27/17 12:03 AM  Result Value Ref Range   Sodium 145 135 - 145 mmol/L   Potassium 3.0 (L) 3.5 - 5.1 mmol/L   Chloride 108 98 - 111 mmol/L    Comment: Please note change in reference range.   CO2 27 22 - 32 mmol/L   Glucose, Bld 109 (H) 70 - 99 mg/dL    Comment: Please note change in reference range.   BUN 16 6 - 20 mg/dL    Comment: Please note change in reference range.   Creatinine, Ser 0.84 0.61 - 1.24 mg/dL   Calcium 9.2 8.9 - 10.3 mg/dL   Total Protein 7.7 6.5 - 8.1 g/dL   Albumin 4.0 3.5 - 5.0 g/dL   AST 29 15 - 41 U/L   ALT 24 0 - 44 U/L    Comment: Please note change in reference range.   Alkaline Phosphatase 66 38 - 126 U/L   Total Bilirubin 0.7 0.3 - 1.2 mg/dL   GFR calc non Af Amer >60 >60 mL/min   GFR calc Af Amer >60 >60 mL/min    Comment: (NOTE) The eGFR has been calculated using the CKD EPI equation. This calculation has not been validated in all clinical situations. eGFR's persistently <60 mL/min signify possible Chronic Kidney Disease.    Anion gap 10 5 - 15    Comment: Performed at [facility], [place] [address] [place], [place] [address]  CBC WITH DIFFERENTIAL     Status: Abnormal   Collection Time: 09/27/17 12:03 AM  Result Value Ref Range   WBC 10.7 (H) 4.0 - 10.5 K/uL   RBC 4.62 4.22 - 5.81 MIL/uL   Hemoglobin 13.5 13.0 - 17.0 g/dL   HCT 39.3 39.0 - 52.0 %   MCV 85.1 78.0 - 100.0 fL   MCH 29.2  26.0 - 34.0 pg  MCHC 34.4 30.0 - 36.0 g/dL   RDW 15.3 11.5 - 15.5 %   Platelets 222 150 - 400 K/uL   Neutrophils Relative % 73 %   Neutro Abs 7.8 (H) 1.7 - 7.7 K/uL   Lymphocytes Relative 20 %   Lymphs Abs 2.2 0.7 - 4.0 K/uL   Monocytes Relative 7 %   Monocytes Absolute 0.7 0.1 - 1.0 K/uL   Eosinophils Relative 0 %   Eosinophils Absolute 0.0 0.0 - 0.7 K/uL   Basophils Relative 0 %   Basophils Absolute 0.0 0.0 - 0.1 K/uL    Comment: Performed at [facility], [place] [address], [place], [place] [address]  Ammonia     Status: None   Collection Time: 09/27/17 12:03 AM  Result Value Ref Range   Ammonia 19 9 - 35 umol/L    Comment: Performed at [facility], [place] [address], [place], [place] [address]  Urinalysis, Routine w reflex microscopic     Status: Abnormal   Collection Time: 09/27/17 12:03 AM  Result Value Ref Range   Color, Urine YELLOW YELLOW   APPearance HAZY (A) CLEAR   Specific Gravity, Urine 1.011 1.005 - 1.030   pH 7.0 5.0 - 8.0   Glucose, UA NEGATIVE NEGATIVE mg/dL   Hgb urine dipstick SMALL (A) NEGATIVE   Bilirubin Urine NEGATIVE NEGATIVE   Ketones, ur 5 (A) NEGATIVE mg/dL   Protein, ur NEGATIVE NEGATIVE mg/dL   Nitrite NEGATIVE NEGATIVE   Leukocytes, UA MODERATE (A) NEGATIVE   RBC / HPF 6-10 0 - 5 RBC/hpf   WBC, UA 21-50 0 - 5 WBC/hpf   Bacteria, UA MANY (A) NONE SEEN   Squamous Epithelial / LPF 0-5 0 - 5   Mucus PRESENT    Hyaline Casts, UA PRESENT     Comment: Performed at [facility], [place] [address], [place], [place] [address]  Ethanol     Status: None   Collection Time: 09/27/17 12:03 AM  Result Value Ref Range   Alcohol, Ethyl (B) <10 <10 mg/dL    Comment: (NOTE) Lowest detectable limit for serum alcohol is 10 mg/dL. For medical purposes only. Performed at [facility], [place] [address] [place], [place] [address]   Urine rapid drug screen (hosp performed)     Status: Abnormal    Collection Time: 09/27/17 12:03 AM  Result Value Ref Range   Opiates NONE DETECTED NONE DETECTED   Cocaine NONE DETECTED NONE DETECTED   Benzodiazepines NONE DETECTED NONE DETECTED   Amphetamines NONE DETECTED NONE DETECTED   Tetrahydrocannabinol NONE DETECTED NONE DETECTED   Barbiturates (A) NONE DETECTED    Result not available. Reagent lot number recalled by manufacturer.    Comment: Performed at [facility], [place] [address] [place], [place] [address]  Acetaminophen level     Status: Abnormal   Collection Time: 09/27/17 12:03 AM  Result Value Ref Range   Acetaminophen (Tylenol), Serum <10 (L) 10 - 30 ug/mL    Comment: (NOTE) Therapeutic concentrations vary significantly. A range of 10-30 ug/mL  may be an effective concentration for many patients. However, some  are best treated at concentrations outside of this range. Acetaminophen concentrations >150 ug/mL at 4 hours after ingestion  and >50 ug/mL at 12 hours after ingestion are often associated with  toxic reactions. Performed at [facility], [place] [address], [place], [place] [address]   Salicylate level     Status: None  Collection Time: 09/27/17 12:03 AM  Result Value Ref Range   Salicylate Lvl <9.2 2.8 - 30.0 mg/dL    Comment: Performed at [facility], [place] [address], [place], [place] [address]  CK     Status: Abnormal   Collection Time: 09/27/17 12:03 AM  Result Value Ref Range   Total CK 602 (H) 49 - 397 U/L    Comment: Performed at [facility], [place] [address] [place], [place] [address]    Blood Alcohol level:  Lab Results  Component Value Date   ETH <10 09/27/2017   ETH <5 44/81/8563    Metabolic Disorder Labs:  Lab Results  Component Value Date   HGBA1C 5.4 04/01/2014   MPG 108 04/01/2014   No results found for: PROLACTIN Lab Results  Component Value Date   CHOL 187 04/01/2014   TRIG 225 (H) 04/01/2014   HDL 42  04/01/2014   CHOLHDL 4.5 04/01/2014   VLDL 45 (H) 04/01/2014   LDLCALC 100 (H) 04/01/2014   LDLCALC  03/12/2008    97        Total Cholesterol/HDL:CHD Risk Coronary Heart Disease Risk Table                     Men   Women  1/2 Average Risk   3.4   3.3    Current Medications: Current Facility-Administered Medications  Medication Dose Route Frequency Provider Last Rate Last Dose  . acetaminophen (TYLENOL) tablet 650 mg  650 mg Oral Q6H PRN Patrecia Pour, NP   650 mg at 09/28/17 0106  . alum & mag hydroxide-simeth (MAALOX/MYLANTA) 200-200-20 MG/5ML suspension 30 mL  30 mL Oral Q4H PRN Patrecia Pour, NP      . cephALEXin (KEFLEX) capsule 500 mg  500 mg Oral Q8H Patrecia Pour, NP   500 mg at 09/28/17 1524  . DULoxetine (CYMBALTA) DR capsule 30 mg  30 mg Oral QHS Patrecia Pour, NP   30 mg at 09/27/17 2148  . [START ON 09/29/2017] haloperidol (HALDOL) tablet 5 mg  5 mg Oral QHS Lavella Hammock, MD      . hydrOXYzine (ATARAX/VISTARIL) tablet 25 mg  25 mg Oral Q6H PRN Patrecia Pour, NP      . lisinopril (PRINIVIL,ZESTRIL) tablet 10 mg  10 mg Oral Daily Patrecia Pour, NP   10 mg at 09/28/17 1116  . loperamide (IMODIUM) capsule 2-4 mg  2-4 mg Oral PRN Patrecia Pour, NP      . LORazepam (ATIVAN) tablet 1 mg  1 mg Oral Q6H PRN Patrecia Pour, NP   1 mg at 09/28/17 0106  . LORazepam (ATIVAN) tablet 1 mg  1 mg Oral TID Patrecia Pour, NP       Followed by  . [START ON 09/29/2017] LORazepam (ATIVAN) tablet 1 mg  1 mg Oral BID Patrecia Pour, NP       Followed by  . [START ON 10/01/2017] LORazepam (ATIVAN) tablet 1 mg  1 mg Oral Daily Lord, Jamison Y, NP      . magnesium hydroxide (MILK OF MAGNESIA) suspension 30 mL  30 mL Oral Daily PRN Patrecia Pour, NP      . multivitamin with minerals tablet 1 tablet  1 tablet Oral Daily Patrecia Pour, NP   1 tablet at 09/28/17 1117  . ondansetron (ZOFRAN-ODT) disintegrating tablet 4 mg  4 mg Oral Q6H PRN Patrecia Pour, NP      .  thiamine  (VITAMIN B-1) tablet 100 mg  100 mg Oral Daily Patrecia Pour, NP   100 mg at 09/28/17 1117   PTA Medications: Medications Prior to Admission  Medication Sig Dispense Refill Last Dose  . DULoxetine (CYMBALTA) 30 MG capsule Take 30 mg by mouth at bedtime.   Past Week at Unknown time  . Melatonin 3 MG TABS Take 3-9 mg by mouth at bedtime as needed (SLEEP).   Past Week at Unknown time  . vitamin B-12 (CYANOCOBALAMIN) 1000 MCG tablet Take 1,000 mcg by mouth daily.   Past Week at Unknown time    Musculoskeletal: Strength & Muscle Tone: within normal limits Gait & Station: normal Patient leans: N/A  Psychiatric Specialty Exam: Physical Exam  Nursing note and vitals reviewed. Constitutional: He is oriented to person, place, and time. He appears well-developed and well-nourished. No distress.  Musculoskeletal: Normal range of motion.  Neurological: He is alert and oriented to person, place, and time.  Unsteady on feet.  Psychiatric:  Lethargic     Review of Systems  Constitutional: Negative.   Respiratory: Negative.   Cardiovascular: Negative.   Gastrointestinal: Negative.   Neurological: Positive for seizures.  Psychiatric/Behavioral: Positive for depression, hallucinations, substance abuse and suicidal ideas. The patient is nervous/anxious and has insomnia.     Blood pressure (!) 118/54, pulse 85, temperature 98.6 F (37 C), temperature source Oral, resp. rate 16, height 5' 7"  (1.702 m), weight 95.3 kg (210 lb).Body mass index is 32.89 kg/m.  General Appearance: Disheveled  Eye Contact:  Minimal  Speech:  Garbled  Volume:  Decreased  Mood:  Anxious and Dysphoric  Affect:  Congruent  Thought Process:  Linear and Descriptions of Associations: Intact  Orientation:  Full (Time, Place, and Person)  Thought Content:  Logical and Hallucinations: None  Suicidal Thoughts:  No  Homicidal Thoughts:  No  Memory:  Immediate;   Fair Recent;   Fair Remote;   Fair  Judgement:  Fair   Insight:  Lacking  Psychomotor Activity:  Normal  Concentration:  Concentration: Fair and Attention Span: Fair  Recall:  [employer] of Knowledge:  Fair  Language:  Fair  Akathisia:  No  AIMS (if indicated):     Assets:  Housing Social Support  ADL's:  Intact  Cognition:  WNL  Sleep:  Number of Hours: 4.75    Treatment Plan Summary: Daily contact with patient to assess and evaluate symptoms and progress in treatment and Medication management  Observation Level/Precautions:  Continuous Observation Seizure fall risk  Laboratory:  CBC Chemistry Profile HbAIC UDS Lipid  Psychotherapy:  Attend groups  Medications:  CIWA protocol; haldol; Cymbalta  Consultations:  N/A  Discharge Concerns:  Need for substance abuse vs. Pain management  Estimated LOS: 3-5 days  Other:  HTN, chronic pain   Physician Treatment Plan for Primary Diagnosis: Major depressive disorder, recurrent, severe with psychotic features ([place]) Long Term Goal(s): Improvement in symptoms so as ready for discharge  Short Term Goals: Ability to identify changes in lifestyle to reduce recurrence of condition will improve, Compliance with prescribed medications will improve and Ability to identify triggers associated with substance abuse/mental health issues will improve  Physician Treatment Plan for Secondary Diagnosis: Principal Problem:   Major depressive disorder, recurrent, severe with psychotic features ([place]) Active Problems:   Opiate dependence ([place])   Benzodiazepine abuse, episodic ([place])   Delirium due to another medical condition  Long Term Goal(s): Improvement in symptoms so as ready for discharge  Short Term Goals: Ability to identify changes in lifestyle to reduce recurrence of condition will improve, Compliance with prescribed medications will improve and Ability to identify triggers associated with substance abuse/mental health issues will improve  I certify that inpatient services furnished can  reasonably be expected to improve the patient's condition.    Lavella Hammock, MD 7/11/20195:59 PM

## 2017-09-28 NOTE — Progress Notes (Signed)
Patient has been isolative to room.  Patient is asleep 1:1 sitter in close proximity to patient.  Continue to monitor as planned.  
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
## 2017-09-28 NOTE — Progress Notes (Signed)
Recreation Therapy Notes  Date: 7.11.19 Time: 1000 Location: 500 Hall Dayroom  Group Topic: Communication, Team Building, Problem Solving  Goal Area(s) Addresses:  Patient will effectively work with peer towards shared goal.  Patient will identify skill used to make activity successful.  Patient will identify how skills used during activity can be used to reach post d/c goals.   Intervention: STEM Activity   Activity: In team's, using 10 red plastic cups, patients were asked to build pyramid.  One of the cups will have a rubber band with 4 strings attached to it.  This cup will be used as the main tool to stack the rest of the cups.    Education: Pharmacist, communityocial Skills, Building control surveyorDischarge Planning.   Education Outcome: Acknowledges education/In group clarification offered/Needs additional education.   Clinical Observations/Feedback: Pt did not attend group.    Caroll RancherMarjette Corona Popovich, LRT/CTRS         Caroll RancherLindsay, Aulton Routt A 09/28/2017 11:54 AM

## 2017-09-28 NOTE — Progress Notes (Signed)
Patient has been isolative to room.  Patient is asleep 1:1 sitter in close proximity to patient.  Continue to monitor as planned.

## 2017-09-29 MED ORDER — PSEUDOEPHEDRINE HCL 30 MG PO TABS
30.0000 mg | ORAL_TABLET | Freq: Four times a day (QID) | ORAL | Status: DC | PRN
  Filled 2017-09-29: qty 1

## 2017-09-29 NOTE — Progress Notes (Signed)
Post 1:1 note 3/4   Pt continues to be compliant with treatment plan and medication administration. Pt denies any si/hi/ah/vh and verbally agrees to approach staff if these become apparent. Pt in bed now resting. Pt doesn't seem in distress by a relaxed body, respirations unlabored and equal bilaterally.  q682m safety checks implemented and continued. Will continue to monitor.

## 2017-09-29 NOTE — Progress Notes (Signed)
Post 1:1 note 2/4  Pt is pleasant and compliant with medications. Pt attended group and was interactive with his peers. Pt continues to deny si/hi/ah/vh. Pt safe on the unit. q282m safety checks implemented and continued. Will continue to monitor.

## 2017-09-29 NOTE — [facility] Suicide Risk Assessment (Signed)
[facility] INPATIENT:  Family/Significant Other Suicide Prevention Education  Suicide Prevention Education:  Contact Attempts: Walter Holder, wife, [phone], has been identified by the patient as the family member/significant other with whom the patient will be residing, and identified as the person(s) who will aid the patient in the event of a mental health crisis.  With written consent from the patient, two attempts were made to provide suicide prevention education, prior to and/or following the patient's discharge.  We were unsuccessful in providing suicide prevention education.  A suicide education pamphlet was given to the patient to share with family/significant other.  Date and time of first attempt:09/29/17, 0908 Date and time of second attempt:  [name], Walter Stiefel Jon, LCSW 09/29/2017, 9:08 AM

## 2017-09-29 NOTE — Progress Notes (Signed)
Post 1:1 observation note 1/4  Pt has been in bed most of the morning. Pt pleasant and compliant with medication administration. Pt denies any physical pain. Pt has no requests at this time. q2663m safety checks implemented and continued. Pt denies si/hi/ah/vh and verbally agrees to approach staff if these become apparent.

## 2017-09-29 NOTE — Tx Team (Signed)
Interdisciplinary Treatment and Diagnostic Plan Update  09/29/2017 Time of Session: 8:58 AM  Walter Holder MRN: [id number]  Principal Diagnosis: Major depressive disorder, recurrent, severe with psychotic features ([place])  Secondary Diagnoses: Principal Problem:   Major depressive disorder, recurrent, severe with psychotic features ([place]) Active Problems:   Opiate dependence ([place])   Benzodiazepine abuse, episodic ([place])   Delirium due to another medical condition   Current Medications:  Current Facility-Administered Medications  Medication Dose Route Frequency Provider Last Rate Last Dose  . acetaminophen (TYLENOL) tablet 650 mg  650 mg Oral Q6H PRN Patrecia Pour, NP   650 mg at 09/28/17 0106  . alum & mag hydroxide-simeth (MAALOX/MYLANTA) 200-200-20 MG/5ML suspension 30 mL  30 mL Oral Q4H PRN Patrecia Pour, NP      . cephALEXin (KEFLEX) capsule 500 mg  500 mg Oral Q8H Patrecia Pour, NP   500 mg at 09/29/17 0803  . DULoxetine (CYMBALTA) DR capsule 30 mg  30 mg Oral QHS Patrecia Pour, NP   30 mg at 09/28/17 2312  . haloperidol (HALDOL) tablet 5 mg  5 mg Oral QHS Lavella Hammock, MD      . hydrOXYzine (ATARAX/VISTARIL) tablet 25 mg  25 mg Oral Q6H PRN Patrecia Pour, NP      . lisinopril (PRINIVIL,ZESTRIL) tablet 10 mg  10 mg Oral Daily Patrecia Pour, NP   10 mg at 09/29/17 0803  . loperamide (IMODIUM) capsule 2-4 mg  2-4 mg Oral PRN Patrecia Pour, NP      . LORazepam (ATIVAN) tablet 1 mg  1 mg Oral Q6H PRN Patrecia Pour, NP   1 mg at 09/28/17 2312  . LORazepam (ATIVAN) tablet 1 mg  1 mg Oral TID Patrecia Pour, NP   1 mg at 09/29/17 0803   Followed by  . LORazepam (ATIVAN) tablet 1 mg  1 mg Oral BID Patrecia Pour, NP       Followed by  . [START ON 10/01/2017] LORazepam (ATIVAN) tablet 1 mg  1 mg Oral Daily Lord, Jamison Y, NP      . magnesium hydroxide (MILK OF MAGNESIA) suspension 30 mL  30 mL Oral Daily PRN Patrecia Pour, NP      . multivitamin with minerals tablet  1 tablet  1 tablet Oral Daily Patrecia Pour, NP   1 tablet at 09/29/17 0803  . ondansetron (ZOFRAN-ODT) disintegrating tablet 4 mg  4 mg Oral Q6H PRN Patrecia Pour, NP      . thiamine (VITAMIN B-1) tablet 100 mg  100 mg Oral Daily Patrecia Pour, NP   100 mg at 09/29/17 0803    PTA Medications: Medications Prior to Admission  Medication Sig Dispense Refill Last Dose  . DULoxetine (CYMBALTA) 30 MG capsule Take 30 mg by mouth at bedtime.   Past Week at Unknown time  . Melatonin 3 MG TABS Take 3-9 mg by mouth at bedtime as needed (SLEEP).   Past Week at Unknown time  . vitamin B-12 (CYANOCOBALAMIN) 1000 MCG tablet Take 1,000 mcg by mouth daily.   Past Week at Unknown time    Patient Stressors: Health problems  Patient Strengths: Ability for insight Average or above average intelligence Supportive family/friends  Treatment Modalities: Medication Management, Group therapy, Case management,  1 to 1 session with clinician, Psychoeducation, Recreational therapy.   Physician Treatment Plan for Primary Diagnosis: Major depressive disorder, recurrent, severe with psychotic features ([place]) Long Term Goal(s):  Improvement in symptoms so as ready for discharge  Short Term Goals: Ability to identify changes in lifestyle to reduce recurrence of condition will improve Compliance with prescribed medications will improve Ability to identify triggers associated with substance abuse/mental health issues will improve Ability to identify changes in lifestyle to reduce recurrence of condition will improve Compliance with prescribed medications will improve Ability to identify triggers associated with substance abuse/mental health issues will improve  Medication Management: Evaluate patient's response, side effects, and tolerance of medication regimen.  Therapeutic Interventions: 1 to 1 sessions, Unit Group sessions and Medication administration.  Evaluation of Outcomes: Progressing  Physician  Treatment Plan for Secondary Diagnosis: Principal Problem:   Major depressive disorder, recurrent, severe with psychotic features ([place]) Active Problems:   Opiate dependence ([place])   Benzodiazepine abuse, episodic ([place])   Delirium due to another medical condition   Long Term Goal(s): Improvement in symptoms so as ready for discharge  Short Term Goals: Ability to identify changes in lifestyle to reduce recurrence of condition will improve Compliance with prescribed medications will improve Ability to identify triggers associated with substance abuse/mental health issues will improve Ability to identify changes in lifestyle to reduce recurrence of condition will improve Compliance with prescribed medications will improve Ability to identify triggers associated with substance abuse/mental health issues will improve  Medication Management: Evaluate patient's response, side effects, and tolerance of medication regimen.  Therapeutic Interventions: 1 to 1 sessions, Unit Group sessions and Medication administration.  Evaluation of Outcomes: Progressing   RN Treatment Plan for Primary Diagnosis: Major depressive disorder, recurrent, severe with psychotic features ([place]) Long Term Goal(s): Knowledge of disease and therapeutic regimen to maintain health will improve  Short Term Goals: Ability to identify and develop effective coping behaviors will improve and Compliance with prescribed medications will improve  Medication Management: RN will administer medications as ordered by provider, will assess and evaluate patient's response and provide education to patient for prescribed medication. RN will report any adverse and/or side effects to prescribing provider.  Therapeutic Interventions: 1 on 1 counseling sessions, Psychoeducation, Medication administration, Evaluate responses to treatment, Monitor vital signs and CBGs as ordered, Perform/monitor CIWA, COWS, AIMS and Fall Risk screenings as ordered,  Perform wound care treatments as ordered.  Evaluation of Outcomes: Progressing   LCSW Treatment Plan for Primary Diagnosis: Major depressive disorder, recurrent, severe with psychotic features ([place]) Long Term Goal(s): Safe transition to appropriate next level of care at discharge, Engage patient in therapeutic group addressing interpersonal concerns.  Short Term Goals: Engage patient in aftercare planning with referrals and resources  Therapeutic Interventions: Assess for all discharge needs, 1 to 1 time with Social worker, Explore available resources and support systems, Assess for adequacy in community support network, Educate family and significant other(s) on suicide prevention, Complete Psychosocial Assessment, Interpersonal group therapy.  Evaluation of Outcomes: Met  Return home, and follow up outpt   Progress in Treatment: Attending groups: No Participating in groups: No Taking medication as prescribed: Yes Toleration medication: Yes, no side effects reported at this time Family/Significant other contact made: Yes Patient understands diagnosis: Yes AEB asking for help with psychosis. Discussing patient identified problems/goals with staff: Yes Medical problems stabilized or resolved: Yes Denies suicidal/homicidal ideation: Yes Issues/concerns per patient self-inventory: None Other: N/A  New problem(s) identified: None identified at this time.   New Short Term/Long Term Goal(s): "I want to get better so I can go home and see my people."   Discharge Plan or Barriers:   Reason for  Continuation of Hospitalization: Altered mental status Disorganization Medication stabilization   Estimated Length of Stay: 7/17  Attendees: Patient: Walter Holder 09/29/2017  8:58 AM  Physician: Melba Coon, MD 09/29/2017  8:58 AM  Nursing: Sena Hitch, RN 09/29/2017  8:58 AM  RN Care Manager: Lars Pinks, RN 09/29/2017  8:58 AM  Social Worker: Ripley Fraise 09/29/2017  8:58 AM   Recreational Therapist: Winfield Cunas 09/29/2017  8:58 AM  Other: Norberto Sorenson 09/29/2017  8:58 AM  Other:  09/29/2017  8:58 AM    Scribe for Treatment Team:  Roque Lias LCSW 09/29/2017 8:58 AM

## 2017-09-29 NOTE — B[facility] Group Notes (Signed)
Adult Psychoeducational Group Note  Date:  09/29/2017 Time:  9:02 PM  Group Topic/Focus:  Wrap-Up Group:   The focus of this group is to help patients review their daily goal of treatment and discuss progress on daily workbooks.  Participation Level:  Active  Participation Quality:  Appropriate  Affect:  Appropriate  Cognitive:  Appropriate  Insight: Improving  Engagement in Group:  Developing/Improving  Modes of Intervention:  Discussion  Additional Comments:  Patient shared that he is wanting to go home as soon as possible. Patient stated that he had a good day, eating and relaxing. Patient wants to work on himself- "straighting myself out."  Lyndee HensenGoins, Taysha Majewski R 09/29/2017, 9:02 PM

## 2017-09-29 NOTE — BHH Counselor (Signed)
Adult Comprehensive Assessment  Patient ID: Walter Holder, male   DOB: 01/07/1956, 62 y.o.   MRN: [id number]  Information Source: Information source: Patient  Current Stressors:  Patient states their primary concerns and needs for treatment are:: "I'm not sure" Patient states their goals for this hospitilization and ongoing recovery are:: I would like to go home. Family Relationships: Pt reports wife is in a wheel chair and requires care. Financial / Lack of resources (include bankruptcy): Pt reports financial stress, "we can't get caught up" Physical health (include injuries & life threatening diseases): Pt has back problems.  Pt reports trouble sleeping lately.  Living/Environment/Situation:  Living Arrangements: Spouse/significant other, Children(son) Living conditions (as described by patient or guardian): goes fine.  Safe Who else lives in the home?: none How long has patient lived in current situation?: 4-5 years What is atmosphere in current home: Comfortable  Family History:  Marital status: Married Number of Years Married: 36 What types of issues is patient dealing with in the relationship?: pt reports no current issues in his marriage Are you sexually active?: No What is your sexual orientation?: heterosexual Has your sexual activity been affected by drugs, alcohol, medication, or emotional stress?: na Does patient have children?: Yes How many children?: 2 How is patient's relationship with their children?: son age 60, daughter age 60.  Good relationship with son. Problems in relationship with daughter.  Childhood History:  By whom was/is the patient raised?: Mother, Grandparents Additional childhood history information: my father left when I was 6. he was an alcoholic. my mom and grandma primarily raised me. Dad rarely came around Description of patient's relationship with caregiver when they were a child: close to mother/grandmother. strained with father Patient's  description of current relationship with people who raised him/her: mom: dementia, in a facility, little contact, dad: deceased How were you disciplined when you got in trouble as a child/adolescent?: mom used "pretty rough" physical discipline Does patient have siblings?: Yes Number of Siblings: 2 Description of patient's current relationship with siblings: brother and sister.  Sister deceased.  Brother in [place]: not much contact. Did patient suffer any verbal/emotional/physical/sexual abuse as a child?: No Did patient suffer from severe childhood neglect?: No Has patient ever been sexually abused/assaulted/raped as an adolescent or adult?: No Was the patient ever a victim of a crime or a disaster?: No Witnessed domestic violence?: No Has patient been effected by domestic violence as an adult?: No  Education:  Highest grade of school patient has completed: 9th grade Currently a student?: No Learning disability?: No  Employment/Work Situation:   Employment situation: On disability Why is patient on disability: back fracture How long has patient been on disability: 2-3 years Patient's job has been impacted by current illness: (na) What is the longest time patient has a held a job?: 19 years 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  Xanax: occasional use, buys them off the street If attempted suicide, did drugs/alcohol play a role  in this?: No  Alcohol/Substance Abuse Treatment Hx: Denies past history Has alcohol/substance abuse ever caused legal problems?: No  Social Support System:   Patient's Community Support System: Fair Museum/gallery exhibitions officer System: wife, son Type of faith/religion: none How does patient's faith help to cope with current illness?: na  Leisure/Recreation:   Leisure and Hobbies: 'anything outdoors: fishing  Strengths/Needs:   What is the patient's perception of their strengths?: helping wife and family members Patient states they can use these personal strengths during their treatment to contribute to their recovery: Pt unable to answer Patient states these barriers may affect/interfere with their treatment: none Patient states these barriers may affect their return to the community: none Other important information patient would like considered in planning for their treatment: none  Discharge Plan:   Currently receiving community mental health services: No Patient states concerns and preferences for aftercare planning are: Willing to see a psychiatrist Patient states they will know when they are safe and ready for discharge when: when I stop seeing things Does patient have access to transportation?: Yes Does patient have financial barriers related to discharge medications?: No Will patient be returning to same living situation after discharge?: Yes  Summary/Recommendations:   Summary and Recommendations (to be completed by the evaluator): Pt is 62 year old male from [place].  Pt is diagnosed with major depressive disorder with psychotic features and was admitted due to altered mental status and possible psychosis.  Recommendations for pt include crisis stabilization, therapeutic milieu, attend and participate in groups, medicaiton management, and development of comprehensive mental wellness plan.  Lorri Frederick. 09/29/2017

## 2017-09-29 NOTE — BHH Suicide Risk Assessment (Signed)
B[facility] INPATIENT:  Family/Significant Other Suicide Prevention Education  Suicide Prevention Education:  Education Completed; Jose PersiaRoseMarie Alguire, wife, [phone], has been identified by the patient as the family member/significant other with whom the patient will be residing, and identified as the person(s) who will aid the patient in the event of a mental health crisis (suicidal ideations/suicide attempt).  With written consent from the patient, the family member/significant other has been provided the following suicide prevention education, prior to the and/or following the discharge of the patient.  The suicide prevention education provided includes the following:  Suicide risk factors  Suicide prevention and interventions  National Suicide Hotline telephone number  [facility] assessment telephone number  [facility] Emergency Assistance 911  [facility] and/or Residential Mobile Crisis Unit telephone number  Request made of family/significant other to:  Remove weapons (e.g., guns, rifles, knives), all items previously/currently identified as safety concern.  There are hunting guns.  Jaci Carrelose Marie said she will get them off premesis until she is sure pt is OK.  Remove drugs/medications (over-the-counter, prescriptions, illicit drugs), all items previously/currently identified as a safety concern.  The family member/significant other verbalizes understanding of the suicide prevention education information provided.  The family member/significant other agrees to remove the items of safety concern listed above.  Wife reports pt has been "pretty stressed'":  Caring for 60 year old granddaughter with diabetes, death of younger sister and father in the past year.  He was hallucinating after his siezure: worried that someone was going to hurt a little boy in the neighborhood, worried that someone was going to kill him.  CSW asked about xanax: wife said pt "has always been on  something like that"  She said he has had several different MDs prescribe this and she is not sure who the current MD is.  [name], Kairav Russomanno Jon, LCSW 09/29/2017, 11:26 AM

## 2017-09-29 NOTE — Progress Notes (Signed)
Recreation Therapy Notes  INPATIENT RECREATION THERAPY ASSESSMENT  Patient Details Name: Walter SonsDavid B Rhines MRN: [id number] DOB: 04/13/1955 Today's Date: 09/29/2017       Information Obtained From: Patient  Able to Participate in Assessment/Interview: Yes  Patient Presentation: Alert, Oriented  Reason for Admission (Per Patient): Other (Comments)("I don't know, the fireman brought me")  Patient Stressors: Other (Comment)(Daughter and grandbaby moved out)  Coping Skills:   Sports, Arguments, Music, Talk, Art, Prayer, Avoidance, Read, Hot Bath/Shower  Leisure Interests (2+):  ConocoPhillipsature - Fishing, Technical brewerature - [employer] MutualHunting  Frequency of Recreation/Participation: Other (Comment)(Every few months)  Awareness of Community Resources:  Yes  Community Resources:  Park, Other (Comment)([place])  Current Use: Yes  If no, Barriers?:    Expressed Interest in State Street CorporationCommunity Resource Information: No  [place]  [place]  Patient Main Form of Transportation: Set designerCar  Patient Strengths:  ArchivistGood grandpa, play guitar, good father and husband  Patient Identified Areas of Improvement:  "Keeping tighter leash on youngins"  Patient Goal for Hospitalization:  "Get better"  Current SI (including self-harm):  No  Current HI:  No  Current AVH: No  Staff Intervention Plan: Group Attendance, Collaborate with Interdisciplinary Treatment Team  Consent to Intern Participation: N/A   Caroll RancherMarjette Mida Cory, LRT/CTRS   Lillia AbedLindsay, Sundance Moise A 09/29/2017, 1:48 PM

## 2017-09-29 NOTE — [facility] Group Notes (Signed)
LCSW Group Therapy Note  09/29/2017 1:15pm  Type of Therapy/Topic:  Group Therapy:  Feelings about Diagnosis  Participation Level:  Minimal   Description of Group:   This group will allow patients to explore their thoughts and feelings about diagnoses they have received. Patients will be guided to explore their level of understanding and acceptance of these diagnoses. Facilitator will encourage patients to process their thoughts and feelings about the reactions of others to their diagnosis and will guide patients in identifying ways to discuss their diagnosis with significant others in their lives. This group will be process-oriented, with patients participating in exploration of their own experiences, giving and receiving support, and processing challenge from other group members.   Therapeutic Goals: 1. Patient will demonstrate understanding of diagnosis as evidenced by identifying two or more symptoms of the disorder 2. Patient will be able to express two feelings regarding the diagnosis 3. Patient will demonstrate their ability to communicate their needs through discussion and/or role play  Summary of Patient Progress:  Came late.  Mood good.  Was willing to engage in dialogue with other group members.     Therapeutic Modalities:   Cognitive Behavioral Therapy Brief Therapy Feelings Identification    Ida RogueRodney B Pakou Rainbow, LCSW 09/29/2017 4:26 PM

## 2017-09-29 NOTE — Progress Notes (Signed)
Recreation Therapy Notes  Date: 7.12.19 Time: 1000 Location: 500 Hall Dayroom  Group Topic: Leisure Education, Goal Setting  Goal Area(s) Addresses:  Patient will be able to identify at least 3 goals for leisure participation.  Patient will be able to identify benefit of investing in leisure participation.  Patient will be able to identify benefit of setting leisure goals.   Intervention: Worksheet  Activity: Garment/textile technologistGoal Planning.  Patients were to set goals for the next week, month, year and next five years.  Patients were to then identify obstacles, what they need to achieve their goals and what they can start doing tomorrow to work towards their goals.  Education:  Discharge Planning, PharmacologistCoping Skills, Leisure Education   Education Outcome: Acknowledges Education/In Group Clarification Provided/Needs Additional Education  Clinical Observations: Pt did not attend group.    Caroll RancherMarjette Sherrell Farish, LRT/CTRS         Caroll RancherLindsay, Myrl Lazarus A 09/29/2017 12:23 PM

## 2017-09-29 NOTE — Progress Notes (Signed)
Post 1:1 note 4/4  Pt viewed in the dayroom interacting with peers and watching television. Pt viewed as not in distress and pleasant. Pt denies si/hi/ah/vh and verbally agrees to approach staff if these become apparent. [id number] safety checks implemented and continued. Pt safe on the unit. Will continue to monitor.

## 2017-09-29 NOTE — Progress Notes (Signed)
1:1 Progress Note D: Pt currently asleep in bed. Patient appropriate to situation. Pt in no current distress.  A: Sitter is currently sitting at bedside. R: Pt remains safe on a 1:1 per MD orders.  

## 2017-09-29 NOTE — Progress Notes (Signed)
[facility] MD Progress Note  09/29/2017 11:27 AM Walter Holder  MRN:  [id number]   Subjective:  "I am feeling better today.  My daughter is staying with my wife."  Principal Problem: Major depressive disorder, recurrent, severe with psychotic features ([place]) Diagnosis:   Patient Active Problem List   Diagnosis Date Noted  . Delirium due to another medical condition [F05] 09/28/2017  . Benign prostatic hyperplasia [N40.0] 08/22/2014  . Abscess and cellulitis [L03.90, L02.91] 08/14/2014  . Cellulitis of right lower extremity [L03.115] 08/14/2014  . Recurrent major depression-severe ([place]) [F33.2] 03/29/2014  . Major depressive disorder, recurrent, severe with psychotic features ([place]) [F33.3] 03/28/2014  . Opiate dependence ([place]) [F11.20] 03/28/2014  . Benzodiazepine abuse, episodic ([place]) [F13.10] 03/28/2014  . Colitis due to Clostridium difficile [A04.72] 02/29/2012    Class: Acute  . Hypokalemia [E87.6] 02/29/2012    Class: Acute  . Low back pain [M54.5] 02/29/2012    Class: Chronic  . Acute infective gastroenteritis [A09] 02/28/2012  . Dehydration [E86.0] 02/28/2012  . Tachycardia [R00.0] 02/28/2012  . Heme positive stool [R19.5] 02/28/2012   Total Time spent with patient: 35 minutes History of Present Illness: 62 yo male brought to the ED by his family for AMS after a seizure, most likely caused by Xanax withdrawal (2 mg TID)which he must have obtained from the streets since the PMP indicates no refill history for Xanax (PMP does indicate recent prescriptions for Buprenorphine). After the seizure, he was having hallucinations but denied on assessment this morning. This afternoon he did tell this provider that he is hearing voices that I am coming toward him which is scaring him. Psychosis in the past but none lately. Noted to have a UTI. Does admit to using many Xanax and feels his seizure was related to withdrawal symptoms. Depressed and anxious, slow to respond in his room, thought  blocking present, not sure about how much cognitive processing is working at this time; I.e Not sure he comprehends questions. The pt is not a good historian.  According to the pt's wife the pt has been having hallucinations. The pt denies having any hallucinations. He is currently living with his wife, son, and daughter. He denies any problems at home. The pt is able to state where he is and the president, but unable to give the year. The pt states he sees a psychiatrist, but he is unable to remember the psychiatrist's name. The pt denies current or past SI. He also denies any self harm and HI. He denies any legal issues. He reports he sleeps and eats well. He denies using any substances and his UDS is negative for all substances. It is noted the pt has a UTI.  He is alert . Pt speaks in a clear tone, at moderate volume and normal pace. Eye contact is good. Pt's mood ispleasant. Thought process isblocked.When asked simple questions, such as the year, the pt gave long pauses and then would eventually answer, "I don't know".There is no indication Pt is currently responding to internal stimuli or experiencing delusional thought content.?Pt was cooperative throughout assessment.    Walter Holder is seen, chart reviewed. The chart findings discussed with the treatment team.  On assessment, patient reports that he is feeling better.  He complains of some ear congestion and chronic back pain. He has not met criteria for CIWA or COWS protocol.  He states that his daughter has come to care for his wife.  He has some worry because his daughter smokes in  bed and he is afraid that she falls asleep while smoking as evidenced by burn marks on her pillow.  He denies SI, HI stating his family needs him.  He denies AVH, delusions or paranoia.  He does not appear to be responding to internal stimuli.  He spends majority of day in bed. Reports a good appetite and states he is resting well.  Walter Richer Hutsonhas  agreed to continue the current plan of care already in progress. He denies any other issues or concerns. Support encouragement reassurance was provided.  Past Medical History:  Past Medical History:  Diagnosis Date  . Anginal pain ([place]) ~ 2007  . Arthritis    "might be getting some" (02/28/2012)  . C. difficile diarrhea ~ 2008; 02/28/2012  . Chronic lower back pain   . Complication of anesthesia    "my ears rang before I went out" (02/28/2012)  . GERD (gastroesophageal reflux disease)   . Stress at home    "from my daughter mostly" (02/28/2012)    Past Surgical History:  Procedure Laterality Date  . CARDIAC CATHETERIZATION  ~ 2007  . MULTIPLE TOOTH EXTRACTIONS  1990's   "3" (02/28/2012)   Family History:  Family History  Problem Relation Age of Onset  . Alzheimer's disease Mother   . Diabetes Mellitus II Neg Hx    Family Psychiatric  History: see H&P Social History:  Social History   Substance and Sexual Activity  Alcohol Use Not Currently   Comment: 02/28/2012 "5-6 beers on the weekends" 08/22/14 none.     Social History   Substance and Sexual Activity  Drug Use Yes  . Types: Marijuana, LSD, Benzodiazepines, Cocaine   Comment: 02/28/2012 "back in the 1970's"     Social History   Socioeconomic History  . Marital status: Married    Spouse name: Not on file  . Number of children: Not on file  . Years of education: Not on file  . Highest education level: Not on file  Occupational History  . Not on file  Social Needs  . Financial resource strain: Not on file  . Food insecurity:    Worry: Not on file    Inability: Not on file  . Transportation needs:    Medical: Not on file    Non-medical: Not on file  Tobacco Use  . Smoking status: Never Smoker  . Smokeless tobacco: Never Used  Substance and Sexual Activity  . Alcohol use: Not Currently    Comment: 02/28/2012 "5-6 beers on the weekends" 08/22/14 none.  . Drug use: Yes    Types: Marijuana, LSD,  Benzodiazepines, Cocaine    Comment: 02/28/2012 "back in the 1970's"   . Sexual activity: Yes  Lifestyle  . Physical activity:    Days per week: Not on file    Minutes per session: Not on file  . Stress: Not on file  Relationships  . Social connections:    Talks on phone: Not on file    Gets together: Not on file    Attends religious service: Not on file    Active member of club or organization: Not on file    Attends meetings of clubs or organizations: Not on file    Relationship status: Not on file  Other Topics Concern  . Not on file  Social History Narrative  . Not on file   Additional Social History:     See H&P  Sleep: Good  Appetite:  Good  Current Medications: Current Facility-Administered Medications  Medication Dose Route Frequency Provider Last Rate Last Dose  . acetaminophen (TYLENOL) tablet 650 mg  650 mg Oral Q6H PRN Patrecia Pour, NP   650 mg at 09/28/17 0106  . alum & mag hydroxide-simeth (MAALOX/MYLANTA) 200-200-20 MG/5ML suspension 30 mL  30 mL Oral Q4H PRN Patrecia Pour, NP      . cephALEXin (KEFLEX) capsule 500 mg  500 mg Oral Q8H Patrecia Pour, NP   500 mg at 09/29/17 0803  . DULoxetine (CYMBALTA) DR capsule 30 mg  30 mg Oral QHS Patrecia Pour, NP   30 mg at 09/28/17 2312  . haloperidol (HALDOL) tablet 5 mg  5 mg Oral QHS Lavella Hammock, MD      . hydrOXYzine (ATARAX/VISTARIL) tablet 25 mg  25 mg Oral Q6H PRN Patrecia Pour, NP      . lisinopril (PRINIVIL,ZESTRIL) tablet 10 mg  10 mg Oral Daily Patrecia Pour, NP   10 mg at 09/29/17 0803  . loperamide (IMODIUM) capsule 2-4 mg  2-4 mg Oral PRN Patrecia Pour, NP      . magnesium hydroxide (MILK OF MAGNESIA) suspension 30 mL  30 mL Oral Daily PRN Patrecia Pour, NP      . multivitamin with minerals tablet 1 tablet  1 tablet Oral Daily Patrecia Pour, NP   1 tablet at 09/29/17 0803  . ondansetron (ZOFRAN-ODT) disintegrating tablet 4 mg  4 mg Oral Q6H PRN Patrecia Pour, NP      . phenylephrine (SUDAFED PE) tablet 10 mg  10 mg Oral Q4H PRN Lavella Hammock, MD      . thiamine (VITAMIN B-1) tablet 100 mg  100 mg Oral Daily Patrecia Pour, NP   100 mg at 09/29/17 3810    Lab Results: No results found for this or any previous visit (from the past 48 hour(s)).  Blood Alcohol level:  Lab Results  Component Value Date   ETH <10 09/27/2017   ETH <5 17/51/0258    Metabolic Disorder Labs: Lab Results  Component Value Date   HGBA1C 5.4 04/01/2014   MPG 108 04/01/2014   No results found for: PROLACTIN Lab Results  Component Value Date   CHOL 187 04/01/2014   TRIG 225 (H) 04/01/2014   HDL 42 04/01/2014   CHOLHDL 4.5 04/01/2014   VLDL 45 (H) 04/01/2014   LDLCALC 100 (H) 04/01/2014   LDLCALC  03/12/2008    97        Total Cholesterol/HDL:CHD Risk Coronary Heart Disease Risk Table                     Men   Women  1/2 Average Risk   3.4   3.3    Physical Findings: AIMS: Facial and Oral Movements Muscles of Facial Expression: None, normal Lips and Perioral Area: None, normal Jaw: None, normal Tongue: None, normal,Extremity Movements Upper (arms, wrists, hands, fingers): None, normal Lower (legs, knees, ankles, toes): None, normal, Trunk Movements Neck, shoulders, hips: None, normal, Overall Severity Severity of abnormal movements (highest score from questions above): None, normal Incapacitation due to abnormal movements: None, normal Patient's awareness of abnormal movements (rate only patient's report): No Awareness, Dental Status Current problems with teeth and/or dentures?: No Does patient usually wear dentures?: No  CIWA:  CIWA-Ar Total: 11 COWS:     Musculoskeletal: Strength & Muscle Tone: within normal  limits Gait & Station: normal Patient leans: N/A  Psychiatric Specialty Exam: Physical Exam  Nursing note and vitals reviewed. Constitutional: He is oriented to person, place, and time.  Neurological: He is alert and  oriented to person, place, and time.  Grossly intact   Psychiatric: He has a normal mood and affect. His behavior is normal.    ROS  Blood pressure 133/90, pulse 85, temperature 98.6 F (37 C), temperature source Oral, resp. rate 16, height 5' 7"  (1.702 m), weight 95.3 kg (210 lb).Body mass index is 32.89 kg/m.  General Appearance: Disheveled  Eye Contact:  Good  Speech:  Clear and Coherent  Volume:  Normal  Mood:  Euthymic  Affect:  Appropriate  Thought Process:  Goal Directed  Orientation:  Full (Time, Place, and Person)  Thought Content:  Illogical and Hallucinations: None  Suicidal Thoughts:  No  Homicidal Thoughts:  No  Memory:  Recent;   Fair Remote;   Fair  Judgement:  Fair  Insight:  Fair  Psychomotor Activity:  Normal  Concentration:  Concentration: Fair and Attention Span: Fair  Recall:  Good  Fund of Knowledge:  Good  Language:  Good  Akathisia:  No  AIMS (if indicated):     Assets:  Communication Skills Desire for Improvement Housing Social Support  ADL's:  Intact  Cognition:  WNL  Sleep:  Number of Hours: 6.75     Treatment Plan Summary: Daily contact with patient to assess and evaluate symptoms and progress in treatment and Medication management   Treatment Plan Summary: Daily contact with patient to assess and evaluate symptoms and progress in treatment and Medication management    -Continue inpatient hospitalization.   -Will continue today 09/29/17  plan as below except where it is noted.   -Psychosis            - Continue haldol 5 mg HS   -Depression  -Continue Cymbalta 30 mg HS  -Delirium  -Continue Keflex 500 mg Q 8hours for UTI  -Anxiety / CIWA                     -continue Atarax 25 mg po q6h prn anxiety  -continue multivitamin and B-1 100 mg QD   - Other medical   - Prinvil 10 mg QD for HTN  - Start Sudafed 30 mg Q 6 hours for sinus/ear congestion  Will continue to monitor vitals ,medication compliance and treatment side  effects while patient is here.   Reviewed labs  -Encourage participation in groups and therapeutic milieu   -Disposition planning will be ongoing    Lavella Hammock, MD 09/29/2017, 11:27 AM

## 2017-09-30 DIAGNOSIS — Z810 Family history of intellectual disabilities: Secondary | ICD-10-CM

## 2017-09-30 DIAGNOSIS — F419 Anxiety disorder, unspecified: Secondary | ICD-10-CM

## 2017-09-30 DIAGNOSIS — F05 Delirium due to known physiological condition: Secondary | ICD-10-CM

## 2017-09-30 DIAGNOSIS — F333 Major depressive disorder, recurrent, severe with psychotic symptoms: Principal | ICD-10-CM

## 2017-09-30 MED ORDER — HYDROXYZINE HCL 25 MG PO TABS: 25.0000 mg | ORAL_TABLET | Freq: Four times a day (QID) | ORAL | Status: DC | PRN

## 2017-09-30 MED ORDER — ONDANSETRON 4 MG PO TBDP: 4.0000 mg | ORAL_TABLET | Freq: Four times a day (QID) | ORAL | Status: DC | PRN

## 2017-09-30 MED ORDER — LOPERAMIDE HCL 2 MG PO CAPS: 2.0000 mg | ORAL_CAPSULE | ORAL | Status: AC | PRN

## 2017-09-30 MED ORDER — TAMSULOSIN HCL 0.4 MG PO CAPS
0.4000 mg | ORAL_CAPSULE | Freq: Every day | ORAL | Status: DC
  Administered 2017-09-30 – 2017-10-05 (×6): 0.4 mg via ORAL
  Filled 2017-09-30 (×10): qty 1

## 2017-09-30 NOTE — [facility] Group Notes (Signed)
[facility] Group Notes:  (Nursing/MHT/Case Management/Adjunct)  Date:  09/30/2017  Time:  5:27 PM  Type of Therapy:  Nurse Education  Participation Level:  Minimal  Participation Quality:  Appropriate and Attentive  Affect:  Appropriate and Flat  Cognitive:  Alert and Appropriate  Insight:  Lacking  Engagement in Group:  Lacking  Modes of Intervention:  Discussion and Exploration  Summary of Progress/Problems: In this group, the RNs discussed current problems, stressors, or life challenges. This included suicidal thoughts, alcohol and drug abuse, and emotional struggles. The patient shared vaguely about his reason for admission, substance use, but provided minimal details and did not participate any further.   Kirstie MirzaJonathan C Daire Okimoto 09/30/2017, 5:27 PM

## 2017-09-30 NOTE — Progress Notes (Signed)
Patient ID: Walter SonsDavid B Holder, male   DOB: 02/09/1956, 62 y.o.   MRN: [id number] DAR Note: Pt observed in the dayroom not interacting. Pt endorsed moderate anxiety, depression and pain. Pt denied SI/HI or AVH; "Just taking one day at a time." Pt was med compliant. All patient's questions and concerns addressed. Support, encouragement, and safe environment provided. 15-minute safety checks continue. Pt attended wrap-up group.

## 2017-09-30 NOTE — [facility] Group Notes (Signed)
  [facility] LCSW Group Therapy Note  Date/Time:  09/30/2017 11:15AM-12:00PM  Type of Therapy and Topic:  Group Therapy:  Feelings About Hospitalization  Participation Level:  Active   Description of Group This process group involved patients discussing their feelings related to being hospitalized, as well as the benefits they see to being in the hospital.  These feelings and benefits were itemized.  The group then brainstormed specific ways in which they could seek those same benefits when they discharge and return home.  Therapeutic Goals 1. Patient will identify and describe positive and negative feelings related to hospitalization 2. Patient will verbalize benefits of hospitalization to themselves personally 3. Patients will brainstorm together ways they can obtain similar benefits in the outpatient setting, identify barriers to wellness and possible solutions  Summary of Patient Progress:  The patient expressed his primary feelings about being hospitalized are "I don't like it but I need to get well to take care of my wife."  He was able to acknowledge the need for him to make himself a priority so that he can be there to take care of his wife.  Therapeutic Modalities Cognitive Behavioral Therapy Motivational Interviewing    Ambrose MantleMareida Grossman-Orr, LCSW 09/30/2017, 5:16 PM

## 2017-09-30 NOTE — Progress Notes (Addendum)
[facility] MD Progress Note  09/30/2017 4:42 PM Walter Holder  MRN:  [id number]   Walter Holder is awake alert and oriented x3.  Seen resting in bed.  continues to report depression and anxiety.  Throughout the day patient was observed attending daily group sessions with active and engaged participation.  Denies homicidal or suicidal ideations.  Reports he is medication compliant and states he is tolerating medications well.  During this assessment patient denies auditory or visual hallucinations.  Walter Holder reports increased urination chart reviewed patient prescribed antibiotics for UTI symptoms.  Will restart Flomax 0.4mg  as patient reports taking medications at home.  Reports a fair appetite. States he is resting well throughout the night. Support encouragement reassurance was provided.  History:Per assessment note 62 yo male brought to the ED by his family for AMS after a seizure, most likely caused by Xanax withdrawal (2 mg TID)which he must have obtained from the streets since the PMP indicates no refill history for Xanax (PMP does indicate recent prescriptions for Buprenorphine). After the seizure, he was having hallucinations but denied on assessment this morning. This afternoon he did tell this provider that he is hearing voices that I am coming toward him which is scaring him. Psychosis in the past but none lately. Noted to have a UTI. Does admit to using many Xanax and feels his seizure was related to withdrawal symptoms. Depressed and anxious, slow to respond in his room, thought blocking present, not sure about how much cognitive processing is working at this time; I.e Not sure he comprehends questions. The pt is not a good historian.  Principal Problem: Major depressive disorder, recurrent, severe with psychotic features (HCC) Diagnosis:   Patient Active Problem List   Diagnosis Date Noted  . Delirium due to another medical condition [F05] 09/28/2017  . Benign prostatic hyperplasia [N40.0]  08/22/2014  . Abscess and cellulitis [L03.90, L02.91] 08/14/2014  . Cellulitis of right lower extremity [L03.115] 08/14/2014  . Recurrent major depression-severe (HCC) [F33.2] 03/29/2014  . Major depressive disorder, recurrent, severe with psychotic features (HCC) [F33.3] 03/28/2014  . Opiate dependence (HCC) [F11.20] 03/28/2014  . 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 Colitis due to Clostridium difficile [A04.72] 02/29/2012    Class: Acute  . Hypokalemia [E87.6] 02/29/2012    Class: Acute  . Low back pain [M54.5] 02/29/2012    Class: Chronic  . Acute infective gastroenteritis [A09] 02/28/2012  . Dehydration [E86.0] 02/28/2012  . Tachycardia [R00.0] 02/28/2012  . Heme positive stool [R19.5] 02/28/2012   Total Time spent with patient: 35 minutes   Past Medical History:  Past Medical History:  Diagnosis Date  . Anginal pain (HCC) ~ 2007  . Arthritis    "might be getting some" (02/28/2012)  . C. difficile diarrhea ~ 2008; 02/28/2012  . Chronic lower back pain   . Complication of anesthesia    "my ears rang before I went out" (02/28/2012)  . GERD (gastroesophageal reflux disease)   . Stress at home    "from my daughter mostly" (02/28/2012)    Past Surgical History:  Procedure Laterality Date  . CARDIAC CATHETERIZATION  ~ 2007  . MULTIPLE TOOTH EXTRACTIONS  1990's   "3" (02/28/2012)   Family History:  Family History  Problem Relation Age of Onset  . Alzheimer's disease Mother   . Diabetes Mellitus II Neg Hx    Family Psychiatric  History: see H&P Social History:  Social History   Substance and Sexual Activity  Alcohol Use Not Currently  Comment: 02/28/2012 "5-6 beers on the weekends" 08/22/14 none.     Social History   Substance and Sexual Activity  Drug Use Yes  . Types: Marijuana, LSD, Benzodiazepines, Cocaine   Comment: 02/28/2012 "back in the 1970's"     Social History   Socioeconomic History  . Marital status: Married    Spouse  name: Not on file  . Number of children: Not on file  . Years of education: Not on file  . Highest education level: Not on file  Occupational History  . Not on file  Social Needs  . Financial resource strain: Not on file  . Food insecurity:    Worry: Not on file    Inability: Not on file  . Transportation needs:    Medical: Not on file    Non-medical: Not on file  Tobacco Use  . Smoking status: Never Smoker  . Smokeless tobacco: Never Used  Substance and Sexual Activity  . Alcohol use: Not Currently    Comment: 02/28/2012 "5-6 beers on the weekends" 08/22/14 none.  . Drug use: Yes    Types: Marijuana, LSD, Benzodiazepines, Cocaine    Comment: 02/28/2012 "back in the 1970's"   . Sexual activity: Yes  Lifestyle  . Physical activity:    Days per week: Not on file    Minutes per session: Not on file  . Stress: Not on file  Relationships  . Social connections:    Talks on phone: Not on file    Gets together: Not on file    Attends religious service: Not on file    Active member of club or organization: Not on file    Attends meetings of clubs or organizations: Not on file    Relationship status: Not on file  Other Topics Concern  . Not on file  Social History Narrative  . Not on file   Additional Social History:     See H&P                    Sleep: Good  Appetite:  Good  Current Medications: Current Facility-Administered Medications  Medication Dose Route Frequency Provider Last Rate Last Dose  . acetaminophen (TYLENOL) tablet 650 mg  650 mg Oral Q6H PRN Charm Rings, NP   650 mg at 09/29/17 2138  . alum & mag hydroxide-simeth (MAALOX/MYLANTA) 200-200-20 MG/5ML suspension 30 mL  30 mL Oral Q4H PRN Charm Rings, NP      . cephALEXin (KEFLEX) capsule 500 mg  500 mg Oral Q8H Charm Rings, NP   500 mg at 09/30/17 1331  . DULoxetine (CYMBALTA) DR capsule 30 mg  30 mg Oral QHS Charm Rings, NP   30 mg at 09/29/17 2138  . haloperidol (HALDOL) tablet 5  mg  5 mg Oral QHS Mariel Craft, MD   5 mg at 09/29/17 2138  . hydrOXYzine (ATARAX/VISTARIL) tablet 25 mg  25 mg Oral Q6H PRN Charm Rings, NP   25 mg at 09/30/17 1330  . lisinopril (PRINIVIL,ZESTRIL) tablet 10 mg  10 mg Oral Daily Charm Rings, NP   10 mg at 09/30/17 0806  . loperamide (IMODIUM) capsule 2-4 mg  2-4 mg Oral PRN Charm Rings, NP      . magnesium hydroxide (MILK OF MAGNESIA) suspension 30 mL  30 mL Oral Daily PRN Charm Rings, NP      . multivitamin with minerals tablet 1 tablet  1 tablet Oral Daily Nanine Means  Y, NP   1 tablet at 09/30/17 0806  . ondansetron (ZOFRAN-ODT) disintegrating tablet 4 mg  4 mg Oral Q6H PRN Charm Rings, NP      . pseudoephedrine (SUDAFED) tablet 30 mg  30 mg Oral Q6H PRN Mariel Craft, MD      . tamsulosin (FLOMAX) capsule 0.4 mg  0.4 mg Oral QPC supper Oneta Rack, NP      . thiamine (VITAMIN B-1) tablet 100 mg  100 mg Oral Daily Charm Rings, NP   100 mg at 09/30/17 6962    Lab Results: No results found for this or any previous visit (from the past 48 hour(s)).  Blood Alcohol level:  Lab Results  Component Value Date   ETH <10 09/27/2017   ETH <5 03/27/2014    Metabolic Disorder Labs: Lab Results  Component Value Date   HGBA1C 5.4 04/01/2014   MPG 108 04/01/2014   No results found for: PROLACTIN Lab Results  Component Value Date   CHOL 187 04/01/2014   TRIG 225 (H) 04/01/2014   HDL 42 04/01/2014   CHOLHDL 4.5 04/01/2014   VLDL 45 (H) 04/01/2014   LDLCALC 100 (H) 04/01/2014   LDLCALC  03/12/2008    97        Total Cholesterol/HDL:CHD Risk Coronary Heart Disease Risk Table                     Men   Women  1/2 Average Risk   3.4   3.3    Physical Findings: AIMS: Facial and Oral Movements Muscles of Facial Expression: None, normal Lips and Perioral Area: None, normal Jaw: None, normal Tongue: None, normal,Extremity Movements Upper (arms, wrists, hands, fingers): None, normal Lower (legs, knees,  ankles, toes): None, normal, Trunk Movements Neck, shoulders, hips: None, normal, Overall Severity Severity of abnormal movements (highest score from questions above): None, normal Incapacitation due to abnormal movements: None, normal Patient's awareness of abnormal movements (rate only patient's report): No Awareness, Dental Status Current problems with teeth and/or dentures?: No Does patient usually wear dentures?: No  CIWA:  CIWA-Ar Total: 8 COWS:  COWS Total Score: 2  Musculoskeletal: Strength & Muscle Tone: within normal limits Gait & Station: normal Patient leans: N/A  Psychiatric Specialty Exam: Physical Exam  Nursing note and vitals reviewed. Constitutional: He is oriented to person, place, and time.  Neurological: He is alert and oriented to person, place, and time.  Grossly intact   Psychiatric: He has a normal mood and affect. His behavior is normal.    Review of Systems  Psychiatric/Behavioral: Positive for depression. The patient is nervous/anxious.   All other systems reviewed and are negative.   Blood pressure 130/82, pulse 80, temperature 98.2 F (36.8 C), temperature source Oral, resp. rate 16, height 5\' 7"  (1.702 m), weight 95.3 kg (210 lb).Body mass index is 32.89 kg/m.  General Appearance: Disheveled, pleasant, clam and cooperative   Eye Contact:  Good  Speech:  Clear and Coherent  Volume:  Normal  Mood:  Euthymic  Affect:  Appropriate  Thought Process:  Goal Directed  Orientation:  Full (Time, Place, and Person)  Thought Content:  Illogical and Hallucinations: None  Suicidal Thoughts:  No  Homicidal Thoughts:  No  Memory:  Recent;   Fair Remote;   Fair  Judgement:  Fair  Insight:  Fair  Psychomotor Activity:  Normal  Concentration:  Concentration: Fair and Attention Span: Fair  Recall:  Good  Fund of Knowledge:  Good  Language:  Good  Akathisia:  No  AIMS (if indicated):     Assets:  Communication Skills Desire for  Improvement Housing Social Support  ADL's:  Intact  Cognition:  WNL  Sleep:  Number of Hours: 6.75    Treatment Plan Summary: Daily contact with patient to assess and evaluate symptoms and progress in treatment and Medication management    -Will continue with current treatment plan on  09/30/17  plan as below except where it is noted.   -Psychosis            - Continue haldol 5 mg HS will consider holding medication if UT symptoms worsen   -Depression  -Continue Cymbalta 30 mg HS  -Delirium  -Continue Keflex 500 mg Q 8hours for UTI  -Anxiety / CIWA                     -continue Atarax 25 mg po q6h prn anxiety  -continue multivitamin and B-1 100 mg QD   - Other medical   - Prinvil 10 mg QD for HTN  - Continue Sudafed 30 mg Q 6 hours for sinus/ear congestion  Will continue to monitor vitals ,medication compliance and treatment side effects while patient is here.   Reviewed labs  -Encourage participation in groups and therapeutic milieu -Disposition planning will be ongoing    Oneta Rackanika N Lewis, NP 09/30/2017, 4:42 PM   ..Agree with NP Progress Note

## 2017-09-30 NOTE — Plan of Care (Signed)
D: Patient appears tremulous, but denies feeling anxious/nauseous/in withdrawal. He slept well last night, and received medication that was helpful. His appetite is good, energy low, and concentration poor. He rates his depression, anxiety and feeling of hopelessness 4/10. He denies SI/HI/AVH. A: Patient checked q15 min, and checks reviewed. Reviewed medication changes with patient and educated on side effects. Educated patient on importance of attending group therapy sessions and educated on several coping skills. Encouarged participation in milieu through recreation therapy and attending meals with peers. Support and encouragement provided. Fluids offered. R: Patient receptive to education on medications, and is medication compliant. Patient contracts for safety on the unit. Goal for today "Get it together" and to meet that goal "what it takes."

## 2017-10-01 LAB — CK: Total CK: 61 U/L (ref 49–397)

## 2017-10-01 NOTE — Progress Notes (Addendum)
[facility]  10/01/2017 12:43 PM Erin SonsDavid B Vandevoorde  MRN:  [id number]   Onalee HuaDavid seen resting in dayroom.  Denies that he is homicidal or suicidal during this assessment reports feeling better overall.  Patient reports he is hopeful to discharge soon.  states he resides with his wife,  son and daughter-in-law.  Denies suicidal or homicidal ideations during this assessment.  Reports taking medications as prescribed and tolerating them well.  Denies depression or depressive symptoms.  Reports attending group session with active and engaged participation.  However is unable to recall group topic at this time.  Reports a good appetite.  States he is resting well.  Was reported that patient is isolative today flat and guarded.  Patient reports he is UTI symptoms have improved. Support encouragement reassurance was provided.  History:Per assessment note [age] yo male brought to the ED by his family for AMS after a seizure, most likely caused by Xanax withdrawal (2 mg TID)which he must have obtained from the streets since the PMP indicates no refill history for Xanax (PMP does indicate recent prescriptions for Buprenorphine). After the seizure, he was having hallucinations but denied on assessment this morning. This afternoon he did tell this provider that he is hearing voices that I am coming toward him which is scaring him. Psychosis in the past but none lately. Noted to have a UTI. Does admit to using many Xanax and feels his seizure was related to withdrawal symptoms. Depressed and anxious, slow to respond in his room, thought blocking present, not sure about how much cognitive processing is working at this time; I.e Not sure he comprehends questions. The pt is not a good historian.  Principal Problem: Major depressive disorder, recurrent, severe with psychotic features (HCC) Diagnosis:   Patient Active Problem List   Diagnosis Date Noted  . Delirium due to another medical condition [F05]  09/28/2017  . Benign prostatic hyperplasia [N40.0] 08/22/2014  . Abscess and cellulitis [L03.90, L02.91] 08/14/2014  . Cellulitis of right lower extremity [L03.115] 08/14/2014  . Recurrent major depression-severe (HCC) [F33.2] 03/29/2014  . Major depressive disorder, recurrent, severe with psychotic features (HCC) [F33.3] 03/28/2014  . Opiate dependence (HCC) [F11.20] 03/28/2014  . 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 Colitis due to Clostridium difficile [A04.72] 02/29/2012    Class: Acute  . Hypokalemia [E87.6] 02/29/2012    Class: Acute  . Low back pain [M54.5] 02/29/2012    Class: Chronic  . Acute infective gastroenteritis [A09] 02/28/2012  . Dehydration [E86.0] 02/28/2012  . Tachycardia [R00.0] 02/28/2012  . Heme positive stool [R19.5] 02/28/2012   Total Time spent with patient: 35 minutes   Past Medical History:  Past Medical History:  Diagnosis Date  . Anginal pain (HCC) ~ 2007  . Arthritis    "might be getting some" (02/28/2012)  . C. difficile diarrhea ~ 2008; 02/28/2012  . Chronic lower back pain   . Complication of anesthesia    "my ears rang before I went out" (02/28/2012)  . GERD (gastroesophageal reflux disease)   . Stress at home    "from my daughter mostly" (02/28/2012)    Past Surgical History:  Procedure Laterality Date  . CARDIAC CATHETERIZATION  ~ 2007  . MULTIPLE TOOTH EXTRACTIONS  1990's   "3" (02/28/2012)   Family History:  Family History  Problem Relation Age of Onset  . Alzheimer's disease Mother   . Diabetes Mellitus II Neg Hx    Family Psychiatric  History: see H&P Social History:  Social History   Substance and Sexual Activity  Alcohol Use Not Currently   Comment: 02/28/2012 "5-6 beers on the weekends" 08/22/14 none.     Social History   Substance and Sexual Activity  Drug Use Yes  . Types: Marijuana, LSD, Benzodiazepines, Cocaine   Comment: 02/28/2012 "back in the 1970's"     Social History    Socioeconomic History  . Marital status: Married    Spouse name: Not on file  . Number of children: Not on file  . Years of education: Not on file  . Highest education level: Not on file  Occupational History  . Not on file  Social Needs  . Financial resource strain: Not on file  . Food insecurity:    Worry: Not on file    Inability: Not on file  . Transportation needs:    Medical: Not on file    Non-medical: Not on file  Tobacco Use  . Smoking status: Never Smoker  . Smokeless tobacco: Never Used  Substance and Sexual Activity  . Alcohol use: Not Currently    Comment: 02/28/2012 "5-6 beers on the weekends" 08/22/14 none.  . Drug use: Yes    Types: Marijuana, LSD, Benzodiazepines, Cocaine    Comment: 02/28/2012 "back in the 1970's"   . Sexual activity: Yes  Lifestyle  . Physical activity:    Days per week: Not on file    Minutes per session: Not on file  . Stress: Not on file  Relationships  . Social connections:    Talks on phone: Not on file    Gets together: Not on file    Attends religious service: Not on file    Active member of club or organization: Not on file    Attends meetings of clubs or organizations: Not on file    Relationship status: Not on file  Other Topics Concern  . Not on file  Social History Narrative  . Not on file   Additional Social History:     See H&P                    Sleep: Good  Appetite:  Good  Current Medications: Current Facility-Administered Medications  Medication Dose Route Frequency Provider Last Rate Last Dose  . acetaminophen (TYLENOL) tablet 650 mg  650 mg Oral Q6H PRN Charm Rings, NP   650 mg at 09/29/17 2138  . alum & mag hydroxide-simeth (MAALOX/MYLANTA) 200-200-20 MG/5ML suspension 30 mL  30 mL Oral Q4H PRN Charm Rings, NP      . cephALEXin (KEFLEX) capsule 500 mg  500 mg Oral Q8H Charm Rings, NP   500 mg at 10/01/17 1610  . DULoxetine (CYMBALTA) DR capsule 30 mg  30 mg Oral QHS Charm Rings, NP   30 mg at 09/30/17 2114  . haloperidol (HALDOL) tablet 5 mg  5 mg Oral QHS Mariel Craft, MD   5 mg at 09/30/17 2114  . hydrOXYzine (ATARAX/VISTARIL) tablet 25 mg  25 mg Oral Q6H PRN Kerry Hough, PA-C      . lisinopril (PRINIVIL,ZESTRIL) tablet 10 mg  10 mg Oral Daily Charm Rings, NP   10 mg at 10/01/17 0750  . loperamide (IMODIUM) capsule 2-4 mg  2-4 mg Oral PRN Donell Sievert E, PA-C      . magnesium hydroxide (MILK OF MAGNESIA) suspension 30 mL  30 mL Oral Daily PRN Charm Rings, NP      . multivitamin  with minerals tablet 1 tablet  1 tablet Oral Daily Charm Rings, NP   1 tablet at 10/01/17 0750  . ondansetron (ZOFRAN-ODT) disintegrating tablet 4 mg  4 mg Oral Q6H PRN Donell Sievert E, PA-C      . pseudoephedrine (SUDAFED) tablet 30 mg  30 mg Oral Q6H PRN Mariel Craft, MD      . tamsulosin [facility]) capsule 0.4 mg  0.4 mg Oral QPC supper Oneta Rack, NP   0.4 mg at 09/30/17 1900  . thiamine (VITAMIN B-1) tablet 100 mg  100 mg Oral Daily Charm Rings, NP   100 mg at 10/01/17 1610    Lab Results:  Results for orders placed or performed during the hospital encounter of 09/27/17 (from the past 48 hour(s))  CK     Status: None   Collection Time: 10/01/17  6:32 AM  Result Value Ref Range   Total CK 61 49 - 397 U/L    Comment: Performed at [facility], [address] [place], [place] [address]    Blood Alcohol level:  Lab Results  Component Value Date   ETH <10 09/27/2017   ETH <5 03/27/2014    Metabolic Disorder Labs: Lab Results  Component Value Date   HGBA1C 5.4 04/01/2014   MPG 108 04/01/2014   No results found for: PROLACTIN Lab Results  Component Value Date   CHOL 187 04/01/2014   TRIG 225 (H) 04/01/2014   HDL 42 04/01/2014   CHOLHDL 4.5 04/01/2014   VLDL 45 (H) 04/01/2014   LDLCALC 100 (H) 04/01/2014   LDLCALC  03/12/2008    97        Total Cholesterol/HDL:CHD Risk Coronary Heart Disease Risk Table                      Men   Women  1/2 Average Risk   3.4   3.3    Physical Findings: AIMS: Facial and Oral Movements Muscles of Facial Expression: None, normal Lips and Perioral Area: None, normal Jaw: None, normal Tongue: None, normal,Extremity Movements Upper (arms, wrists, hands, fingers): None, normal Lower (legs, knees, ankles, toes): None, normal, Trunk Movements Neck, shoulders, hips: None, normal, Overall Severity Severity of abnormal movements (highest score from questions above): None, normal Incapacitation due to abnormal movements: None, normal Patient's awareness of abnormal movements (rate only patient's report): No Awareness, Dental Status Current problems with teeth and/or dentures?: No Does patient usually wear dentures?: No  CIWA:  CIWA-Ar Total: 3 COWS:  COWS Total Score: 2  Musculoskeletal: Strength & Muscle Tone: within normal limits Gait & Station: normal Patient leans: N/A  Psychiatric Specialty Exam: Physical Exam  Nursing note and vitals reviewed. Constitutional: He is oriented to person, place, and time.  Neurological: He is alert and oriented to person, place, and time.  Grossly intact   Psychiatric: He has a normal mood and affect. His behavior is normal.    Review of Systems  Psychiatric/Behavioral: Positive for depression. The patient is nervous/anxious.   All other systems reviewed and are negative.   Blood pressure 131/80, pulse (!) 105, temperature 98.3 F (36.8 C), temperature source Oral, resp. rate 18, height 5\' 7"  (1.702 m), weight 95.3 kg (210 lb), SpO2 97 %.Body mass index is 32.89 kg/m.  General Appearance: Disheveled, pleasant, clam and cooperative   Eye Contact:  Good  Speech:  Clear and Coherent  Volume:  Normal  Mood:  Euthymic  Affect:  Appropriate  Thought Process:  Goal Directed  Orientation:  Full (Time, Place, and Person)  Thought Content:  Illogical and Hallucinations: None  Suicidal Thoughts:  No  Homicidal Thoughts:  No   Memory:  Recent;   Fair Remote;   Fair  Judgement:  Fair  Insight:  Fair  Psychomotor Activity:  Normal  Concentration:  Concentration: Fair and Attention Span: Fair  Recall:  Good  Fund of Knowledge:  Good  Language:  Good  Akathisia:  No  AIMS (if indicated):     Assets:  Communication Skills Desire for Improvement Housing Social Support  ADL's:  Intact  Cognition:  WNL  Sleep:  Number of Hours: 6.25    Treatment Plan Summary: Daily contact with patient to assess and evaluate symptoms and progress in treatment and Medication management    -Will continue with current treatment plan on  10/01/17  plan as below except where it is noted.   -Psychosis            - Continue haldol 5 mg HS will consider holding medication if UTI symptoms worsen   -Depression  -Continue Cymbalta 30 mg HS  -Delirium  -Continue Keflex 500 mg Q 8hours for UTI  -Anxiety / CIWA                     -continue Atarax 25 mg po q6h prn anxiety  -continue multivitamin and B-1 100 mg QD   - Other medical   - Prinvil 10 mg QD for HTN  - Continue Sudafed 30 mg Q 6 hours for sinus/ear congestion  - restarted Flomax 0.4mg  for BPH  Will continue to monitor vitals ,medication compliance and treatment side effects while patient is here.   Reviewed labs  -Encourage participation in groups and therapeutic milieu -Disposition planning will be ongoing    Oneta Rack, NP 10/01/2017, 12:43 PM   ..Agree with NP Progress Note

## 2017-10-01 NOTE — [facility] Group Notes (Signed)
[facility] Group Notes: (Clinical Social Work)   10/01/2017      Type of Therapy:  Group Therapy   Participation Level:  Did Not Attend despite MHT prompting   Sierra Bissonette Grossman-Orr, LCSW 10/01/2017, 2:05 PM     

## 2017-10-01 NOTE — Plan of Care (Signed)
D: Pt denies SI/HI/AV. Pt is pleasant and cooperative. Pt spent the majority of the evening in his room.  A: Pt was offered support and encouragement. Pt was given scheduled medications. Pt was encourage to attend groups. Q 15 minute checks were done for safety.   R:Pt attends groups and interacts well with peers and staff. Pt is taking medication. Pt has no complaints.Pt receptive to treatment and safety maintained on unit.   Problem: Education: Goal: Emotional status will improve Outcome: Progressing   Problem: Education: Goal: Mental status will improve Outcome: Progressing   Problem: Activity: Goal: Interest or engagement in activities will improve Outcome: Not Progressing   Problem: Activity: Goal: Sleeping patterns will improve Outcome: Progressing

## 2017-10-01 NOTE — Plan of Care (Signed)
D: Patient presents flat, guarded, withdrawn. He did attend a group therapy session yesterday, but his participation is minimal. He states his mood is "fine". He is still tremulous, but states he does not feel anxious. He denies pain at this time. He also denies SI/HI/AVH. A: Patient checked q15 min, and checks reviewed. Reviewed medication changes with patient and educated on side effects. Educated patient on importance of attending group therapy sessions and educated on several coping skills. Encouarged participation in milieu through recreation therapy and attending meals with peers. Support and encouragement provided. Fluids offered. R: Patient receptive to education on medications, and is medication compliant. Patient contracts for safety on the unit. He did not fill out a self inventory sheet.

## 2017-10-01 NOTE — Plan of Care (Signed)
D: Pt denies SI/HI/AV hallucinations. Pt is cooperative. Patient has been in his room most of shift. Pt goal for today is to work on his depression and anxiety. A: Pt was offered support and encouragement. Pt was given scheduled medications. Pt was encourage to attend groups. Q 15 minute checks were done for safety.  R: Pt is taking medication. Pt has no complaints.Pt receptive to treatment and safety maintained on unit.    Problem: Safety: Goal: Periods of time without injury will increase Outcome: Progressing Note:  Patient denies SI

## 2017-10-02 ENCOUNTER — Inpatient Hospital Stay (HOSPITAL_COMMUNITY): Payer: Medicare HMO

## 2017-10-02 ENCOUNTER — Emergency Department (HOSPITAL_COMMUNITY): Admission: EM | Admit: 2017-10-02 | Discharge: 2017-10-02 | Discharge status: Absent without leave | Payer: Medicare HMO

## 2017-10-02 ENCOUNTER — Other Ambulatory Visit: Payer: Self-pay

## 2017-10-02 DIAGNOSIS — G4700 Insomnia, unspecified: Secondary | ICD-10-CM

## 2017-10-02 DIAGNOSIS — F1120 Opioid dependence, uncomplicated: Secondary | ICD-10-CM

## 2017-10-02 DIAGNOSIS — F1310 Sedative, hypnotic or anxiolytic abuse, uncomplicated: Secondary | ICD-10-CM

## 2017-10-02 DIAGNOSIS — R569 Unspecified convulsions: Secondary | ICD-10-CM

## 2017-10-02 LAB — CBC WITH DIFFERENTIAL/PLATELET
BASOS ABS: 0 10*3/uL (ref 0.0–0.1)
BASOS PCT: 0 %
Eosinophils Absolute: 0 10*3/uL (ref 0.0–0.7)
Eosinophils Relative: 0 %
HEMATOCRIT: 44.4 % (ref 39.0–52.0)
HEMOGLOBIN: 15.1 g/dL (ref 13.0–17.0)
LYMPHS PCT: 21 %
Lymphs Abs: 1.9 10*3/uL (ref 0.7–4.0)
MCH: 29.5 pg (ref 26.0–34.0)
MCHC: 34 g/dL (ref 30.0–36.0)
MCV: 86.9 fL (ref 78.0–100.0)
MONOS PCT: 5 %
Monocytes Absolute: 0.5 10*3/uL (ref 0.1–1.0)
NEUTROS ABS: 6.8 10*3/uL (ref 1.7–7.7)
NEUTROS PCT: 74 %
Platelets: 264 10*3/uL (ref 150–400)
RBC: 5.11 MIL/uL (ref 4.22–5.81)
RDW: 15.4 % (ref 11.5–15.5)
WBC: 9.3 10*3/uL (ref 4.0–10.5)

## 2017-10-02 LAB — COMPREHENSIVE METABOLIC PANEL
ALBUMIN: 3.8 g/dL (ref 3.5–5.0)
ALK PHOS: 66 U/L (ref 38–126)
ALT: 26 U/L (ref 0–44)
AST: 18 U/L (ref 15–41)
Anion gap: 11 (ref 5–15)
BILIRUBIN TOTAL: 0.8 mg/dL (ref 0.3–1.2)
BUN: 11 mg/dL (ref 6–20)
CALCIUM: 9.1 mg/dL (ref 8.9–10.3)
CO2: 24 mmol/L (ref 22–32)
CREATININE: 0.74 mg/dL (ref 0.61–1.24)
Chloride: 104 mmol/L (ref 98–111)
GFR calc Af Amer: >60 mL/min (ref >60)
GLUCOSE: 150 mg/dL — AB (ref 70–99)
POTASSIUM: 3.5 mmol/L (ref 3.5–5.1)
Sodium: 139 mmol/L (ref 135–145)
Total Protein: 7.6 g/dL (ref 6.5–8.1)

## 2017-10-02 LAB — URINALYSIS, ROUTINE W REFLEX MICROSCOPIC
Bilirubin Urine: NEGATIVE
GLUCOSE, UA: NEGATIVE mg/dL
Hgb urine dipstick: NEGATIVE
KETONES UR: 5 mg/dL — AB
Leukocytes, UA: NEGATIVE
Nitrite: NEGATIVE
PH: 5 (ref 5.0–8.0)
Protein, ur: NEGATIVE mg/dL
SPECIFIC GRAVITY, URINE: 1.016 (ref 1.005–1.030)

## 2017-10-02 LAB — RAPID URINE DRUG SCREEN, HOSP PERFORMED
AMPHETAMINES: NOT DETECTED
Benzodiazepines: NOT DETECTED
Cocaine: NOT DETECTED
OPIATES: NOT DETECTED
TETRAHYDROCANNABINOL: POSITIVE — AB

## 2017-10-02 LAB — AMMONIA: Ammonia: 20 umol/L (ref 9–35)

## 2017-10-02 MED ORDER — LISINOPRIL 10 MG PO TABS: 10.0000 mg | ORAL_TABLET | Freq: Every day | ORAL | Status: DC

## 2017-10-02 MED ORDER — TAMSULOSIN HCL 0.4 MG PO CAPS: 0.4000 mg | ORAL_CAPSULE | Freq: Every day | ORAL | Status: DC

## 2017-10-02 MED ORDER — HALOPERIDOL 5 MG PO TABS: 5.0000 mg | ORAL_TABLET | Freq: Every day | ORAL | 0 refills | Status: DC

## 2017-10-02 MED ORDER — HYDROXYZINE HCL 25 MG PO TABS: ORAL_TABLET | ORAL | 0 refills | Status: DC

## 2017-10-02 MED ORDER — DULOXETINE HCL 30 MG PO CPEP: 30.0000 mg | ORAL_CAPSULE | Freq: Every day | ORAL | 0 refills | Status: DC

## 2017-10-02 MED ORDER — CEPHALEXIN 500 MG PO CAPS: 500.0000 mg | ORAL_CAPSULE | Freq: Three times a day (TID) | ORAL | 0 refills | Status: DC

## 2017-10-02 NOTE — Progress Notes (Signed)
[facility] MD Progress Note  10/02/2017 6:37 PM DVID PENDRY  MRN:  [id number]   subjective: "I am here because I died for 18 minutes"  History:Per assessment note 62 yo male brought to the ED by his family for AMS after a seizure, most likely caused by Xanax withdrawal (2 mg TID)which he must have obtained from the streets since the PMP indicates no refill history for Xanax (PMP does indicate recent prescriptions for Buprenorphine). After the seizure, he was having hallucinations but denied on assessment this morning. This afternoon he did tell this provider that he is hearing voices that I am coming toward him which is scaring him. Psychosis in the past but none lately. Noted to have a UTI. Does admit to using many Xanax and feels his seizure was related to withdrawal symptoms. Depressed and anxious, slow to respond in his room, thought blocking present, not sure about how much cognitive processing is working at this time; I.e Not sure he comprehends questions. The pt is not a good historian.  Lin Givens  is seen, chart reviewed. The chart findings discussed with the treatment team. Shanon Brow seen resting in room.  He is slow to respond, and has difficulty moving.  He is oriented to person and place with clues.  He does not know date and reports that it is 1955-09-26.  He is unable to answer questions regarding AVH, homicidal or suicidal thought during this assessment.  Nursing reports that after breakfast he was found laying in another patient's bed.  He reported that he saw the name Johntavious on the door and went in. Support encouragement reassurance was provided.  Reviewed head CT's from 09/24/2017 to 09/27/2017 with noted change.  Neurology consult requested, and transport to ED arranged for further medical work-up in setting of delirium.   Reviewed about  Principal Problem: Major depressive disorder, recurrent, severe with psychotic features ([place]) Diagnosis:   Patient Active Problem List    Diagnosis Date Noted  . Delirium due to another medical condition [F05] 09/28/2017  . Benign prostatic hyperplasia [N40.0] 08/22/2014  . Abscess and cellulitis [L03.90, L02.91] 08/14/2014  . Cellulitis of right lower extremity [L03.115] 08/14/2014  . Recurrent major depression-severe ([place]) [F33.2] 03/29/2014  . Major depressive disorder, recurrent, severe with psychotic features ([place]) [F33.3] 03/28/2014  . Opiate dependence ([place]) [F11.20] 03/28/2014  . Benzodiazepine abuse, episodic ([place]) [F13.10] 03/28/2014  . Colitis due to Clostridium difficile [A04.72] 02/29/2012    Class: Acute  . Hypokalemia [E87.6] 02/29/2012    Class: Acute  . Low back pain [M54.5] 02/29/2012    Class: Chronic  . Acute infective gastroenteritis [A09] 02/28/2012  . Dehydration [E86.0] 02/28/2012  . Tachycardia [R00.0] 02/28/2012  . Heme positive stool [R19.5] 02/28/2012   Total Time spent with patient: 45 minutes   Past Medical History:  Past Medical History:  Diagnosis Date  . Anginal pain ([place]) ~ 2007  . Arthritis    "might be getting some" (02/28/2012)  . C. difficile diarrhea ~ 2008; 02/28/2012  . Chronic lower back pain   . Complication of anesthesia    "my ears rang before I went out" (02/28/2012)  . GERD (gastroesophageal reflux disease)   . Stress at home    "from my daughter mostly" (02/28/2012)    Past Surgical History:  Procedure Laterality Date  . CARDIAC CATHETERIZATION  ~ 2007  . MULTIPLE TOOTH EXTRACTIONS  1990's   "3" (02/28/2012)   Family History:  Family History  Problem Relation Age of Onset  .  Alzheimer's disease Mother   . Diabetes Mellitus II Neg Hx    Family Psychiatric  History: see H&P Social History:  Social History   Substance and Sexual Activity  Alcohol Use Not Currently   Comment: 02/28/2012 "5-6 beers on the weekends" 08/22/14 none.     Social History   Substance and Sexual Activity  Drug Use Yes  . Types: Marijuana, LSD, Benzodiazepines, Cocaine    Comment: 02/28/2012 "back in the 1970's"     Social History   Socioeconomic History  . Marital status: Married    Spouse name: Not on file  . Number of children: Not on file  . Years of education: Not on file  . Highest education level: Not on file  Occupational History  . Not on file  Social Needs  . Financial resource strain: Not on file  . Food insecurity:    Worry: Not on file    Inability: Not on file  . Transportation needs:    Medical: Not on file    Non-medical: Not on file  Tobacco Use  . Smoking status: Never Smoker  . Smokeless tobacco: Never Used  Substance and Sexual Activity  . Alcohol use: Not Currently    Comment: 02/28/2012 "5-6 beers on the weekends" 08/22/14 none.  . Drug use: Yes    Types: Marijuana, LSD, Benzodiazepines, Cocaine    Comment: 02/28/2012 "back in the 1970's"   . Sexual activity: Yes  Lifestyle  . Physical activity:    Days per week: Not on file    Minutes per session: Not on file  . Stress: Not on file  Relationships  . Social connections:    Talks on phone: Not on file    Gets together: Not on file    Attends religious service: Not on file    Active member of club or organization: Not on file    Attends meetings of clubs or organizations: Not on file    Relationship status: Not on file  Other Topics Concern  . Not on file  Social History Narrative  . Not on file   Additional Social History:     See H&P                    Sleep: Good per documentation  Appetite:  unable to assess  Current Medications: Current Facility-Administered Medications  Medication Dose Route Frequency Provider Last Rate Last Dose  . acetaminophen (TYLENOL) tablet 650 mg  650 mg Oral Q6H PRN Patrecia Pour, NP   650 mg at 09/29/17 2138  . alum & mag hydroxide-simeth (MAALOX/MYLANTA) 200-200-20 MG/5ML suspension 30 mL  30 mL Oral Q4H PRN Patrecia Pour, NP      . cephALEXin (KEFLEX) capsule 500 mg  500 mg Oral Q8H Patrecia Pour, NP   500  mg at 10/02/17 1720  . DULoxetine (CYMBALTA) DR capsule 30 mg  30 mg Oral QHS Patrecia Pour, NP   30 mg at 10/01/17 2214  . haloperidol (HALDOL) tablet 5 mg  5 mg Oral QHS Lavella Hammock, MD   5 mg at 10/01/17 2214  . hydrOXYzine (ATARAX/VISTARIL) tablet 25 mg  25 mg Oral Q6H PRN Laverle Hobby, PA-C      . lisinopril (PRINIVIL,ZESTRIL) tablet 10 mg  10 mg Oral Daily Patrecia Pour, NP   10 mg at 10/02/17 0844  . loperamide (IMODIUM) capsule 2-4 mg  2-4 mg Oral PRN Laverle Hobby, PA-C      .  magnesium hydroxide (MILK OF MAGNESIA) suspension 30 mL  30 mL Oral Daily PRN Patrecia Pour, NP      . multivitamin with minerals tablet 1 tablet  1 tablet Oral Daily Patrecia Pour, NP   1 tablet at 10/02/17 0844  . ondansetron (ZOFRAN-ODT) disintegrating tablet 4 mg  4 mg Oral Q6H PRN Patriciaann Clan E, PA-C      . pseudoephedrine (SUDAFED) tablet 30 mg  30 mg Oral Q6H PRN Lavella Hammock, MD      . tamsulosin [facility]) capsule 0.4 mg  0.4 mg Oral QPC supper Derrill Center, NP   0.4 mg at 10/01/17 1824  . thiamine (VITAMIN B-1) tablet 100 mg  100 mg Oral Daily Patrecia Pour, NP   100 mg at 10/02/17 3557   Current Outpatient Medications  Medication Sig Dispense Refill  . cephALEXin (KEFLEX) 500 MG capsule Take 1 capsule (500 mg total) by mouth every 8 (eight) hours. For infection 15 capsule 0  . DULoxetine (CYMBALTA) 30 MG capsule Take 30 mg by mouth at bedtime.    . DULoxetine (CYMBALTA) 30 MG capsule Take 1 capsule (30 mg total) by mouth at bedtime. For depression 30 capsule 0  . haloperidol (HALDOL) 5 MG tablet Take 1 tablet (5 mg total) by mouth at bedtime. For mood control 30 tablet 0  . hydrOXYzine (ATARAX/VISTARIL) 25 MG tablet Take 1 tablet (25 mg) by mouth Q 6 hours as needed: For anxiety 60 tablet 0  . [START ON 10/03/2017] lisinopril (PRINIVIL,ZESTRIL) 10 MG tablet Take 1 tablet (10 mg total) by mouth daily. For high blood pressure    . Melatonin 3 MG TABS Take 3-9 mg by mouth at  bedtime as needed (SLEEP).    . tamsulosin (FLOMAX) 0.4 MG CAPS capsule Take 1 capsule (0.4 mg total) by mouth daily after supper. For prostate health 1 capsule   . vitamin B-12 (CYANOCOBALAMIN) 1000 MCG tablet Take 1,000 mcg by mouth daily.      Lab Results:  Results for orders placed or performed during the hospital encounter of 09/27/17 (from the past 48 hour(s))  CK     Status: None   Collection Time: 10/01/17  6:32 AM  Result Value Ref Range   Total CK 61 49 - 397 U/L    Comment: Performed at [facility], [place] [address], [place], [place] [address]  Urinalysis, Routine w reflex microscopic     Status: Abnormal   Collection Time: 10/02/17  5:00 PM  Result Value Ref Range   Color, Urine YELLOW YELLOW   APPearance HAZY (A) CLEAR   Specific Gravity, Urine 1.016 1.005 - 1.030   pH 5.0 5.0 - 8.0   Glucose, UA NEGATIVE NEGATIVE mg/dL   Hgb urine dipstick NEGATIVE NEGATIVE   Bilirubin Urine NEGATIVE NEGATIVE   Ketones, ur 5 (A) NEGATIVE mg/dL   Protein, ur NEGATIVE NEGATIVE mg/dL   Nitrite NEGATIVE NEGATIVE   Leukocytes, UA NEGATIVE NEGATIVE    Comment: Performed at [place] [address] [place], [place] [address]  CBC with Differential     Status: None   Collection Time: 10/02/17  5:30 PM  Result Value Ref Range   WBC 9.3 4.0 - 10.5 K/uL   RBC 5.11 4.22 - 5.81 MIL/uL   Hemoglobin 15.1 13.0 - 17.0 g/dL   HCT 44.4 39.0 - 52.0 %   MCV 86.9 78.0 - 100.0 fL   MCH 29.5 26.0 - 34.0 pg  MCHC 34.0 30.0 - 36.0 g/dL   RDW 15.4 11.5 - 15.5 %   Platelets 264 150 - 400 K/uL   Neutrophils Relative % 74 %   Neutro Abs 6.8 1.7 - 7.7 K/uL   Lymphocytes Relative 21 %   Lymphs Abs 1.9 0.7 - 4.0 K/uL   Monocytes Relative 5 %   Monocytes Absolute 0.5 0.1 - 1.0 K/uL   Eosinophils Relative 0 %   Eosinophils Absolute 0.0 0.0 - 0.7 K/uL   Basophils Relative 0 %   Basophils Absolute 0.0 0.0 - 0.1 K/uL    Comment: Performed at [facility], [place] [address], [place], [place] [address]  Comprehensive metabolic panel     Status: Abnormal   Collection Time: 10/02/17  5:30 PM  Result Value Ref Range   Sodium 139 135 - 145 mmol/L   Potassium 3.5 3.5 - 5.1 mmol/L   Chloride 104 98 - 111 mmol/L    Comment: Please note change in reference range.   CO2 24 22 - 32 mmol/L   Glucose, Bld 150 (H) 70 - 99 mg/dL    Comment: Please note change in reference range.   BUN 11 6 - 20 mg/dL    Comment: Please note change in reference range.   Creatinine, Ser 0.74 0.61 - 1.24 mg/dL   Calcium 9.1 8.9 - 10.3 mg/dL   Total Protein 7.6 6.5 - 8.1 g/dL   Albumin 3.8 3.5 - 5.0 g/dL   AST 18 15 - 41 U/L   ALT 26 0 - 44 U/L    Comment: Please note change in reference range.   Alkaline Phosphatase 66 38 - 126 U/L   Total Bilirubin 0.8 0.3 - 1.2 mg/dL   GFR calc non Af Amer >60 >60 mL/min   GFR calc Af Amer >60 >60 mL/min    Comment: (NOTE) The eGFR has been calculated using the CKD EPI equation. This calculation has not been validated in all clinical situations. eGFR's persistently <60 mL/min signify possible Chronic Kidney Disease.    Anion gap 11 5 - 15    Comment: Performed at [facility], [place] [address], [place], [place] [address]    Blood Alcohol level:  Lab Results  Component Value Date   ETH <10 09/27/2017   ETH <5 82/95/6213    Metabolic Disorder Labs: Lab Results  Component Value Date   HGBA1C 5.4 04/01/2014   MPG 108 04/01/2014   No results found for: PROLACTIN Lab Results  Component Value Date   CHOL 187 04/01/2014   TRIG 225 (H) 04/01/2014   HDL 42 04/01/2014   CHOLHDL 4.5 04/01/2014   VLDL 45 (H) 04/01/2014   LDLCALC 100 (H) 04/01/2014   LDLCALC  03/12/2008    97        Total Cholesterol/HDL:CHD Risk Coronary Heart Disease Risk Table                     Men   Women  1/2 Average Risk   3.4   3.3    Physical Findings: AIMS: Facial and Oral Movements Muscles of Facial Expression:  None, normal Lips and Perioral Area: None, normal Jaw: None, normal Tongue: None, normal,Extremity Movements Upper (arms, wrists, hands, fingers): None, normal Lower (legs, knees, ankles, toes): None, normal, Trunk Movements Neck, shoulders, hips: None, normal, Overall Severity Severity of abnormal movements (highest score from questions above): None, normal Incapacitation due to abnormal movements: None, normal Patient's awareness of abnormal  movements (rate only patient's report): No Awareness, Dental Status Current problems with teeth and/or dentures?: No Does patient usually wear dentures?: No  CIWA:  CIWA-Ar Total: 5 COWS:  COWS Total Score: 2  Musculoskeletal: Strength & Muscle Tone: within normal limits Gait & Station: normal Patient leans: N/A  Psychiatric Specialty Exam: Physical Exam  Nursing note and vitals reviewed. Constitutional: No distress.  Lethargic   Neurological: He is alert.  Oriented to self, psychomotor retardation    Psychiatric:  Psychomotor retardation and delirious.     Review of Systems  Psychiatric/Behavioral: Positive for depression.  All other systems reviewed and are negative.   Blood pressure 119/80, pulse 99, temperature 98.2 F (36.8 C), temperature source Oral, resp. rate 20, height 5' 7"  (1.702 m), weight 95.3 kg (210 lb), SpO2 97 %.Body mass index is 32.89 kg/m.  General Appearance: Disheveled cooperative   Eye Contact:  Minimal  Speech:  Garbled  Volume:  Decreased  Mood:  Depressed  Affect:  Blunt and Depressed  Thought Process:  Disorganized and Descriptions of Associations: Loose  Orientation:  Other:  self only  Thought Content:  Illogical and Hallucinations: None - does not appear to be responding to internal stimuli  Suicidal Thoughts:  No Unable to assess today  Homicidal Thoughts:  No Unable to assess today  Memory:  Recent;   Fair Remote;   Fair  Unable to assess today  Judgement:  Fair Unable to assess today   Insight:  Fair Unable to assess today  Psychomotor Activity:  Psychomotor Retardation  Concentration:  Concentration: Fair and Attention Span: Fair  Recall:  Good Unable to assess today  Fund of Knowledge:  Good Unable to assess today  Language:  Good Unable to assess today  Akathisia:  No  AIMS (if indicated):     Assets:  Social Support  ADL's:  Intact  Cognition:  WNL  Sleep:  Number of Hours: 3.25    Treatment Plan Summary: Daily contact with patient to assess and evaluate symptoms and progress in treatment and Medication management    -Will continue with current treatment plan on  10/02/17  plan as below except where it is noted.  Sent to ED for altered mental status from baseline in setting of UTI and possible delirium vs. Neurological process given change seen on head CT.   -Psychosis            - Continue haldol 5 mg HS will consider holding medication if UTI symptoms worsen   -Depression  -Continue Cymbalta 30 mg HS  -Delirium  -Continue Keflex 500 mg Q 8hours for UTI  -Anxiety / CIWA                     -continue Atarax 25 mg po q6h prn anxiety  -continue multivitamin and B-1 100 mg QD   - Other medical   - Prinvil 10 mg QD for HTN  - Continue Sudafed 30 mg Q 6 hours for sinus/ear congestion  - restarted Flomax 0.35m for BPH  Will continue to monitor vitals ,medication compliance and treatment side effects while patient is here.   Reviewed labs  -Encourage participation in groups and therapeutic milieu -Disposition planning will be ongoing    SLavella Hammock MD 10/02/2017, 6:37 PM

## 2017-10-02 NOTE — ED Notes (Signed)
[facility]

## 2017-10-02 NOTE — ED Notes (Signed)
Bed: EA54WA30 Expected date:  Expected time:  Means of arrival:  Comments: [name]

## 2017-10-02 NOTE — Progress Notes (Signed)
Patient ID: Walter SonsDavid B Guidroz, male   DOB: 08/27/1955, 62 y.o.   MRN: [id number] D: Patient return to unit from ED around bedtime. Pt is slow to follow command and is oriented x 3. Pt has a delayed response but is able to answer some of writer's questions. Pt given meal which he ate 100%. Pt called his wife but sounded incoherent on the phone. Pt able to come up to med window for medication and walk back to his room without assistance. Pt denies pain.    A: Medications administered as prescribed. Support and encouragement provided as needed.   R: Patient remains safe and complaint with medications. Will continue to monitor  for safety and stability.

## 2017-10-02 NOTE — ED Notes (Signed)
[facility]. Pt is not suicidal or suicide risk.

## 2017-10-02 NOTE — Discharge Summary (Addendum)
Physician Discharge Summary Note  Patient:  Walter Holder is an 62 y.o., male MRN:  [id number] DOB:  [birth year] Patient phone:  [phone] (home)  Patient address:   [address] [place] [address],   Total Time spent with patient: Greater than 30 minutes  Date of Admission:  09/27/2017 Date of Discharge: 10/07/14  Reason for Admission. Drug withdrawal induced seizure activities & hallucinations.  Discharge Diagnoses: Principal Problem:   Major depressive disorder, recurrent, severe with psychotic features ([place]) Active Problems:   Opiate dependence ([place])   Benzodiazepine abuse, episodic ([place])   Delirium due to another medical condition  Psychiatric Specialty Exam: Physical Exam  Nursing note and vitals reviewed. Constitutional: He appears well-developed.  HENT:  Head: Normocephalic.  Eyes: Pupils are equal, round, and reactive to light.  Neck: Normal range of motion.  Respiratory: Effort normal.  Genitourinary:  Genitourinary Comments: Stable  Musculoskeletal: Normal range of motion.  Neurological: He is alert.  Skin: Skin is warm.  Psychiatric: His speech is normal and behavior is normal. Judgment and thought content normal. His mood appears not anxious. His affect is not angry, not blunt, not labile and not inappropriate. Cognition and memory are normal. He does not exhibit a depressed mood.    Review of Systems  Constitutional: Negative.   HENT: Negative.   Eyes: Negative.   Respiratory: Negative.   Cardiovascular: Negative.   Gastrointestinal: Negative.   Genitourinary: Negative.   Skin: Negative.   Neurological: Negative.   Endo/Heme/Allergies: Negative.   Psychiatric/Behavioral: Positive for depression (Stable). Negative for hallucinations, memory loss, substance abuse and suicidal ideas. The patient has insomnia (Stable). The patient is not nervous/anxious.     See Md's SRA   Musculoskeletal: Strength & Muscle Tone: within normal limits Gait &  Station: normal Patient leans: N/A  Level of Care:  OP  Hospital Course: (Per Md's admission notes): 62 yo male brought to the ED by his family for AMS after a seizure, most likely caused by Xanax withdrawal (2 mg TID)which he must have obtained from the streets since the PMP indicates no refill history for Xanax (PMP does indicate recent prescriptions for Buprenorphine). After the seizure, he was having hallucinations but denied on assessment this morning. This afternoon he did tell this provider that he is hearing voices that I am coming toward him which is scaring him. Psychosis in the past but none lately. Noted to have a UTI. Does admit to using many Xanax and feels his seizure was related to withdrawal symptoms. Depressed and anxious, slow to respond in his room, thought blocking present, not sure about how much cognitive processing is working at this time; I.e Not sure he comprehends questions.  After the above admission, Walter Holder's presenting symptoms were noted. The medication regimen targeting those symptoms were discussed & initiated. He received & was discharged on; Duloxetine 30 mg for depression, hydroxyzine 25 mg prn for anxiety & Seroquel 100 mg for mood control. He presented other significant pre-existing medical issues that required treatment & or monitoring. He was resumed/discharged on all his pertinent home medications for health issues. He tolerated his treatment regimen without any adverse effects or reactions reported. He was enrolled & participated in the group counseling sessions being offered & held on this unit. He learned coping skills.  Walter Holder is seen today by the attending psychiatrist for discharge. He has normal anxiety about going home as he has been in this hospital for a good number of days. He is not overwhelmed by  this. He is looking forward to working on his mental health issues. Not expressing any delusions today. No hallucination. Feels in control of himself. No  fantasy about suicide lately. No suicidal thoughts. No thoughts of violence. No craving for drugs. Does not feel depressed. No evidence of mania.   The nursing staff reports that patient has been appropriate on the unit. Patient has been interacting well with peers. No behavioral issues. Patient has not voiced any suicidal thoughts. Patient has not been observed to be internally stimulated or preoccupied. Patient has been adherent with treatment recommendations. Patient has been tolerating their medication well. No reported adverse effects or reactions.    Patient was discussed at the treatment team meeting this morning. Team members feels that patient is back to his baseline level of function. Team agrees with plan to discharge patient today to continue mental health care on an outpatient basis as noted below. He left [facility] with all personal belongings in no apparent distress.      Upon discharge, he adamantly denies any suicidal, homicidal ideations, auditory, visual hallucinations, delusional thoughts, paranoia and or withdrawal symptoms. He left [facility] with all personal belongings in no apparent distress. He received 14 days worth supply samples of his [facility] discharge medications provided by [facility] pharmacy.   Discharge Vitals:   Blood pressure 117/81, pulse (!) 105, temperature 98.3 F (36.8 C), temperature source Oral, resp. rate 16, height 5' 7" (1.702 m), weight 95.3 kg (210 lb), SpO2 96 %. Body mass index is 32.89 kg/m.  Lab Results:   Results for orders placed or performed during the hospital encounter of 09/27/17 (from the past 72 hour(s))  Rapid urine drug screen (hospital performed)     Status: Abnormal   Collection Time: 10/03/17 12:11 PM  Result Value Ref Range   Opiates NONE DETECTED NONE DETECTED   Cocaine NONE DETECTED NONE DETECTED   Benzodiazepines NONE DETECTED NONE DETECTED   Amphetamines NONE DETECTED NONE DETECTED   Tetrahydrocannabinol POSITIVE (A) NONE DETECTED   Barbiturates  (A) NONE DETECTED    Result not available. Reagent lot number recalled by manufacturer.    Comment: Performed at [facility], [address] [place], [place] [address]   Physical Findings: AIMS: Facial and Oral Movements Muscles of Facial Expression: None, normal Lips and Perioral Area: None, normal Jaw: None, normal Tongue: None, normal,Extremity Movements Upper (arms, wrists, hands, fingers): None, normal Lower (legs, knees, ankles, toes): None, normal, Trunk Movements Neck, shoulders, hips: None, normal, Overall Severity Severity of abnormal movements (highest score from questions above): None, normal Incapacitation due to abnormal movements: None, normal Patient's awareness of abnormal movements (rate only patient's report): No Awareness, Dental Status Current problems with teeth and/or dentures?: No Does patient usually wear dentures?: No  CIWA:  CIWA-Ar Total: 0 COWS:  COWS Total Score: 2  Psychiatric Specialty Exam: See Psychiatric Specialty Exam and Suicide Risk Assessment completed by Attending Physician prior to discharge.  Discharge destination:  Home  Is patient on multiple antipsychotic therapies at discharge:  No   Has Patient had three or more failed trials of antipsychotic monotherapy by history:  No  Recommended Plan for Multiple Antipsychotic Therapies: NA  Allergies as of 10/06/2017   No Known Allergies     Medication List    STOP taking these medications   Melatonin 3 MG Tabs   vitamin B-12 1000 MCG tablet Commonly known as:  CYANOCOBALAMIN     TAKE these medications     Indication    DULoxetine 30 MG capsule Commonly known as:  CYMBALTA Take 1 capsule (30 mg total) by mouth at bedtime. For depression What changed:  additional instructions  Indication:  Major Depressive Disorder   hydrOXYzine 25 MG tablet Commonly known as:  ATARAX/VISTARIL Take 1 tablet (25 mg) by mouth Q 6 hours as needed: For anxiety  Indication:   Feeling Anxious   lisinopril 10 MG tablet Commonly known as:  PRINIVIL,ZESTRIL Take 1 tablet (10 mg total) by mouth daily. For high blood pressure  Indication:  High Blood Pressure Disorder   QUEtiapine 100 MG tablet Commonly known as:  SEROQUEL Take 1 tablet (100 mg total) by mouth at bedtime. For mood control  Indication:  Mood control   tamsulosin 0.4 MG Caps capsule Commonly known as:  FLOMAX Take 1 capsule (0.4 mg total) by mouth daily after supper. For prostate health  Indication:  Benign Enlargement of Prostate      Follow-up Information    Barr, Julie, NP Follow up on 10/16/2017.   Specialty:  Nurse Practitioner Why:  Monday at 9:30 with Dr Barr for your hospital follow up appointment. Contact information: [facility] [address] [place] [address] [phone]          Follow-up recommendations: Activity:  As tolerated Diet: As recommended by your primary care doctor. Keep all scheduled follow-up appointments as recommended.   Comments: Patient is instructed prior to discharge to: Take all medications as prescribed by his/her mental healthcare provider. Report any adverse effects and or reactions from the medicines to his/her outpatient provider promptly. Patient has been instructed & cautioned: To not engage in alcohol and or illegal drug use while on prescription medicines. In the event of worsening symptoms, patient is instructed to call the crisis hotline, 911 and or go to the nearest ED for appropriate evaluation and treatment of symptoms. To follow-up with his/her primary care provider for your other medical issues, concerns and or health care needs.   Signed: Agnes Nwoko, PMHNp-BC 10/06/2017, 9:08 AM   I have reviewed NP's Note, assessement, diagnosis and plan, and agree. I have also met with patient and completed suicide risk assessment.  Sheila M. Maurer, MD    

## 2017-10-02 NOTE — Progress Notes (Signed)
D: Patient is disoriented and confused.  Patient came back from breakfast and entered a room on the 300 hall to use the bathroom and proceeded to get into bed.  Patient is currently on 500 hall.  He was directed back to his hall.  Spoke with MD and the concern is that he is having acute mental changes.  He was a 1:1 observation for a while due to his confusion.  It seemed to clear a little and today he has decompensated further.  Decision was made to send to ED for further evaluation and labs per MD order.  Patient is cooperative, pleasant and remains confused and disoriented.  Spoke with wife and she expressed her concerns over his confusion.  A: Continue to monitor medication management and MD orders.  Safety checks completed every 15 minutes per protocol.  Offer support and encouragement as needed.  R: Patient need redirection from staff due disorientation.

## 2017-10-02 NOTE — ED Provider Notes (Signed)
[facility]-EMERGENCY DEPT Provider Note   CSN: [id number] Arrival date & time: 10/02/17  1423     History   Chief Complaint Chief Complaint  Patient presents with  . Medical Clearance  . Altered Mental Status    HPI Walter Holder is a 62 y.o. male.  The history is provided by the patient and medical records.  Altered Mental Status   This is a new problem. The current episode started more than 2 days ago. The problem has been gradually worsening. Associated symptoms include confusion. Pertinent negatives include no somnolence, no seizures, no unresponsiveness, no weakness, no agitation, no delusions, no hallucinations, no self-injury and no violence. Risk factors include a change in prescription and illicit drug use (Marijuana on UDS.).    Past Medical History:  Diagnosis Date  . Anginal pain (HCC) ~ 2007  . Arthritis    "might be getting some" (02/28/2012)  . C. difficile diarrhea ~ 2008; 02/28/2012  . Chronic lower back pain   . Complication of anesthesia    "my ears rang before I went out" (02/28/2012)  . GERD (gastroesophageal reflux disease)   . Stress at home    "from my daughter mostly" (02/28/2012)    Patient Active Problem List   Diagnosis Date Noted  . Delirium due to another medical condition 09/28/2017  . Benign prostatic hyperplasia 08/22/2014  . Abscess and cellulitis 08/14/2014  . Cellulitis of right lower extremity 08/14/2014  . Recurrent major depression-severe (HCC) 03/29/2014  . Major depressive disorder, recurrent, severe with psychotic features (HCC) 03/28/2014  . Opiate dependence (HCC) 03/28/2014  . Benzodiazepine abuse, episodic (HCC) 03/28/2014  . Colitis due to Clostridium difficile 02/29/2012    Class: Acute  . Hypokalemia 02/29/2012    Class: Acute  . Low back pain 02/29/2012    Class: Chronic  . Acute infective gastroenteritis 02/28/2012  . Dehydration 02/28/2012  . Tachycardia 02/28/2012  . Heme positive  stool 02/28/2012    Past Surgical History:  Procedure Laterality Date  . CARDIAC CATHETERIZATION  ~ 2007  . MULTIPLE TOOTH EXTRACTIONS  1990's   "3" (02/28/2012)        Home Medications    Prior to Admission medications   Medication Sig Start Date End Date Taking? Authorizing Provider  cephALEXin (KEFLEX) 500 MG capsule Take 1 capsule (500 mg total) by mouth every 8 (eight) hours. For infection 10/02/17   Armandina StammerNwoko, Agnes I, NP  DULoxetine (CYMBALTA) 30 MG capsule Take 30 mg by mouth at bedtime.    [provider]  DULoxetine (CYMBALTA) 30 MG capsule Take 1 capsule (30 mg total) by mouth at bedtime. For depression 10/02/17   Armandina StammerNwoko, Agnes I, NP  haloperidol (HALDOL) 5 MG tablet Take 1 tablet (5 mg total) by mouth at bedtime. For mood control 10/02/17   Armandina StammerNwoko, Agnes I, NP  hydrOXYzine (ATARAX/VISTARIL) 25 MG tablet Take 1 tablet (25 mg) by mouth Q 6 hours as needed: For anxiety 10/02/17   Armandina StammerNwoko, Agnes I, NP  lisinopril (PRINIVIL,ZESTRIL) 10 MG tablet Take 1 tablet (10 mg total) by mouth daily. For high blood pressure 10/03/17   Nwoko, Nicole KindredAgnes I, NP  Melatonin 3 MG TABS Take 3-9 mg by mouth at bedtime as needed (SLEEP).    [provider]  tamsulosin (FLOMAX) 0.4 MG CAPS capsule Take 1 capsule (0.4 mg total) by mouth daily after supper. For prostate health 10/02/17   Armandina StammerNwoko, Agnes I, NP  vitamin B-12 (CYANOCOBALAMIN) 1000 MCG tablet Take 1,000 mcg  by mouth daily.    [provider]    Family History Family History  Problem Relation Age of Onset  . Alzheimer's disease Mother   . Diabetes Mellitus II Neg Hx     Social History Social History   Tobacco Use  . Smoking status: Never Smoker  . Smokeless tobacco: Never Used  Substance Use Topics  . Alcohol use: Not Currently    Comment: 02/28/2012 "5-6 beers on the weekends" 08/22/14 none.  . Drug use: Yes    Types: Marijuana, LSD, Benzodiazepines, Cocaine    Comment: 02/28/2012 "back in the 1970's"      Allergies     Patient has no known allergies.   Review of Systems Review of Systems  Constitutional: Negative for activity change, chills, diaphoresis, fatigue and fever.  HENT: Negative for congestion and rhinorrhea.   Eyes: Negative for visual disturbance.  Respiratory: Negative for cough, chest tightness, shortness of breath, wheezing and stridor.   Cardiovascular: Negative for chest pain, palpitations and leg swelling.  Gastrointestinal: Negative for abdominal distention, abdominal pain, blood in stool, constipation, diarrhea, nausea and vomiting.  Genitourinary: Negative for difficulty urinating, dysuria, flank pain and frequency.  Musculoskeletal: Negative for back pain, gait problem, neck pain and neck stiffness.  Skin: Negative for rash and wound.  Neurological: Negative for dizziness, tremors, seizures, syncope, speech difficulty, weakness, light-headedness, numbness and headaches.  Psychiatric/Behavioral: Positive for confusion. Negative for agitation, hallucinations and self-injury.  All other systems reviewed and are negative.    Physical Exam Updated Vital Signs BP 119/80 (BP Location: Right Arm)   Pulse 99   Temp 98.2 F (36.8 C) (Oral)   Resp 20   Ht 5\' 7"  (1.702 m)   Wt 95.3 kg (210 lb)   SpO2 97%   BMI 32.89 kg/m   Physical Exam  Constitutional: He appears well-developed and well-nourished. No distress.  HENT:  Head: Normocephalic and atraumatic.  Nose: Nose normal.  Mouth/Throat: Oropharynx is clear and moist. No oropharyngeal exudate.  Eyes: Pupils are equal, round, and reactive to light. Conjunctivae and EOM are normal.  Neck: Normal range of motion. Neck supple.  Cardiovascular: Normal rate and regular rhythm.  No murmur heard. Pulmonary/Chest: Effort normal and breath sounds normal. No respiratory distress. He has no wheezes. He has no rales. He exhibits no tenderness.  Abdominal: Soft. There is no tenderness. There is no guarding.  Musculoskeletal: He exhibits  no edema or tenderness.  Lymphadenopathy:    He has no cervical adenopathy.  Neurological: He is alert. He is disoriented. He displays no tremor. No cranial nerve deficit or sensory deficit. He exhibits normal muscle tone. Coordination normal. GCS eye subscore is 4. GCS verbal subscore is 5. GCS motor subscore is 6.  Skin: Skin is warm and dry. Capillary refill takes less than 2 seconds. He is not diaphoretic. No erythema. No pallor.  Psychiatric: He has a normal mood and affect. His speech is delayed. His speech is not rapid and/or pressured, not tangential and not slurred. He is communicative.  Speech was slow and delayed but was not a phasic and was not dysarthric.  Nursing note and vitals reviewed.    ED Treatments / Results  Labs (all labs ordered are listed, but only abnormal results are displayed) Labs Reviewed  COMPREHENSIVE METABOLIC PANEL - Abnormal; Notable for the following components:      Result Value   Glucose, Bld 150 (*)    All other components within normal limits  URINALYSIS, ROUTINE W  REFLEX MICROSCOPIC - Abnormal; Notable for the following components:   APPearance HAZY (*)    Ketones, ur 5 (*)    All other components within normal limits  RAPID URINE DRUG SCREEN, HOSP PERFORMED - Abnormal; Notable for the following components:   Tetrahydrocannabinol POSITIVE (*)    Barbiturates   (*)    Value: Result not available. Reagent lot number recalled by manufacturer.   All other components within normal limits  URINE CULTURE  CK  CBC WITH DIFFERENTIAL/PLATELET  AMMONIA    EKG EKG Interpretation  Date/Time:  Monday October 02 2017 17:08:21 EDT Ventricular Rate:  90 PR Interval:  134 QRS Duration: 84 QT Interval:  350 QTC Calculation: 428 R Axis:   -25 Text Interpretation:  Normal sinus rhythm Normal ECG When compared to prior, no significant changes seen.  No STEMI Confirmed by Theda Belfast ([id number]) on 10/02/2017 6:04:17 PM   Radiology Dg Chest 2  View  Result Date: 10/02/2017 CLINICAL DATA:  Altered mental status. EXAM: CHEST - 2 VIEW COMPARISON:  Chest radiograph March 11, 2008 FINDINGS: Cardiomediastinal silhouette is unremarkable for this low inspiratory examination with crowded vasculature markings. The lungs are clear without pleural effusions or focal consolidations. Trachea projects midline and there is no pneumothorax. Included soft tissue planes and osseous structures are non-suspicious. Moderate spondylosis. IMPRESSION: No active cardiopulmonary disease. Electronically Signed   By: Awilda Metro M.D.   On: 10/02/2017 18:37    Procedures Procedures (including critical care time)  Medications Ordered in ED Medications  hydrOXYzine (ATARAX/VISTARIL) tablet 25 mg (25 mg Oral Given 09/30/17 1330)  loperamide (IMODIUM) capsule 2-4 mg (has no administration in time range)  multivitamin with minerals tablet 1 tablet (1 tablet Oral Given 10/02/17 0844)  ondansetron (ZOFRAN-ODT) disintegrating tablet 4 mg (has no administration in time range)  thiamine (VITAMIN B-1) tablet 100 mg (100 mg Oral Given 10/02/17 0844)  acetaminophen (TYLENOL) tablet 650 mg (650 mg Oral Given 09/29/17 2138)  alum & mag hydroxide-simeth (MAALOX/MYLANTA) 200-200-20 MG/5ML suspension 30 mL (has no administration in time range)  magnesium hydroxide (MILK OF MAGNESIA) suspension 30 mL (has no administration in time range)  cephALEXin (KEFLEX) capsule 500 mg (500 mg Oral Given 10/02/17 1720)  DULoxetine (CYMBALTA) DR capsule 30 mg (30 mg Oral Given 10/01/17 2214)  lisinopril (PRINIVIL,ZESTRIL) tablet 10 mg (10 mg Oral Given 10/02/17 0844)  haloperidol (HALDOL) tablet 5 mg (5 mg Oral Given 10/01/17 2214)  pseudoephedrine (SUDAFED) tablet 30 mg (has no administration in time range)  tamsulosin (FLOMAX) capsule 0.4 mg (0.4 mg Oral Given 10/02/17 2059)  hydrOXYzine (ATARAX/VISTARIL) tablet 25 mg (has no administration in time range)  loperamide (IMODIUM) capsule 2-4  mg (has no administration in time range)  ondansetron (ZOFRAN-ODT) disintegrating tablet 4 mg (has no administration in time range)     Initial Impression / Assessment and Plan / ED Course  I have reviewed the triage vital signs and the nursing notes.  Pertinent labs & imaging results that were available during my care of the patient were reviewed by me and considered in my medical decision making (see chart for details).     Walter Holder is a 62 y.o. male with a past medical history significant for GERD and recent admission to [facility] for psychosis who presents from behavioral health for confusion and altered mental status.  According to dog mentation, the patient was going to be discharged this morning however on reassessment before discharge, they felt patient was acting more confused and needed  to go to the emergency department for medical evaluation.  According to their hospital course/discharge summary, patient was found to have psychosis felt to be due to benzodiazepine abuse and withdrawal.  This morning it appears they felt patient was stable and safe for discharge home.  Dog additional shows the patient had a UTI which was treated and symptoms improved.  On arrival today, patient reports feeling confused.  He is unsure when this confusion began.  He denies any headache, vision changes, fevers, chills, neck pain, neck stiffness, numbness, tingling, weakness of extremities, coordination problems, chest pain, palpitations, shortness of breath, cough, congestion, abdominal pain, nausea, vomiting, constipation, diarrhea, dysuria, injuries, or rashes.  He denies all symptoms aside from "feeling confused.  On my exam, patient moves all extremities appropriately.  He had normal sensation and strength in all extremities.  No sensation of normality's.  Speech was slow but clear.  Patient had no dysarthria.  No evidence of a aphasia.  Patient had normal extraocular movements and pupils are reactive  bilaterally.  Normal neck range of motion with no rigidity.  Patient had no facial droop.  Patient's lungs were clear and chest was nontender.  Abdomen was nontender.  Extremities are nontender.  Patient appears well.  Clinically is unclear the etiology of patient's symptoms.  Given the patient's recent changes in medications, will consider poly-pharmacy contributing however patient was recently treated for urinary tract infection.  Will reassess her urinalysis and UDS.  With history of recent admission, will obtain chest x-ray to look for occult pneumonia and will have laboratory testing for a metabolic or electrolyte cause of his symptoms.  Chart shows the patient had 2- head CTs during recent admission and with lack of focal neurologic deficits or any stroke symptoms I have a lower concern for a cerebrovascular cause of his symptoms.  Patient will have screening and then TTS will likely be recalled.  9:45 PM Patient's laboratory work-up was overall reassuring.  Ammonia was not elevated.  CBC and CMP reassuring.  Urinalysis did not show evidence of continued urinary tract infection.  UDS was now positive for cannabis which was negative when he was admitted to be [facility], raising concern for patient using cannabis during his admission which could have contributed to his altered mental status and psychosis. Chest x-ray negative for active cardia pulmonary disease.  Given reassuring work-u and reassuring exam, do not feel patient has a medical/metabolic cause of his symptoms at this time.  I do not think he had a stroke.  I am concerned the patient's use of cannabis as well as his new medication combination may have contributed to his altered mental status today.  During his stay here his mental status appears to improved and he answered questions better on my reassessment.  He continues to deny other symptoms and agrees with transfer back to his [facility] facility to continue his management there.  Patient had no  depressions or concerns and agreed with transfer back.    Patient will be sent back to [facility].   Final Clinical Impressions(s) / ED Diagnoses   Final diagnoses:  Altered mental status, unspecified altered mental status type  Confusion    ED Discharge Orders        Ordered    lisinopril (PRINIVIL,ZESTRIL) 10 MG tablet  Daily     10/02/17 1034    DULoxetine (CYMBALTA) 30 MG capsule  Daily at bedtime     10/02/17 1034    cephALEXin (KEFLEX) 500 MG capsule  Every  8 hours     10/02/17 1034    haloperidol (HALDOL) 5 MG tablet  Daily at bedtime     10/02/17 1034    hydrOXYzine (ATARAX/VISTARIL) 25 MG tablet     10/02/17 1034    tamsulosin (FLOMAX) 0.4 MG CAPS capsule  Daily after supper     10/02/17 1034      Clinical Impression: 1. Altered mental status, unspecified altered mental status type   2. Confusion     Disposition: Transfer back to [facility]  Condition: Good    Current Discharge Medication List    START taking these medications   Details  cephALEXin (KEFLEX) 500 MG capsule Take 1 capsule (500 mg total) by mouth every 8 (eight) hours. For infection Qty: 15 capsule, Refills: 0    haloperidol (HALDOL) 5 MG tablet Take 1 tablet (5 mg total) by mouth at bedtime. For mood control Qty: 30 tablet, Refills: 0    hydrOXYzine (ATARAX/VISTARIL) 25 MG tablet Take 1 tablet (25 mg) by mouth Q 6 hours as needed: For anxiety Qty: 60 tablet, Refills: 0    lisinopril (PRINIVIL,ZESTRIL) 10 MG tablet Take 1 tablet (10 mg total) by mouth daily. For high blood pressure    tamsulosin (FLOMAX) 0.4 MG CAPS capsule Take 1 capsule (0.4 mg total) by mouth daily after supper. For prostate health Qty: 1 capsule        Follow Up: Marletta Lor, NP [facility] [address] [place] [address] [phone]  Follow up on 10/16/2017 Monday at 9:30 with Dr Teola Bradley for your hospital follow up appointment.     Tegeler, Canary Brim, MD 10/03/17 [phone]

## 2017-10-02 NOTE — Progress Notes (Signed)
Recreation Therapy Notes  Date: 7.15.19 Time: 1000 Location: 500 Dayroom  Group Topic: Coping Skills  Goal Area(s) Addresses:  Pt will able to identify positive coping skills. Pt will able to identify benefits of using coping skills post d/c.  Intervention: Worksheet    Activity: Technical brewerCoping Skills Mind map.  LRT and patients filled in the first 8 boxes (anger, depression, stress, attitude/low self-esteem, manic, communication, alcohol and isolation) together.  Patients were to then identify 3 coping skills for each situation before coming back together as a group.  Education: PharmacologistCoping Skills, Building control surveyorDischarge Planning.   Education Outcome: Acknowledges understanding/In group clarification offered/Needs additional education.   Clinical Observations/Feedback: Pt did not attend group.    Caroll RancherMarjette Trynity Skousen, LRT/CTRS         Lillia AbedLindsay, Brayant Dorr A 10/02/2017 1:22 PM

## 2017-10-02 NOTE — Progress Notes (Signed)
Recreation Therapy Notes  INPATIENT RECREATION TR PLAN  Patient Details Name: Walter Holder MRN: [id number] DOB: Feb 18, 1956 Today's Date: 10/02/2017  Rec Therapy Plan Is patient appropriate for Therapeutic Recreation?: Yes Treatment times per week: about 3 days Estimated Length of Stay: 5-7 days TR Treatment/Interventions: Group participation (Comment)  Discharge Criteria Pt will be discharged from therapy if:: Discharged Treatment plan/goals/alternatives discussed and agreed upon by:: Patient/family  Discharge Summary Short term goals set: See patient care plan Short term goals met: Not met Reason goals not met: Patient did not attend groups Therapeutic equipment acquired: N/A Reason patient discharged from therapy: Discharge from hospital Pt/family agrees with progress & goals achieved: Yes Date patient discharged from therapy: 10/02/17    Victorino Sparrow, LRT/CTRS  Ria Comment, Rihana Kiddy A 10/02/2017, 11:43 AM

## 2017-10-02 NOTE — ED Triage Notes (Signed)
[facility]. According the staff member who accompanied him, "He was a lot different 6 days ago. He was talking 6 days ago."  This morning he was disoriented as he went into another pt's room on a different hall after breakfast. He appears to have thought blocking, or inability to complete a sentence or thought. He is unable to state his reason for being here. He is able to follow commands. Denies pain. Calm and cooperative.

## 2017-10-02 NOTE — Plan of Care (Signed)
Pt did not attend groups.   Emaan Gary, LRT/CTRS 

## 2017-10-02 NOTE — ED Notes (Signed)
When asked what day it is pt reports March 22, 1955. PERRLA. Strength equal bilateral hands and feet. No arm drift.  No slurred speech.Tongue midline.

## 2017-10-02 NOTE — ED Notes (Signed)
Spoke with charge nurse Matt RN concerning which order sets to use for this pt because this writer is not a Insurance claims handlertriage nurse. Matt RN said that the doctor will put orders in.

## 2017-10-02 NOTE — ED Notes (Signed)
PT TRANSPORTED FOR CHEST X-RAY

## 2017-10-02 NOTE — ED Notes (Signed)
Pt stated "I was supposed to go home today but my wife wanted me to have a CT to see if I was having seizures."

## 2017-10-03 LAB — RAPID URINE DRUG SCREEN, HOSP PERFORMED
AMPHETAMINES: NOT DETECTED
BENZODIAZEPINES: NOT DETECTED
Cocaine: NOT DETECTED
OPIATES: NOT DETECTED
TETRAHYDROCANNABINOL: POSITIVE — AB

## 2017-10-03 MED ORDER — HALOPERIDOL 5 MG PO TABS
10.0000 mg | ORAL_TABLET | Freq: Every day | ORAL | Status: DC
  Administered 2017-10-03: 10 mg via ORAL
  Filled 2017-10-03 (×4): qty 2

## 2017-10-03 NOTE — Plan of Care (Signed)
  Problem: Safety: Goal: Periods of time without injury will increase Outcome: Progressing   Problem: Medication: Goal: Compliance with prescribed medication regimen will improve Outcome: Progressing   Problem: Nutritional: Goal: Ability to achieve adequate nutritional intake will improve Outcome: Progressing  DAR NOTE: Patient presents with flat affect and depressed mood. Patient is alert and oriented to time, place and person.  Continues with antibiotic therapy for UTI.  Food and fluid intake encouraged and is adequate.  Ambulatory on the unit with steady gait.  Minimal interaction with staff.  Denies suicidal thoughts, auditory and visual hallucinations.  Rates depression at 9, hopelessness at 9, and anxiety at 9.  Maintained on routine safety checks.  Medications given as prescribed.  Support and encouragement offered as needed.  Attended group and participated.  Patient is safe off and on the unit.  Able to follow direction on the unit.  Offered no complaint.

## 2017-10-03 NOTE — Progress Notes (Signed)
Recreation Therapy Notes  Date: 7.16.19 Time: 1000 Location: 500 Hall Dayroom  Group Topic: Anger Management  Goal Area(s) Addresses:  Patient will identify triggers for anger.  Patient will identify physical reaction to anger.   Patient will identify benefit of using coping skills when angry.  Behavioral Response: Minimal  Intervention: Worksheet  Activity: Introduction to Anger Management.  Patients were to identify 3 situations, topics or people that lead to feelings of anger; what they do when angry and the problems they have run into because of anger.  Education: Anger Management, Discharge Planning   Education Outcome: Acknowledges education/In group clarification offered/Needs additional education.   Clinical Observations/Feedback: Pt was flat and quiet but engaged when prompted.  Pt expressed his anger is caused by his "daughter, son and my habit".  Pt stated "myself" as a reaction to anger.  Pt stated his anger hasn't really cost him anything.    Caroll RancherMarjette Jarielys Girardot, LRT/CTRS          Caroll RancherLindsay, Daeveon Zweber A 10/03/2017 11:48 AM

## 2017-10-03 NOTE — [facility] Group Notes (Signed)
LCSW Group Therapy Note   10/03/2017 1:15pm   Type of Therapy and Topic:  Group Therapy:  Positive Affirmations   Participation Level:  Minimal  Description of Group: This group addressed positive affirmation toward self and others. Patients went around the room and identified two positive things about themselves and two positive things about a peer in the room. Patients reflected on how it felt to share something positive with others, to identify positive things about themselves, and to hear positive things from others. Patients were encouraged to have a daily reflection of positive characteristics or circumstances.  Therapeutic Goals 1. Patient will verbalize two of their positive qualities 2. Patient will demonstrate empathy for others by stating two positive qualities about a peer in the group 3. Patient will verbalize their feelings when voicing positive self affirmations and when voicing positive affirmations of others 4. Patients will discuss the potential positive impact on their wellness/recovery of focusing on positive traits of self and others. Summary of Patient Progress:  Stayed the entire time. Unable to tell if he was engaged-staring.  When asked directly, he was able to answer with delayed responses, appeared to be thought blocking-but subject related.  Cites his passion as playing guitar, and states he has been doing it since childhood.  Was with a group at one point, and wishes he had his guitar here.  Therapeutic Modalities Cognitive Behavioral Therapy Motivational Interviewing  Ida RogueRodney B Tahja Liao, [place] 10/03/2017 1:58 PM

## 2017-10-03 NOTE — Progress Notes (Signed)
Adult Psychoeducational Group Note  Date:  10/03/2017 Time:  8:45 PM  Group Topic/Focus:  Wrap-Up Group:   The focus of this group is to help patients review their daily goal of treatment and discuss progress on daily workbooks.  Participation Level:  Minimal  Participation Quality:  Inattentive  Affect:  Flat  Cognitive:  Confused  Insight: Limited  Engagement in Group:  Monopolizing  Modes of Intervention:  Discussion  Additional Comments:  The patient attended group.  Octavio Mannshigpen, Deyon Chizek Lee 10/03/2017, 8:45 PM

## 2017-10-03 NOTE — Progress Notes (Signed)
[facility], [place] MD Progress Note  10/03/2017 5:31 PM Walter Holder  MRN:  [id number]    History: Per assessment note 62 yo male brought to the ED by his family for AMS after a seizure, most likely caused by Xanax withdrawal (2 mg TID)which he must have obtained from the streets since the PMP indicates no refill history for Xanax (PMP does indicate recent prescriptions for Buprenorphine). After the seizure, he was having hallucinations but denied on assessment this morning. This afternoon he did tell this provider that he is hearing voices that I am coming toward him which is scaring him. Psychosis in the past but none lately. Noted to have a UTI. Does admit to using many Xanax and feels his seizure was related to withdrawal symptoms. Depressed and anxious, slow to respond in his room, thought blocking present, not sure about how much cognitive processing is working at this time; I.e Not sure he comprehends questions. The pt is not a good historian.  Walter Holder  is seen, chart reviewed. The chart findings discussed with the treatment team. Walter Holder seen resting today, and is out in day room.  He is slow to respond but.  He is oriented to person and place and years with clues. He is able to shower and dress with assistance and attempts groups. He is unable to answer questions regarding AVH, homicidal or suicidal thought during this assessment. Support encouragement reassurance was provided.  Reviewed head CT's from 09/24/2017 to 09/27/2017 with noted change.  Neurology consult requested, and transport to ED arranged for further medical work-up in setting of delirium.    10/03/2017 Spoke with wife who states that he has been depressed due to father and sister died in past year, his band broke up. Reviewed THC on UDS per ED labs.   Reviewed about  Principal Problem: Major depressive disorder, recurrent, severe with psychotic features ([place]) Diagnosis:   Patient Active Problem List   Diagnosis Date Noted  .  Delirium due to another medical condition [F05] 09/28/2017  . Benign prostatic hyperplasia [N40.0] 08/22/2014  . Abscess and cellulitis [L03.90, L02.91] 08/14/2014  . Cellulitis of right lower extremity [L03.115] 08/14/2014  . Recurrent major depression-severe ([place]) [F33.2] 03/29/2014  . Major depressive disorder, recurrent, severe with psychotic features ([place]) [F33.3] 03/28/2014  . Opiate dependence ([place]) [F11.20] 03/28/2014  . Benzodiazepine abuse, episodic ([place]) [F13.10] 03/28/2014  . Colitis due to Clostridium difficile [A04.72] 02/29/2012    Class: Acute  . Hypokalemia [E87.6] 02/29/2012    Class: Acute  . Low back pain [M54.5] 02/29/2012    Class: Chronic  . Acute infective gastroenteritis [A09] 02/28/2012  . Dehydration [E86.0] 02/28/2012  . Tachycardia [R00.0] 02/28/2012  . Heme positive stool [R19.5] 02/28/2012   Total Time spent with patient: 45 minutes   Past Medical History:  Past Medical History:  Diagnosis Date  . Anginal pain ([place]) ~ 2007  . Arthritis    "might be getting some" (02/28/2012)  . C. difficile diarrhea ~ 2008; 02/28/2012  . Chronic lower back pain   . Complication of anesthesia    "my ears rang before I went out" (02/28/2012)  . GERD (gastroesophageal reflux disease)   . Stress at home    "from my daughter mostly" (02/28/2012)    Past Surgical History:  Procedure Laterality Date  . CARDIAC CATHETERIZATION  ~ 2007  . MULTIPLE TOOTH EXTRACTIONS  1990's   "3" (02/28/2012)   Family History:  Family History  Problem Relation Age of Onset  .  Alzheimer's disease Mother   . Diabetes Mellitus II Neg Hx    Family Psychiatric  History: see H&P Social History:  Social History   Substance and Sexual Activity  Alcohol Use Not Currently   Comment: 02/28/2012 "5-6 beers on the weekends" 08/22/14 none.     Social History   Substance and Sexual Activity  Drug Use Yes  . Types: Marijuana, LSD, Benzodiazepines, Cocaine   Comment: 02/28/2012 "back in  the 1970's"     Social History   Socioeconomic History  . Marital status: Married    Spouse name: Not on file  . Number of children: Not on file  . Years of education: Not on file  . Highest education level: Not on file  Occupational History  . Not on file  Social Needs  . Financial resource strain: Not on file  . Food insecurity:    Worry: Not on file    Inability: Not on file  . Transportation needs:    Medical: Not on file    Non-medical: Not on file  Tobacco Use  . Smoking status: Never Smoker  . Smokeless tobacco: Never Used  Substance and Sexual Activity  . Alcohol use: Not Currently    Comment: 02/28/2012 "5-6 beers on the weekends" 08/22/14 none.  . Drug use: Yes    Types: Marijuana, LSD, Benzodiazepines, Cocaine    Comment: 02/28/2012 "back in the 1970's"   . Sexual activity: Yes  Lifestyle  . Physical activity:    Days per week: Not on file    Minutes per session: Not on file  . Stress: Not on file  Relationships  . Social connections:    Talks on phone: Not on file    Gets together: Not on file    Attends religious service: Not on file    Active member of club or organization: Not on file    Attends meetings of clubs or organizations: Not on file    Relationship status: Not on file  Other Topics Concern  . Not on file  Social History Narrative  . Not on file   Additional Social History:     See H&P                    Sleep: Poor  Appetite:  Fair  Current Medications: Current Facility-Administered Medications  Medication Dose Route Frequency Provider Last Rate Last Dose  . acetaminophen (TYLENOL) tablet 650 mg  650 mg Oral Q6H PRN Patrecia Pour, NP   650 mg at 09/29/17 2138  . alum & mag hydroxide-simeth (MAALOX/MYLANTA) 200-200-20 MG/5ML suspension 30 mL  30 mL Oral Q4H PRN Patrecia Pour, NP      . cephALEXin (KEFLEX) capsule 500 mg  500 mg Oral Q8H Patrecia Pour, NP   500 mg at 10/03/17 1516  . DULoxetine (CYMBALTA) DR capsule 30  mg  30 mg Oral QHS Patrecia Pour, NP   30 mg at 10/02/17 2325  . haloperidol (HALDOL) tablet 5 mg  5 mg Oral QHS Lavella Hammock, MD   5 mg at 10/02/17 2325  . lisinopril (PRINIVIL,ZESTRIL) tablet 10 mg  10 mg Oral Daily Patrecia Pour, NP   10 mg at 10/03/17 0827  . loperamide (IMODIUM) capsule 2-4 mg  2-4 mg Oral PRN Patriciaann Clan E, PA-C      . magnesium hydroxide (MILK OF MAGNESIA) suspension 30 mL  30 mL Oral Daily PRN Patrecia Pour, NP      .  multivitamin with minerals tablet 1 tablet  1 tablet Oral Daily Patrecia Pour, NP   1 tablet at 10/03/17 0827  . tamsulosin (FLOMAX) capsule 0.4 mg  0.4 mg Oral QPC supper Derrill Center, NP   0.4 mg at 10/02/17 2059  . thiamine (VITAMIN B-1) tablet 100 mg  100 mg Oral Daily Patrecia Pour, NP   100 mg at 10/03/17 6283    Lab Results:  Results for orders placed or performed during the hospital encounter of 09/27/17 (from the past 48 hour(s))  Urinalysis, Routine w reflex microscopic     Status: Abnormal   Collection Time: 10/02/17  5:00 PM  Result Value Ref Range   Color, Urine YELLOW YELLOW   APPearance HAZY (A) CLEAR   Specific Gravity, Urine 1.016 1.005 - 1.030   pH 5.0 5.0 - 8.0   Glucose, UA NEGATIVE NEGATIVE mg/dL   Hgb urine dipstick NEGATIVE NEGATIVE   Bilirubin Urine NEGATIVE NEGATIVE   Ketones, ur 5 (A) NEGATIVE mg/dL   Protein, ur NEGATIVE NEGATIVE mg/dL   Nitrite NEGATIVE NEGATIVE   Leukocytes, UA NEGATIVE NEGATIVE    Comment: Performed at [facility], [place] [address] [place], [place] [address]  Rapid urine drug screen (hospital performed)     Status: Abnormal   Collection Time: 10/02/17  5:00 PM  Result Value Ref Range   Opiates NONE DETECTED NONE DETECTED   Cocaine NONE DETECTED NONE DETECTED   Benzodiazepines NONE DETECTED NONE DETECTED   Amphetamines NONE DETECTED NONE DETECTED   Tetrahydrocannabinol POSITIVE (A) NONE DETECTED   Barbiturates (A) NONE DETECTED    Result not available.  Reagent lot number recalled by manufacturer.    Comment: Performed at [facility], [place] [address], [place], [place] [address]  CBC with Differential     Status: None   Collection Time: 10/02/17  5:30 PM  Result Value Ref Range   WBC 9.3 4.0 - 10.5 K/uL   RBC 5.11 4.22 - 5.81 MIL/uL   Hemoglobin 15.1 13.0 - 17.0 g/dL   HCT 44.4 39.0 - 52.0 %   MCV 86.9 78.0 - 100.0 fL   MCH 29.5 26.0 - 34.0 pg   MCHC 34.0 30.0 - 36.0 g/dL   RDW 15.4 11.5 - 15.5 %   Platelets 264 150 - 400 K/uL   Neutrophils Relative % 74 %   Neutro Abs 6.8 1.7 - 7.7 K/uL   Lymphocytes Relative 21 %   Lymphs Abs 1.9 0.7 - 4.0 K/uL   Monocytes Relative 5 %   Monocytes Absolute 0.5 0.1 - 1.0 K/uL   Eosinophils Relative 0 %   Eosinophils Absolute 0.0 0.0 - 0.7 K/uL   Basophils Relative 0 %   Basophils Absolute 0.0 0.0 - 0.1 K/uL    Comment: Performed at [facility], [place] [address], [place], [place] [address]  Comprehensive metabolic panel     Status: Abnormal   Collection Time: 10/02/17  5:30 PM  Result Value Ref Range   Sodium 139 135 - 145 mmol/L   Potassium 3.5 3.5 - 5.1 mmol/L   Chloride 104 98 - 111 mmol/L    Comment: Please note change in reference range.   CO2 24 22 - 32 mmol/L   Glucose, Bld 150 (H) 70 - 99 mg/dL    Comment: Please note change in reference range.   BUN 11 6 - 20 mg/dL    Comment: Please note change in reference range.   Creatinine,  Ser 0.74 0.61 - 1.24 mg/dL   Calcium 9.1 8.9 - 10.3 mg/dL   Total Protein 7.6 6.5 - 8.1 g/dL   Albumin 3.8 3.5 - 5.0 g/dL   AST 18 15 - 41 U/L   ALT 26 0 - 44 U/L    Comment: Please note change in reference range.   Alkaline Phosphatase 66 38 - 126 U/L   Total Bilirubin 0.8 0.3 - 1.2 mg/dL   GFR calc non Af Amer >60 >60 mL/min   GFR calc Af Amer >60 >60 mL/min    Comment: (NOTE) The eGFR has been calculated using the CKD EPI equation. This calculation has not been validated in all clinical situations. eGFR's  persistently <60 mL/min signify possible Chronic Kidney Disease.    Anion gap 11 5 - 15    Comment: Performed at [facility], [place] [address] [place], [place] [address]  Ammonia     Status: None   Collection Time: 10/02/17  5:32 PM  Result Value Ref Range   Ammonia 20 9 - 35 umol/L    Comment: Performed at [facility], [place] [address] [place], [place] [address]    Blood Alcohol level:  Lab Results  Component Value Date   ETH <10 09/27/2017   ETH <5 19/62/2297    Metabolic Disorder Labs: Lab Results  Component Value Date   HGBA1C 5.4 04/01/2014   MPG 108 04/01/2014   No results found for: PROLACTIN Lab Results  Component Value Date   CHOL 187 04/01/2014   TRIG 225 (H) 04/01/2014   HDL 42 04/01/2014   CHOLHDL 4.5 04/01/2014   VLDL 45 (H) 04/01/2014   LDLCALC 100 (H) 04/01/2014   LDLCALC  03/12/2008    97        Total Cholesterol/HDL:CHD Risk Coronary Heart Disease Risk Table                     Men   Women  1/2 Average Risk   3.4   3.3    Physical Findings: AIMS: Facial and Oral Movements Muscles of Facial Expression: None, normal Lips and Perioral Area: None, normal Jaw: None, normal Tongue: None, normal,Extremity Movements Upper (arms, wrists, hands, fingers): None, normal Lower (legs, knees, ankles, toes): None, normal, Trunk Movements Neck, shoulders, hips: None, normal, Overall Severity Severity of abnormal movements (highest score from questions above): None, normal Incapacitation due to abnormal movements: None, normal Patient's awareness of abnormal movements (rate only patient's report): No Awareness, Dental Status Current problems with teeth and/or dentures?: No Does patient usually wear dentures?: No  CIWA:  CIWA-Ar Total: 3 COWS:  COWS Total Score: 2  Musculoskeletal: Strength & Muscle Tone: within normal limits Gait & Station: normal Patient leans: N/A  Psychiatric Specialty Exam: Physical Exam   Nursing note and vitals reviewed. Constitutional: He is oriented to person, place, and time. No distress.  Lethargic   Neurological: He is alert and oriented to person, place, and time.  Oriented to self, psychomotor retardation    Psychiatric: He has a normal mood and affect.  Psychomotor retardation.     Review of Systems  Psychiatric/Behavioral: Positive for depression.  All other systems reviewed and are negative.   Blood pressure 126/78, pulse 92, temperature (!) 97.5 F (36.4 C), temperature source Oral, resp. rate 20, height 5' 7"  (1.702 m), weight 95.3 kg (210 lb), SpO2 96 %.Body mass index is 32.89 kg/m.  General Appearance: Disheveled cooperative   Eye Contact:  Minimal  Speech:  Slow  Volume:  Decreased  Mood:  Depressed  Affect:  Blunt and Depressed  Thought Process:  Disorganized and Descriptions of Associations: Loose  Orientation:  Full (Time, Place, and Person)  Thought Content:  Logical and Hallucinations: None - does not appear to be responding to internal stimuli  Suicidal Thoughts:  No   Homicidal Thoughts:  No   Memory:  Recent;   Fair Remote;   Fair    Judgement:  Fair   Insight:  Fair   Psychomotor Activity:  Psychomotor Retardation  Concentration:  Concentration: Fair and Attention Span: Fair  Recall:  Good   Fund of Knowledge:  Good   Language:  Good  Akathisia:  No  AIMS (if indicated):     Assets:  Social Support  ADL's:  Intact  Cognition:  WNL  Sleep:  Number of Hours: 4.5    Treatment Plan Summary: Daily contact with patient to assess and evaluate symptoms and progress in treatment and Medication management    -Will continue with current treatment plan on  10/03/17  plan as below except where it is noted.  Sent to ED for altered mental status from baseline in setting of UTI and possible delirium vs. Neurological process given change seen on head CT.   -Psychosis            - Increase haldol 10 mg HS  -Depression  -Continue  Cymbalta 30 mg HS  -Delirium  -Continue Keflex 500 mg Q 8hours for UTI  -Anxiety / CIWA                     -continue Atarax 25 mg po q6h prn anxiety  -continue multivitamin and B-1 100 mg QD   - Other medical   - Prinvil 10 mg QD for HTN  - restarted Flomax 0.22m for BPH  - discontinued PRN medications that can contribute to delirium.  Patient had not taken.  Will continue to monitor vitals ,medication compliance and treatment side effects while patient is here.   Reviewed labs  -Encourage participation in groups and therapeutic milieu -Disposition planning will be ongoing    SLavella Hammock MD 10/03/2017, 5:31 PM

## 2017-10-03 NOTE — Progress Notes (Signed)
Patient ID: Erin SonsDavid B Soltis, male   DOB: 07/18/1955, 62 y.o.   MRN: [id number] D: Patient sitting in dayroom watching TV and not interacting with peers. Pt mood and affect appeared depressed and flat. Pt is still has delayed response. Pt is oriented to person, place and time. Pt denies pain. Pt attended evening wrap up group but participation was minimal. Cooperative with assessment.    A: Medications administered as prescribed. Support and encouragement provided to attend groups and engage in milieu.   R: Patient remains safe and complaint with medications.

## 2017-10-04 LAB — URINE CULTURE: CULTURE: NO GROWTH

## 2017-10-04 MED ORDER — TRAZODONE HCL 50 MG PO TABS
50.0000 mg | ORAL_TABLET | Freq: Once | ORAL | Status: DC
  Filled 2017-10-04: qty 1

## 2017-10-04 MED ORDER — QUETIAPINE FUMARATE 100 MG PO TABS
100.0000 mg | ORAL_TABLET | Freq: Every day | ORAL | Status: DC
  Administered 2017-10-04 – 2017-10-05 (×2): 100 mg via ORAL
  Filled 2017-10-04 (×5): qty 1

## 2017-10-04 NOTE — Progress Notes (Signed)
Recreation Therapy Notes  Date: 7.17.19 Time: 0950 Location: [address] Dayroom  Group Topic: Self-Esteem  Goal Area(s) Addresses:  Patient will successfully identify positive attributes about themselves.  Patient will successfully identify benefit of improved self-esteem.   Intervention: Construction paper, scissors, glue sticks, magazines, markers, colored pencils  Activity: Collages. Patients were to create a collage that described and highlighted the positive things about them.  Education: Self-Esteem, Building control surveyorDischarge Planning.   Education Outcome: Acknowledges education/In group clarification offered/Needs additional education  Clinical Observations/Feedback: Pt did not attend group.    Caroll RancherMarjette Israel Wunder, LRT/CTRS         Caroll RancherLindsay, Brenna Friesenhahn A 10/04/2017 12:27 PM

## 2017-10-04 NOTE — Tx Team (Signed)
Interdisciplinary Treatment and Diagnostic Plan Update  10/04/2017 Time of Session: 1:50 PM  Walter Holder MRN: [id number]  Principal Diagnosis: Major depressive disorder, recurrent, severe with psychotic features ([place])  Secondary Diagnoses: Principal Problem:   Major depressive disorder, recurrent, severe with psychotic features ([place]) Active Problems:   Opiate dependence ([place])   Benzodiazepine abuse, episodic ([place])   Delirium due to another medical condition   Current Medications:  Current Facility-Administered Medications  Medication Dose Route Frequency Provider Last Rate Last Dose  . acetaminophen (TYLENOL) tablet 650 mg  650 mg Oral Q6H PRN Patrecia Pour, NP   650 mg at 09/29/17 2138  . alum & mag hydroxide-simeth (MAALOX/MYLANTA) 200-200-20 MG/5ML suspension 30 mL  30 mL Oral Q4H PRN Patrecia Pour, NP      . cephALEXin (KEFLEX) capsule 500 mg  500 mg Oral Q8H Patrecia Pour, NP   500 mg at 10/04/17 1333  . DULoxetine (CYMBALTA) DR capsule 30 mg  30 mg Oral QHS Patrecia Pour, NP   30 mg at 10/03/17 2106  . haloperidol (HALDOL) tablet 10 mg  10 mg Oral QHS Lavella Hammock, MD   10 mg at 10/03/17 2105  . lisinopril (PRINIVIL,ZESTRIL) tablet 10 mg  10 mg Oral Daily Patrecia Pour, NP   10 mg at 10/04/17 0818  . magnesium hydroxide (MILK OF MAGNESIA) suspension 30 mL  30 mL Oral Daily PRN Patrecia Pour, NP      . multivitamin with minerals tablet 1 tablet  1 tablet Oral Daily Patrecia Pour, NP   1 tablet at 10/04/17 0818  . tamsulosin (FLOMAX) capsule 0.4 mg  0.4 mg Oral QPC supper Derrill Center, NP   0.4 mg at 10/03/17 1816  . thiamine (VITAMIN B-1) tablet 100 mg  100 mg Oral Daily Patrecia Pour, NP   100 mg at 10/04/17 0818  . traZODone (DESYREL) tablet 50 mg  50 mg Oral Once Lindon Romp A, NP        PTA Medications: Medications Prior to Admission  Medication Sig Dispense Refill Last Dose  . DULoxetine (CYMBALTA) 30 MG capsule Take 30 mg by mouth at bedtime.    Past Week at Unknown time  . Melatonin 3 MG TABS Take 3-9 mg by mouth at bedtime as needed (SLEEP).   Past Week at Unknown time  . vitamin B-12 (CYANOCOBALAMIN) 1000 MCG tablet Take 1,000 mcg by mouth daily.   Past Week at Unknown time    Patient Stressors: Health problems  Patient Strengths: Ability for insight Average or above average intelligence Supportive family/friends  Treatment Modalities: Medication Management, Group therapy, Case management,  1 to 1 session with clinician, Psychoeducation, Recreational therapy.   Physician Treatment Plan for Primary Diagnosis: Major depressive disorder, recurrent, severe with psychotic features ([place]) Long Term Goal(s): Improvement in symptoms so as ready for discharge  Short Term Goals: Ability to identify changes in lifestyle to reduce recurrence of condition will improve Compliance with prescribed medications will improve Ability to identify triggers associated with substance abuse/mental health issues will improve Ability to identify changes in lifestyle to reduce recurrence of condition will improve Compliance with prescribed medications will improve Ability to identify triggers associated with substance abuse/mental health issues will improve  Medication Management: Evaluate patient's response, side effects, and tolerance of medication regimen.  Therapeutic Interventions: 1 to 1 sessions, Unit Group sessions and Medication administration.  Evaluation of Outcomes: Progressing   7/17: Reviewed head CT's from 09/24/2017 to  09/27/2017 with noted change.  Neurology consult requested, and transport to ED arranged for further medical work-up in setting of delirium.  Spoke with wife who states that he has been depressed due to father and sister died in past year, his band broke up. Reviewed THC on UDS per ED labs.  -Psychosis - Increase haldol 10 mg HS  -Depression             -Continue Cymbalta 30 mg HS  -Delirium              -Continue Keflex 500 mg Q 8hours for UTI  -Anxiety/ CIWA -continue Atarax 25 mg po q6h prn anxiety             -continue multivitamin and B-1 100 mg QD     Physician Treatment Plan for Secondary Diagnosis: Principal Problem:   Major depressive disorder, recurrent, severe with psychotic features ([place]) Active Problems:   Opiate dependence ([place])   Benzodiazepine abuse, episodic ([place])   Delirium due to another medical condition   Long Term Goal(s): Improvement in symptoms so as ready for discharge  Short Term Goals: Ability to identify changes in lifestyle to reduce recurrence of condition will improve Compliance with prescribed medications will improve Ability to identify triggers associated with substance abuse/mental health issues will improve Ability to identify changes in lifestyle to reduce recurrence of condition will improve Compliance with prescribed medications will improve Ability to identify triggers associated with substance abuse/mental health issues will improve  Medication Management: Evaluate patient's response, side effects, and tolerance of medication regimen.  Therapeutic Interventions: 1 to 1 sessions, Unit Group sessions and Medication administration.  Evaluation of Outcomes: Progressing   RN Treatment Plan for Primary Diagnosis: Major depressive disorder, recurrent, severe with psychotic features ([place]) Long Term Goal(s): Knowledge of disease and therapeutic regimen to maintain health will improve  Short Term Goals: Ability to identify and develop effective coping behaviors will improve and Compliance with prescribed medications will improve  Medication Management: RN will administer medications as ordered by provider, will assess and evaluate patient's response and provide education to patient for prescribed medication. RN will report any adverse and/or side effects to prescribing provider.  Therapeutic Interventions: 1 on 1 counseling  sessions, Psychoeducation, Medication administration, Evaluate responses to treatment, Monitor vital signs and CBGs as ordered, Perform/monitor CIWA, COWS, AIMS and Fall Risk screenings as ordered, Perform wound care treatments as ordered.  Evaluation of Outcomes: Progressing   LCSW Treatment Plan for Primary Diagnosis: Major depressive disorder, recurrent, severe with psychotic features ([place]) Long Term Goal(s): Safe transition to appropriate next level of care at discharge, Engage patient in therapeutic group addressing interpersonal concerns.  Short Term Goals: Engage patient in aftercare planning with referrals and resources  Therapeutic Interventions: Assess for all discharge needs, 1 to 1 time with Social worker, Explore available resources and support systems, Assess for adequacy in community support network, Educate family and significant other(s) on suicide prevention, Complete Psychosocial Assessment, Interpersonal group therapy.  Evaluation of Outcomes: Met  Return home, and follow up outpt   Progress in Treatment: Attending groups: No Participating in groups: No Taking medication as prescribed: Yes Toleration medication: Yes, no side effects reported at this time Family/Significant other contact made: Yes Patient understands diagnosis: Yes AEB asking for help with psychosis. Discussing patient identified problems/goals with staff: Yes Medical problems stabilized or resolved: Yes Denies suicidal/homicidal ideation: Yes Issues/concerns per patient self-inventory: None Other: N/A  New problem(s) identified: None identified at this time.  New Short Term/Long Term Goal(s): "I want to get better so I can go home and see my people."   Discharge Plan or Barriers:   Reason for Continuation of Hospitalization: Altered mental status Disorganization Medication stabilization   Estimated Length of Stay: 7/22  Attendees: Patient:  10/04/2017  1:50 PM  Physician: Melba Coon,  MD 10/04/2017  1:50 PM  Nursing: Sena Hitch, RN 10/04/2017  1:50 PM  RN Care Manager: Lars Pinks, RN 10/04/2017  1:50 PM  Social Worker: Ripley Fraise 10/04/2017  1:50 PM  Recreational Therapist: Winfield Cunas 10/04/2017  1:50 PM  Other: Norberto Sorenson 10/04/2017  1:50 PM  Other:  10/04/2017  1:50 PM    Scribe for Treatment Team:  Roque Lias LCSW 10/04/2017 1:50 PM

## 2017-10-04 NOTE — Progress Notes (Signed)
Patient ID: Walter SonsDavid B Holder, male   DOB: 04/23/1955, 62 y.o.   MRN: [id number]   D: Patient pleasant on approach. The patient's son in briefly to see him but since Walter Holder was resting he said hello and left for the day. Walter Holder reports that he is feeling ok today. Answered questions appropriately tonight. Went over fall precautions with him. He reports that he has been walking fine and not needing the wheelchair. Urinal available if needed also. Denies having SI and contracts for safety on the unit. A: Staff will continue to monitor on q 15 minute checks, follow treatment plan, and give medications as ordered. R: Cooperative on the unit and took medications.

## 2017-10-04 NOTE — [facility] Group Notes (Signed)
Patient did not attend orientation and goals group.  

## 2017-10-04 NOTE — Plan of Care (Signed)
  Problem: Safety: Goal: Periods of time without injury will increase Outcome: Progressing   Problem: Medication: Goal: Compliance with prescribed medication regimen will improve Outcome: Progressing   Problem: Nutritional: Goal: Ability to achieve adequate nutritional intake will improve Outcome: Progressing  DAR NOTE: Patient presents with flat affect and depressed mood.  Oriented to person, place and time.  Described energy level as normal and concentration as good.  Denies suicidal thoughts, pain, auditory and visual hallucinations.  Rates depression at 0, hopelessness at 0, and anxiety at 0.  Maintained on routine safety checks.  Medications given as prescribed.  Support and encouragement offered as needed.  Attended group and participated.  States goal for today is "getting up and staying up."  Patient visible in milieu watching TV and not interacting with peers.  Offered no complaint.

## 2017-10-04 NOTE — [facility] Group Notes (Signed)
LCSW Group Therapy Note  10/04/2017 1:15pm  Type of Therapy/Topic:  Group Therapy:  Emotion Regulation  Participation Level:  Minimal   Description of Group:   The purpose of this group is to assist patients in learning to regulate negative emotions and experience positive emotions. Patients will be guided to discuss ways in which they have been vulnerable to their negative emotions. These vulnerabilities will be juxtaposed with experiences of positive emotions or situations, and patients will be challenged to use positive emotions to combat negative ones. Special emphasis will be placed on coping with negative emotions in conflict situations, and patients will process healthy conflict resolution skills.  Therapeutic Goals: 1. Patient will identify two positive emotions or experiences to reflect on in order to balance out negative emotions 2. Patient will label two or more emotions that they find the most difficult to experience 3. Patient will demonstrate positive conflict resolution skills through discussion and/or role plays  Summary of Patient Progress:   Stayed the entire time.  Was focused on drawing on a piece of paper the entire time, as per instruction. "I'm kind of stuck today."     Therapeutic Modalities:   Cognitive Behavioral Therapy Feelings Identification Dialectical Behavioral Therapy   [name], LCSW 10/04/2017 11:43 AM

## 2017-10-04 NOTE — Progress Notes (Signed)
[facility] MD Progress Note  10/04/2017 2:58 PM Walter Holder  MRN:  [id number]    History: Per assessment note 62 yo male brought to the ED by his family for AMS after a seizure, most likely caused by Xanax withdrawal (2 mg TID)which he must have obtained from the streets since the PMP indicates no refill history for Xanax (PMP does indicate recent prescriptions for Buprenorphine). After the seizure, he was having hallucinations but denied on assessment this morning. This afternoon he did tell this provider that he is hearing voices that I am coming toward him which is scaring him. Psychosis in the past but none lately. Noted to have a UTI. Does admit to using many Xanax and feels his seizure was related to withdrawal symptoms. Depressed and anxious, slow to respond in his room, thought blocking present, not sure about how much cognitive processing is working at this time; I.e Not sure he comprehends questions. The pt is not a good historian.  Lin Givens  is seen, chart reviewed. The chart findings discussed with the treatment team. Walter Holder seen resting today, and is out in day room.  He is slow to respond.  He is oriented to person and place and years with clues. He denies symptoms of depression or feeling depressed, reports that he "feels confused".  He denies AVH, homicidal or suicidal thought during this assessment and does not appear to be responding to internal stimuli. Support encouragement reassurance was provided.  Spoke with wife today who reports that via phone conversation Walter Holder seemed much clearer and more like himself.  Wife intends to search house and remove any THC edibles prior to [place] returning home.   Reviewed head CT's from 09/24/2017 to 09/27/2017 with noted change.  Neurology consult requested, and transport to ED arranged for further medical work-up in setting of delirium.    10/03/2017 Spoke with wife who states that he has been depressed due to father and sister died in past  year, his band broke up. Reviewed THC on UDS per ED labs.   Reviewed about  Principal Problem: Major depressive disorder, recurrent, severe with psychotic features ([place]) Diagnosis:   Patient Active Problem List   Diagnosis Date Noted  . Delirium due to another medical condition [F05] 09/28/2017  . Benign prostatic hyperplasia [N40.0] 08/22/2014  . Abscess and cellulitis [L03.90, L02.91] 08/14/2014  . Cellulitis of right lower extremity [L03.115] 08/14/2014  . Recurrent major depression-severe ([place]) [F33.2] 03/29/2014  . Major depressive disorder, recurrent, severe with psychotic features ([place]) [F33.3] 03/28/2014  . Opiate dependence ([place]) [F11.20] 03/28/2014  . Benzodiazepine abuse, episodic ([place]) [F13.10] 03/28/2014  . Colitis due to Clostridium difficile [A04.72] 02/29/2012    Class: Acute  . Hypokalemia [E87.6] 02/29/2012    Class: Acute  . Low back pain [M54.5] 02/29/2012    Class: Chronic  . Acute infective gastroenteritis [A09] 02/28/2012  . Dehydration [E86.0] 02/28/2012  . Tachycardia [R00.0] 02/28/2012  . Heme positive stool [R19.5] 02/28/2012   Total Time spent with patient: 45 minutes   Past Medical History:  Past Medical History:  Diagnosis Date  . Anginal pain ([place]) ~ 2007  . Arthritis    "might be getting some" (02/28/2012)  . C. difficile diarrhea ~ 2008; 02/28/2012  . Chronic lower back pain   . Complication of anesthesia    "my ears rang before I went out" (02/28/2012)  . GERD (gastroesophageal reflux disease)   . Stress at home    "from my daughter mostly" (02/28/2012)  Past Surgical History:  Procedure Laterality Date  . CARDIAC CATHETERIZATION  ~ 2007  . MULTIPLE TOOTH EXTRACTIONS  1990's   "3" (02/28/2012)   Family History:  Family History  Problem Relation Age of Onset  . Alzheimer's disease Mother   . Diabetes Mellitus II Neg Hx    Family Psychiatric  History: see H&P Social History:  Social History   Substance and Sexual Activity   Alcohol Use Not Currently   Comment: 02/28/2012 "5-6 beers on the weekends" 08/22/14 none.     Social History   Substance and Sexual Activity  Drug Use Yes  . Types: Marijuana, LSD, Benzodiazepines, Cocaine   Comment: 02/28/2012 "back in the 1970's"     Social History   Socioeconomic History  . Marital status: Married    Spouse name: Not on file  . Number of children: Not on file  . Years of education: Not on file  . Highest education level: Not on file  Occupational History  . Not on file  Social Needs  . Financial resource strain: Not on file  . Food insecurity:    Worry: Not on file    Inability: Not on file  . Transportation needs:    Medical: Not on file    Non-medical: Not on file  Tobacco Use  . Smoking status: Never Smoker  . Smokeless tobacco: Never Used  Substance and Sexual Activity  . Alcohol use: Not Currently    Comment: 02/28/2012 "5-6 beers on the weekends" 08/22/14 none.  . Drug use: Yes    Types: Marijuana, LSD, Benzodiazepines, Cocaine    Comment: 02/28/2012 "back in the 1970's"   . Sexual activity: Yes  Lifestyle  . Physical activity:    Days per week: Not on file    Minutes per session: Not on file  . Stress: Not on file  Relationships  . Social connections:    Talks on phone: Not on file    Gets together: Not on file    Attends religious service: Not on file    Active member of club or organization: Not on file    Attends meetings of clubs or organizations: Not on file    Relationship status: Not on file  Other Topics Concern  . Not on file  Social History Narrative  . Not on file   Additional Social History:     See H&P                    Sleep: Poor  Appetite:  Fair  Current Medications: Current Facility-Administered Medications  Medication Dose Route Frequency Provider Last Rate Last Dose  . acetaminophen (TYLENOL) tablet 650 mg  650 mg Oral Q6H PRN Patrecia Pour, NP   650 mg at 09/29/17 2138  . alum & mag  hydroxide-simeth (MAALOX/MYLANTA) 200-200-20 MG/5ML suspension 30 mL  30 mL Oral Q4H PRN Patrecia Pour, NP      . cephALEXin (KEFLEX) capsule 500 mg  500 mg Oral Q8H Patrecia Pour, NP   500 mg at 10/04/17 1333  . DULoxetine (CYMBALTA) DR capsule 30 mg  30 mg Oral QHS Patrecia Pour, NP   30 mg at 10/03/17 2106  . haloperidol (HALDOL) tablet 10 mg  10 mg Oral QHS Lavella Hammock, MD   10 mg at 10/03/17 2105  . lisinopril (PRINIVIL,ZESTRIL) tablet 10 mg  10 mg Oral Daily Patrecia Pour, NP   10 mg at 10/04/17 0818  . magnesium hydroxide (MILK  OF MAGNESIA) suspension 30 mL  30 mL Oral Daily PRN Patrecia Pour, NP      . multivitamin with minerals tablet 1 tablet  1 tablet Oral Daily Patrecia Pour, NP   1 tablet at 10/04/17 0818  . tamsulosin (FLOMAX) capsule 0.4 mg  0.4 mg Oral QPC supper Derrill Center, NP   0.4 mg at 10/03/17 1816  . thiamine (VITAMIN B-1) tablet 100 mg  100 mg Oral Daily Patrecia Pour, NP   100 mg at 10/04/17 0818  . traZODone (DESYREL) tablet 50 mg  50 mg Oral Once Rozetta Nunnery, NP        Lab Results:  Results for orders placed or performed during the hospital encounter of 09/27/17 (from the past 48 hour(s))  Urinalysis, Routine w reflex microscopic     Status: Abnormal   Collection Time: 10/02/17  5:00 PM  Result Value Ref Range   Color, Urine YELLOW YELLOW   APPearance HAZY (A) CLEAR   Specific Gravity, Urine 1.016 1.005 - 1.030   pH 5.0 5.0 - 8.0   Glucose, UA NEGATIVE NEGATIVE mg/dL   Hgb urine dipstick NEGATIVE NEGATIVE   Bilirubin Urine NEGATIVE NEGATIVE   Ketones, ur 5 (A) NEGATIVE mg/dL   Protein, ur NEGATIVE NEGATIVE mg/dL   Nitrite NEGATIVE NEGATIVE   Leukocytes, UA NEGATIVE NEGATIVE    Comment: Performed at [facility] [address], [place], [place] [address]  Rapid urine drug screen (hospital performed)     Status: Abnormal   Collection Time: 10/02/17  5:00 PM  Result Value Ref Range   Opiates NONE DETECTED NONE  DETECTED   Cocaine NONE DETECTED NONE DETECTED   Benzodiazepines NONE DETECTED NONE DETECTED   Amphetamines NONE DETECTED NONE DETECTED   Tetrahydrocannabinol POSITIVE (A) NONE DETECTED   Barbiturates (A) NONE DETECTED    Result not available. Reagent lot number recalled by manufacturer.    Comment: Performed at [facility], [place] [address] [place], [place] [address]  Urine culture     Status: None   Collection Time: 10/02/17  5:00 PM  Result Value Ref Range   Specimen Description      URINE, CLEAN CATCH Performed at [facility], [place] [address] [place], [place] [address]    Special Requests      NONE Performed at [facility], [place] [address], [place], [place] [address]    Culture      NO GROWTH Performed at [facility], [place] [address], [place],  [address]    Report Status 10/04/2017 FINAL   CBC with Differential     Status: None   Collection Time: 10/02/17  5:30 PM  Result Value Ref Range   WBC 9.3 4.0 - 10.5 K/uL   RBC 5.11 4.22 - 5.81 MIL/uL   Hemoglobin 15.1 13.0 - 17.0 g/dL   HCT 44.4 39.0 - 52.0 %   MCV 86.9 78.0 - 100.0 fL   MCH 29.5 26.0 - 34.0 pg   MCHC 34.0 30.0 - 36.0 g/dL   RDW 15.4 11.5 - 15.5 %   Platelets 264 150 - 400 K/uL   Neutrophils Relative % 74 %   Neutro Abs 6.8 1.7 - 7.7 K/uL   Lymphocytes Relative 21 %   Lymphs Abs 1.9 0.7 - 4.0 K/uL   Monocytes Relative 5 %   Monocytes Absolute 0.5 0.1 - 1.0 K/uL   Eosinophils Relative 0 %   Eosinophils  Absolute 0.0 0.0 - 0.7 K/uL   Basophils Relative 0 %   Basophils Absolute 0.0 0.0 - 0.1 K/uL    Comment: Performed at [facility], [place] [address], [place], [place] [address]  Comprehensive metabolic panel     Status: Abnormal   Collection Time: 10/02/17  5:30 PM  Result Value Ref Range   Sodium 139 135 - 145 mmol/L   Potassium 3.5 3.5 - 5.1 mmol/L   Chloride 104 98 - 111 mmol/L    Comment: Please note change in  reference range.   CO2 24 22 - 32 mmol/L   Glucose, Bld 150 (H) 70 - 99 mg/dL    Comment: Please note change in reference range.   BUN 11 6 - 20 mg/dL    Comment: Please note change in reference range.   Creatinine, Ser 0.74 0.61 - 1.24 mg/dL   Calcium 9.1 8.9 - 10.3 mg/dL   Total Protein 7.6 6.5 - 8.1 g/dL   Albumin 3.8 3.5 - 5.0 g/dL   AST 18 15 - 41 U/L   ALT 26 0 - 44 U/L    Comment: Please note change in reference range.   Alkaline Phosphatase 66 38 - 126 U/L   Total Bilirubin 0.8 0.3 - 1.2 mg/dL   GFR calc non Af Amer >60 >60 mL/min   GFR calc Af Amer >60 >60 mL/min    Comment: (NOTE) The eGFR has been calculated using the CKD EPI equation. This calculation has not been validated in all clinical situations. eGFR's persistently <60 mL/min signify possible Chronic Kidney Disease.    Anion gap 11 5 - 15    Comment: Performed at [facility], [place] [address], [place], [place] [address]  Ammonia     Status: None   Collection Time: 10/02/17  5:32 PM  Result Value Ref Range   Ammonia 20 9 - 35 umol/L    Comment: Performed at [facility], [place] [address], [place], [place] [address]  Rapid urine drug screen (hospital performed)     Status: Abnormal   Collection Time: 10/03/17 12:11 PM  Result Value Ref Range   Opiates NONE DETECTED NONE DETECTED   Cocaine NONE DETECTED NONE DETECTED   Benzodiazepines NONE DETECTED NONE DETECTED   Amphetamines NONE DETECTED NONE DETECTED   Tetrahydrocannabinol POSITIVE (A) NONE DETECTED   Barbiturates (A) NONE DETECTED    Result not available. Reagent lot number recalled by manufacturer.    Comment: Performed at [facility], [place] [address] [place],  [address]    Blood Alcohol level:  Lab Results  Component Value Date   ETH <10 09/27/2017   ETH <5 62/83/1517    Metabolic Disorder Labs: Lab Results  Component Value Date   HGBA1C 5.4 04/01/2014   MPG 108 04/01/2014   No  results found for: PROLACTIN Lab Results  Component Value Date   CHOL 187 04/01/2014   TRIG 225 (H) 04/01/2014   HDL 42 04/01/2014   CHOLHDL 4.5 04/01/2014   VLDL 45 (H) 04/01/2014   LDLCALC 100 (H) 04/01/2014   LDLCALC  03/12/2008    97        Total Cholesterol/HDL:CHD Risk Coronary Heart Disease Risk Table                     Men   Women  1/2 Average Risk   3.4   3.3    Physical Findings: AIMS: Facial and Oral Movements  Muscles of Facial Expression: None, normal Lips and Perioral Area: None, normal Jaw: None, normal Tongue: None, normal,Extremity Movements Upper (arms, wrists, hands, fingers): None, normal Lower (legs, knees, ankles, toes): None, normal, Trunk Movements Neck, shoulders, hips: None, normal, Overall Severity Severity of abnormal movements (highest score from questions above): None, normal Incapacitation due to abnormal movements: None, normal Patient's awareness of abnormal movements (rate only patient's report): No Awareness, Dental Status Current problems with teeth and/or dentures?: No Does patient usually wear dentures?: No  CIWA:  CIWA-Ar Total: 3 COWS:  COWS Total Score: 2  Musculoskeletal: Strength & Muscle Tone: within normal limits Gait & Station: normal Patient leans: N/A  Psychiatric Specialty Exam: Physical Exam  Nursing note and vitals reviewed. Constitutional: He is oriented to person, place, and time. No distress.  Lethargic   Neurological: He is alert and oriented to person, place, and time.      Psychiatric: He has a normal mood and affect.  Psychomotor retardation.     Review of Systems  Psychiatric/Behavioral: Positive for substance abuse. Negative for depression (denies), hallucinations and suicidal ideas. The patient has insomnia. The patient is not nervous/anxious.   All other systems reviewed and are negative.   Blood pressure 127/82, pulse 97, temperature 98.6 F (37 C), temperature source Oral, resp. rate 20, height  5' 7"  (1.702 m), weight 95.3 kg (210 lb), SpO2 96 %.Body mass index is 32.89 kg/m.  General Appearance: Disheveled cooperative   Eye Contact:  Minimal  Speech:  Slow  Volume:  Decreased  Mood:  "I feel ok"  Affect:  Blunt  Thought Process:  Descriptions of Associations: Intact  Orientation:  Full (Time, Place, and Person)  Thought Content:  Logical and Hallucinations: None - does not appear to be responding to internal stimuli  Suicidal Thoughts:  No   Homicidal Thoughts:  No   Memory:  Recent;   Fair Remote;   Fair    Judgement:  Fair   Insight:  Fair   Psychomotor Activity:  Psychomotor Retardation  Concentration:  Concentration: Fair and Attention Span: Fair  Recall:  Good   Fund of Knowledge:  Good   Language:  Good  Akathisia:  No  AIMS (if indicated):     Assets:  Social Support  ADL's:  Intact  Cognition:  WNL  Sleep:  Number of Hours: 5.5    Treatment Plan Summary: Daily contact with patient to assess and evaluate symptoms and progress in treatment and Medication management    -Will continue with current treatment plan on  10/04/17  plan as below except where it is noted.  -Psychosis            -Change to Seroquel 100 mg HS  -Depression  -Continue Cymbalta 30 mg HS  -Delirium  -Continue Keflex 500 mg Q 8 hours for UTI  -Anxiety / CIWA                     -continue Atarax 25 mg po q6h prn anxiety  -continue multivitamin and B-1 100 mg QD   - Other medical   - Prinvil 10 mg QD for HTN  - restarted Flomax 0.57m for BPH  - discontinued PRN medications that can contribute to delirium.  Patient had not taken.  Will continue to monitor vitals ,medication compliance and treatment side effects while patient is here.   Reviewed labs  -Encourage participation in groups and therapeutic milieu -Disposition planning will be ongoing    [facility]Na[facility] Hospital[facility]  Leverne Humbles, MD 10/04/2017, 2:58 PM

## 2017-10-05 NOTE — Progress Notes (Signed)
[facility]  10/05/2017 3:46 PM Erin SonsDavid B Weger  MRN:  [id number]   History: Per assessment note 62 yo male brought to the ED by his family for AMS after a seizure, most likely caused by Xanax withdrawal (2 mg TID)which he must have obtained from the streets since the PMP indicates no refill history for Xanax (PMP does indicate recent prescriptions for Buprenorphine). After the seizure, he was having hallucinations but denied on assessment this morning. This afternoon he did tell this provider that he is hearing voices that I am coming toward him which is scaring him. Psychosis in the past but none lately. Noted to have a UTI. Does admit to using many Xanax and feels his seizure was related to withdrawal symptoms. Depressed and anxious, slow to respond in his room, thought blocking present, not sure about how much cognitive processing is working at this time; I.e Not sure he comprehends questions. The pt is not a good historian.  Erin Sonsavid B Stiggers  is seen, chart reviewed. The chart findings discussed with the treatment team. Onalee Huaavid seen resting today, and is in and out of day room.  He is slow to respond upon awakening.  He is oriented to person and place and year. He denies symptoms of depression or feeling depressed, reports that he "feels confused, and doesn't know how THC got in his urine".  He denies AVH, homicidal or suicidal thought during this assessment and does not appear to be responding to internal stimuli. Support encouragement reassurance was provided.    Spoke with wife 10/04/2017 who reports that via phone conversation Onalee HuaDavid seemed much clearer and more like himself.  Wife intends to search house and remove any THC edibles prior to [place] returning home.   Reviewed head CT's from 09/24/2017 to 09/27/2017 with noted change.  Neurology consult requested, and transport to ED arranged for further medical work-up in setting of delirium.    10/03/2017 Spoke with wife who states that  he has been depressed due to father and sister died in past year, his band broke up. Reviewed THC on UDS per ED labs.   Reviewed about  Principal Problem: Major depressive disorder, recurrent, severe with psychotic features (HCC) Diagnosis:   Patient Active Problem List   Diagnosis Date Noted  . Delirium due to another medical condition [F05] 09/28/2017  . Benign prostatic hyperplasia [N40.0] 08/22/2014  . Abscess and cellulitis [L03.90, L02.91] 08/14/2014  . Cellulitis of right lower extremity [L03.115] 08/14/2014  . Recurrent major depression-severe (HCC) [F33.2] 03/29/2014  . Major depressive disorder, recurrent, severe with psychotic features (HCC) [F33.3] 03/28/2014  . Opiate dependence (HCC) [F11.20] 03/28/2014  . 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 Colitis due to Clostridium difficile [A04.72] 02/29/2012    Class: Acute  . Hypokalemia [E87.6] 02/29/2012    Class: Acute  . Low back pain [M54.5] 02/29/2012    Class: Chronic  . Acute infective gastroenteritis [A09] 02/28/2012  . Dehydration [E86.0] 02/28/2012  . Tachycardia [R00.0] 02/28/2012  . Heme positive stool [R19.5] 02/28/2012   Total Time spent with patient: 35 min   Past Medical History:  Past Medical History:  Diagnosis Date  . Anginal pain (HCC) ~ 2007  . Arthritis    "might be getting some" (02/28/2012)  . C. difficile diarrhea ~ 2008; 02/28/2012  . Chronic lower back pain   . Complication of anesthesia    "my ears rang before I went out" (02/28/2012)  . GERD (gastroesophageal reflux disease)   .  Stress at home    "from my daughter mostly" (02/28/2012)    Past Surgical History:  Procedure Laterality Date  . CARDIAC CATHETERIZATION  ~ 2007  . MULTIPLE TOOTH EXTRACTIONS  1990's   "3" (02/28/2012)   Family History:  Family History  Problem Relation Age of Onset  . Alzheimer's disease Mother   . Diabetes Mellitus II Neg Hx    Family Psychiatric  History: see H&P Social  History:  Social History   Substance and Sexual Activity  Alcohol Use Not Currently   Comment: 02/28/2012 "5-6 beers on the weekends" 08/22/14 none.     Social History   Substance and Sexual Activity  Drug Use Yes  . Types: Marijuana, LSD, Benzodiazepines, Cocaine   Comment: 02/28/2012 "back in the 1970's"     Social History   Socioeconomic History  . Marital status: Married    Spouse name: Not on file  . Number of children: Not on file  . Years of education: Not on file  . Highest education level: Not on file  Occupational History  . Not on file  Social Needs  . Financial resource strain: Not on file  . Food insecurity:    Worry: Not on file    Inability: Not on file  . Transportation needs:    Medical: Not on file    Non-medical: Not on file  Tobacco Use  . Smoking status: Never Smoker  . Smokeless tobacco: Never Used  Substance and Sexual Activity  . Alcohol use: Not Currently    Comment: 02/28/2012 "5-6 beers on the weekends" 08/22/14 none.  . Drug use: Yes    Types: Marijuana, LSD, Benzodiazepines, Cocaine    Comment: 02/28/2012 "back in the 1970's"   . Sexual activity: Yes  Lifestyle  . Physical activity:    Days per week: Not on file    Minutes per session: Not on file  . Stress: Not on file  Relationships  . Social connections:    Talks on phone: Not on file    Gets together: Not on file    Attends religious service: Not on file    Active member of club or organization: Not on file    Attends meetings of clubs or organizations: Not on file    Relationship status: Not on file  Other Topics Concern  . Not on file  Social History Narrative  . Not on file   Additional Social History:     See H&P                    Sleep: Poor  Appetite:  Fair  Current Medications: Current Facility-Administered Medications  Medication Dose Route Frequency Provider Last Rate Last Dose  . acetaminophen (TYLENOL) tablet 650 mg  650 mg Oral Q6H PRN Charm Rings, NP   650 mg at 09/29/17 2138  . alum & mag hydroxide-simeth (MAALOX/MYLANTA) 200-200-20 MG/5ML suspension 30 mL  30 mL Oral Q4H PRN Charm Rings, NP      . cephALEXin (KEFLEX) capsule 500 mg  500 mg Oral Q8H Charm Rings, NP   500 mg at 10/05/17 1502  . DULoxetine (CYMBALTA) DR capsule 30 mg  30 mg Oral QHS Charm Rings, NP   30 mg at 10/04/17 2116  . lisinopril (PRINIVIL,ZESTRIL) tablet 10 mg  10 mg Oral Daily Charm Rings, NP   10 mg at 10/05/17 0759  . magnesium hydroxide (MILK OF MAGNESIA) suspension 30 mL  30 mL Oral  Daily PRN Charm Rings, NP      . multivitamin with minerals tablet 1 tablet  1 tablet Oral Daily Charm Rings, NP   1 tablet at 10/05/17 0759  . QUEtiapine (SEROQUEL) tablet 100 mg  100 mg Oral QHS Mariel Craft, MD   100 mg at 10/04/17 2116  . tamsulosin (FLOMAX) capsule 0.4 mg  0.4 mg Oral QPC supper Oneta Rack, NP   0.4 mg at 10/04/17 1816  . thiamine (VITAMIN B-1) tablet 100 mg  100 mg Oral Daily Charm Rings, NP   100 mg at 10/05/17 1610    Lab Results:  No results found for this or any previous visit (from the past 48 hour(s)).  Blood Alcohol level:  Lab Results  Component Value Date   ETH <10 09/27/2017   ETH <5 03/27/2014    Metabolic Disorder Labs: Lab Results  Component Value Date   HGBA1C 5.4 04/01/2014   MPG 108 04/01/2014   No results found for: PROLACTIN Lab Results  Component Value Date   CHOL 187 04/01/2014   TRIG 225 (H) 04/01/2014   HDL 42 04/01/2014   CHOLHDL 4.5 04/01/2014   VLDL 45 (H) 04/01/2014   LDLCALC 100 (H) 04/01/2014   LDLCALC  03/12/2008    97        Total Cholesterol/HDL:CHD Risk Coronary Heart Disease Risk Table                     Men   Women  1/2 Average Risk   3.4   3.3    Physical Findings: AIMS: Facial and Oral Movements Muscles of Facial Expression: None, normal Lips and Perioral Area: None, normal Jaw: None, normal Tongue: None, normal,Extremity Movements Upper (arms,  wrists, hands, fingers): None, normal Lower (legs, knees, ankles, toes): None, normal, Trunk Movements Neck, shoulders, hips: None, normal, Overall Severity Severity of abnormal movements (highest score from questions above): None, normal Incapacitation due to abnormal movements: None, normal Patient's awareness of abnormal movements (rate only patient's report): No Awareness, Dental Status Current problems with teeth and/or dentures?: No Does patient usually wear dentures?: No  CIWA:  CIWA-Ar Total: 0 COWS:  COWS Total Score: 2  Musculoskeletal: Strength & Muscle Tone: within normal limits Gait & Station: normal Patient leans: N/A  Psychiatric Specialty Exam: Physical Exam  Nursing note and vitals reviewed. Constitutional: He is oriented to person, place, and time. He appears well-developed and well-nourished. No distress.  Lethargic   Musculoskeletal: Normal range of motion.  Neurological: He is alert and oriented to person, place, and time.      Psychiatric: He has a normal mood and affect.       Review of Systems  Psychiatric/Behavioral: Positive for substance abuse. Negative for depression (denies), hallucinations and suicidal ideas. The patient has insomnia. The patient is not nervous/anxious.   All other systems reviewed and are negative.   Blood pressure 118/83, pulse (!) 112, temperature 98.9 F (37.2 C), temperature source Oral, resp. rate 20, height 5\' 7"  (1.702 m), weight 95.3 kg (210 lb), SpO2 96 %.Body mass index is 32.89 kg/m.  General Appearance: Disheveled cooperative   Eye Contact:  Fair  Speech:  Slow  Volume:  Decreased  Mood:  Euthymic  Affect:  Congruent  Thought Process:  Linear and Descriptions of Associations: Intact  Orientation:  Full (Time, Place, and Person)  Thought Content:  Logical and Hallucinations: None   Suicidal Thoughts:  No   Homicidal Thoughts:  No   Memory:  Recent;   Fair Remote;   Fair    Judgement:  Fair   Insight:  Fair    Psychomotor Activity:  Decreased  Concentration:  Concentration: Fair and Attention Span: Fair  Recall:  Good   Fund of Knowledge:  Good   Language:  Good  Akathisia:  No  AIMS (if indicated):     Assets:  Social Support  ADL's:  Intact  Cognition:  WNL  Sleep:  Number of Hours: 6.75    Treatment Plan Summary: Daily contact with patient to assess and evaluate symptoms and progress in treatment and Medication management   -Will continue with current treatment plan on  10/05/17  plan as below except where it is noted.  -Psychosis            -Continue Seroquel 100 mg HS  -Depression  -Continue Cymbalta 30 mg HS  -Delirium  -Continue Keflex 500 mg Q 8 hours for UTI  -Anxiety / CIWA                     -continue Atarax 25 mg po q6h prn anxiety  -continue multivitamin and B-1 100 mg QD   - Other medical   - Prinvil 10 mg QD for HTN  - restarted Flomax 0.4mg  for BPH  - discontinued PRN medications that can contribute to delirium.  Patient had not taken.  Will continue to monitor vitals ,medication compliance and treatment side effects while patient is here.   Reviewed labs  -Encourage participation in groups and therapeutic milieu -Disposition planning will be ongoing    Mariel Craft, MD 10/05/2017, 3:46 PM

## 2017-10-05 NOTE — B[facility] Group Notes (Signed)
Adult Psychoeducational Group Note  Date:  10/05/2017 Time:  9:19 PM  Group Topic/Focus:  Recovery Goals:   The focus of this group is to identify appropriate goals for recovery and establish a plan to achieve them. Wrap-Up Group:   The focus of this group is to help patients review their daily goal of treatment and discuss progress on daily workbooks.  Participation Level:  Active  Participation Quality:  Appropriate and Attentive  Affect:  Appropriate  Cognitive:  Alert and Appropriate  Insight: Appropriate and Good  Engagement in Group:  Engaged  Modes of Intervention:  Discussion and Education  Additional Comments:  Pt attended and participated in wrap up group. Pt rated their day a 8/10 due to them waking up today and having no thoughts. Pt completed their goal to work on a discharge plan.   Chrisandra NettersOctavia A Tyson Masin 10/05/2017, 9:19 PM

## 2017-10-05 NOTE — Progress Notes (Signed)
Nursing Progress Note: 7p-7a D: Pt currently presents with a pleasant/cooperative/minimal/thought blocking affect and behavior. Pt states "I am ready to leave." Interacting appropriately with the milieu. Pt reports good sleep during the previous night with current medication regimen. Pt did attend wrap-up group.  A: Pt provided with medications per providers orders. Pt's labs and vitals were monitored throughout the night. Pt supported emotionally and encouraged to express concerns and questions. Pt educated on medications.  R: Pt's safety ensured with 15 minute and environmental checks. Pt currently denies SI, HI, and AVH. Pt verbally contracts to seek staff if SI,HI, or AVH occurs and to consult with staff before acting on any harmful thoughts. Will continue to monitor.

## 2017-10-05 NOTE — Progress Notes (Signed)
Patient has been isolative to his room this shift.  Patient denies SI, HI and AVH this shift. Patient has been able to contract for safety this shift and has required minimal redirection.    Assess patient for safety, offer medications as prescribed, engage patient in 1:1 staff talks.   Continue to monitor as planned.  Patient able to contract for safety.

## 2017-10-05 NOTE — Progress Notes (Signed)
Recreation Therapy Notes  Date: 7.18.19 Time: 1000 Location: 500 Hall Dayroom   Group Topic: Communication, Team Building, Problem Solving  Goal Area(s) Addresses:   Patient will identify skills used to make activity successful.  Patient will identify how skills used during activity can be used to reach post d/c goals.   Intervention: STEM Activity  Activity: Stage managerLanding Pad. Individually, patients were given 12 plastic drinking straws and a length of masking tape. Using the materials provided patients were asked to build a landing pad to catch a golf ball dropped from approximately 6 feet in the air.   Education: Pharmacist, communityocial Skills, Discharge Planning   Education Outcome: Acknowledges education/In group clarification offered/Needs additional education.   Clinical Observations/Feedback:  Pt did not attend group.    Caroll RancherMarjette Estaban [place], LRT/CTRS         Lillia AbedLindsay, Alliah Boulanger A 10/05/2017 11:09 AM

## 2017-10-05 NOTE — [facility] Group Notes (Signed)
Walter Holder  10/05/2017 , 1:19 PM    Type of Holder:  [employer] Presentation  Participation Level:  Active  Participation Quality:  Attentive  Affect:  Blunted  Cognitive:  Oriented  Insight:  Limited  Engagement in Holder:  Engaged  Modes of Intervention:  Discussion, Education and Socialization  Summary of Progress/Problems:  [name]  from [employer] came to present her recovery story, encourage group  members to share something about their story, and present information about the [employer].  Stayed the entire time, engaged throughout. Chose not to share when given the opportunity at the end.  [name], [name] 10/05/2017 , 1:19 PM

## 2017-10-06 MED ORDER — HYDROXYZINE HCL 25 MG PO TABS: ORAL_TABLET | ORAL | 0 refills | Status: AC

## 2017-10-06 MED ORDER — LISINOPRIL 10 MG PO TABS: 10.0000 mg | ORAL_TABLET | Freq: Every day | ORAL | 0 refills | Status: AC

## 2017-10-06 MED ORDER — TAMSULOSIN HCL 0.4 MG PO CAPS: 0.4000 mg | ORAL_CAPSULE | Freq: Every day | ORAL | 0 refills | Status: AC

## 2017-10-06 MED ORDER — QUETIAPINE FUMARATE 100 MG PO TABS: 100.0000 mg | ORAL_TABLET | Freq: Every day | ORAL | 0 refills | Status: AC

## 2017-10-06 MED ORDER — DULOXETINE HCL 30 MG PO CPEP: 30.0000 mg | ORAL_CAPSULE | Freq: Every day | ORAL | 0 refills | Status: AC

## 2017-10-06 NOTE — Plan of Care (Signed)
Pt attended one recreational therapy group session.   Kateleen Encarnacion, LRT/CTRS 

## 2017-10-06 NOTE — [facility] Suicide Risk Assessment (Signed)
[facility] Discharge Suicide Risk Assessment   Principal Problem: Major depressive disorder, recurrent, severe with psychotic features [facility]) Discharge Diagnoses:  Patient Active Problem List   Diagnosis Date Noted  . Delirium due to another medical condition [F05] 09/28/2017  . Benign prostatic hyperplasia [N40.0] 08/22/2014  . Abscess and cellulitis [L03.90, L02.91] 08/14/2014  . Cellulitis of right lower extremity [L03.115] 08/14/2014  . Recurrent major depression-severe (HCC) [F33.2] 03/29/2014  . Major depressive disorder, recurrent, severe with psychotic features (HCC) [F33.3] 03/28/2014  . Opiate dependence (HCC) [F11.20] 03/28/2014  . 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 Colitis due to Clostridium difficile [A04.72] 02/29/2012    Class: Acute  . Hypokalemia [E87.6] 02/29/2012    Class: Acute  . Low back pain [M54.5] 02/29/2012    Class: Chronic  . Acute infective gastroenteritis [A09] 02/28/2012  . Dehydration [E86.0] 02/28/2012  . Tachycardia [R00.0] 02/28/2012  . Heme positive stool [R19.5] 02/28/2012   Total Time spent with patient: 45 minutes   History: Per assessment note23 yo male brought to the ED by his family for AMS after a seizure, most likely caused by Xanax withdrawal (2 mg TID)which he must have obtained from the streets since the PMP indicates no refill history for Xanax (PMP does indicate recent prescriptions for Buprenorphine). After the seizure, he was having hallucinations but denied on assessment this morning. This afternoon he did tell this provider that he is hearing voices that I am coming toward him which is scaring him. Psychosis in the past but none lately. Noted to have a UTI. Does admit to using many Xanax and feels his seizure was related to withdrawal symptoms. Depressed and anxious, slow to respond in his room, thought blocking present, not sure about how much cognitive processing is working at this time; I.e Not sure he  comprehends questions. The pt is not a good historian.  Walter Holder  is seen, chart reviewed. The chart findings discussed with the treatment team. Onalee Hua seen today.  He reports he feels ready to go home.  Reports a good appetite and states He is resting well. Walter Holder denies any symptoms of depression, SIHI, AVH, delusional thoughts or paranoia, and does not appear to be responding to any internal stimuli. He arouses easily and is oriented x4.  Patient is visible on the milieu.  Patient seen attending groups with active and engaged participation. Walter Holder has agreed to continue the current plan of care already in progress. He denies any other issues or concerns. He was able to engage in safety planning including plan to return [facility] or contact emergency services if he feels unable to maintain his own safety or the safety of others. He states he is "going to stay away from [facility] edibles"  Pt had no further questions, comments, or concerns. Support encouragement reassurance was provided.   Spoke with wife 10/04/2017 who reports that via phone conversation Artur seemed much clearer and more like himself.  Wife intends to search house and remove any THC edibles prior to [place] returning home.   Reviewed head CT's from 09/24/2017 to 09/27/2017 with noted change.  Neurology consult requested, and transport to ED arranged for further medical work-up in setting of delirium.    10/03/2017 Spoke with wife who states that he has been depressed due to father and sister died in past year, his band broke up. Reviewed THC on UDS per ED labs.    Musculoskeletal: Strength & Muscle Tone: within normal limits Gait &  Station: normal Patient leans: N/A  Psychiatric Specialty Exam: Review of Systems  Constitutional: Negative.   Respiratory: Negative.   Cardiovascular: Negative.   Gastrointestinal: Negative.   Musculoskeletal: Negative.   Neurological: Positive for weakness (slight).   Psychiatric/Behavioral: Positive for substance abuse (edible THC products). Negative for depression, hallucinations and suicidal ideas. The patient has insomnia. The patient is not nervous/anxious.     Blood pressure 117/81, pulse (!) 105, temperature 98.3 F (36.8 C), temperature source Oral, resp. rate 16, height 5\' 7"  (1.702 m), weight 95.3 kg (210 lb), SpO2 96 %.Body mass index is 32.89 kg/m.  General Appearance: Casual and Fairly Groomed  Patent attorneyye Contact::  Fair  Speech:  Clear and Coherent and Normal Rate409  Volume:  Normal  Mood:  Euthymic  Affect:  Congruent  Thought Process:  Coherent, Goal Directed, Linear and Descriptions of Associations: Intact  Orientation:  Full (Time, Place, and Person)  Thought Content:  Logical and Hallucinations: None  Suicidal Thoughts:  No  Homicidal Thoughts:  No  Memory:  Immediate;   Good Recent;   Good Remote;   Good  Judgement:  Fair  Insight:  Fair  Psychomotor Activity:  Normal  Concentration:  Good  Recall:  Good  Fund of Knowledge:Good  Language: Good  Akathisia:  No  AIMS (if indicated):     Assets:  Desire for Improvement Housing Social Support  Sleep:  Number of Hours: 6.75  Cognition: WNL  ADL's:  Intact   Mental Status Per Nursing Assessment::   On Admission:  NA  Demographic Factors:  Male and Caucasian  Loss Factors: recent losses of family members and social supports  Historical Factors: Family history of mental illness or substance abuse  Risk Reduction Factors:   Sense of responsibility to family and Living with another person, especially a relative  Continued Clinical Symptoms:  Depression:   Comorbid alcohol abuse/dependence Insomnia Alcohol/Substance Abuse/Dependencies  Cognitive Features That Contribute To Risk:  None    Suicide Risk:  Minimal: No identifiable suicidal ideation.  Patients presenting with no risk factors but with morbid ruminations; may be classified as minimal risk based on the  severity of the depressive symptoms  Follow-up Information    Marletta LorBarr, Julie, NP Follow up on 10/16/2017.   Specialty:  Nurse Practitioner Why:  Monday at 9:30 with Dr Teola BradleyBarr for your hospital follow up appointment. Contact information: [facility] [address] [place] Condon[place] [address] [phone]           Plan Of Care/Follow-up recommendations:  Activity:  as tolerated Diet:  as tolerated   -Will continue with medications as per hospitalization  -Psychosis -Continue Seroquel 100 mg HS  -Depression             -Continue Cymbalta 30 mg HS  -Delirium             -Continue Keflex 500 mg Q 8 hours for UTI for 4 more days   - Follow-up with PCP to ensure resolution of UTI  - Other medical              - Prinvil 10 mg QD for HTN             - restarted Flomax 0.4mg  for BPH  He was able to engage in safety planning [facility] or contact [facility] services if he feels unable to maintain his own safety or the safety of others. Intends to avoid THC. Pt had no  further questions, comments, or concerns.   Discharge 10/06/2017 into care of family.   Mariel Craft, MD 10/06/2017, 8:37 AM

## 2017-10-06 NOTE — Progress Notes (Signed)
Recreation Therapy Notes  Date: 7.19.19 Time: 1000 Location: 500 Hall Dayroom  Group Topic: Communication, Team Building, Problem Solving  Goal Area(s) Addresses:  Patient will effectively work with peer towards shared goal.  Patient will identify skill used to make activity successful.  Patient will identify how skills used during activity can be used to reach post d/c goals.   Intervention: STEM Activity   Activity: Pipe Cleaner Tower. In teams, patients were asked to build the tallest freestanding tower possible out of 15 pipe cleaners. Systematically resources were removed, for example patient ability to use both hands and patient ability to verbally communicate.    Education: Social Skills, Discharge Planning.   Education Outcome: Acknowledges education/In group clarification offered/Needs additional education.   Clinical Observations/Feedback:  Pt did not attend group.     Ophia Shamoon, LRT/CTRS         Charmon Thorson A 10/06/2017 12:20 PM 

## 2017-10-06 NOTE — Progress Notes (Signed)
Pt discharged to lobby. Pt was stable and appreciative at that time. All papers and prescriptions were given and valuables returned. Verbal understanding expressed. Denies SI/HI and A/VH. Pt given opportunity to express concerns and ask questions.  

## 2017-10-06 NOTE — Progress Notes (Signed)
Recreation Therapy Notes  INPATIENT RECREATION TR PLAN  Patient Details Name: Walter Holder MRN: [id number] DOB: Feb 28, 1956 Today's Date: 10/06/2017  Rec Therapy Plan Is patient appropriate for Therapeutic Recreation?: Yes Treatment times per week: about 3 days Estimated Length of Stay: 5-7 days TR Treatment/Interventions: Group participation (Comment)  Discharge Criteria Pt will be discharged from therapy if:: Discharged Treatment plan/goals/alternatives discussed and agreed upon by:: Patient/family  Discharge Summary Short term goals set: See patient care plan Short term goals met: Not met Progress toward goals comments: Groups attended Which groups?: Anger management Reason goals not met: Patient attended one group Therapeutic equipment acquired: N/A Reason patient discharged from therapy: Discharge from hospital Pt/family agrees with progress & goals achieved: Yes Date patient discharged from therapy: 10/06/17    Victorino Sparrow, LRT/CTRS   Ria Comment, Shamone Winzer A 10/06/2017, 10:59 AM

## 2017-10-06 NOTE — Progress Notes (Signed)
  [facility] Discharge Plan :  Will you be returning to the same living situation after discharge:  Yes,  home At discharge, do you have transportation home?: Yes,  wife Do you have the ability to pay for your medications: Yes,  insurance  Release of information consent forms completed and in the chart;  Patient's signature needed at discharge.  Patient to Follow up at: Follow-up Information    Walter LorBarr, Julie, NP Follow up on 10/16/2017.   Specialty:  Nurse Practitioner Why:  Monday at 9:30 with Dr Walter Holder for your hospital follow up appointment. Contact information: [facility] [address] [place] [id number] [phone]           Next level of care provider has access to [facility] Link:no  Safety Planning and Suicide Prevention discussed: Yes,  yes  Have you used any form of tobacco in the last 30 days? (Cigarettes, Smokeless Tobacco, Cigars, and/or Pipes): No  Has patient been referred to the Quitline?: N/A patient is not a smoker  Patient has been referred for addiction treatment: Pt. refused referral  Walter RogueRodney B Donetta Isaza, LCSW 10/06/2017, 1:26 PM

## 2017-10-09 ENCOUNTER — Telehealth: Payer: Self-pay | Admitting: *Deleted

## 2017-10-09 NOTE — Telephone Encounter (Signed)
Called, spoke with wife. R/s appt with Dr. Terrace ArabiaYan 10/25/17 to Dr. Anne HahnWillis 10/10/17 for earlier appt. Wife verbalized understanding and will let kids know.

## 2017-10-10 ENCOUNTER — Other Ambulatory Visit: Payer: Self-pay

## 2017-10-10 ENCOUNTER — Telehealth: Payer: Self-pay | Admitting: Neurology

## 2017-10-10 ENCOUNTER — Ambulatory Visit (INDEPENDENT_AMBULATORY_CARE_PROVIDER_SITE_OTHER): Payer: Medicare HMO | Admitting: Neurology

## 2017-10-10 ENCOUNTER — Encounter: Payer: Self-pay | Admitting: Neurology

## 2017-10-10 VITALS — BP 136/82 | HR 102 | Ht 67.0 in | Wt 205.5 lb

## 2017-10-10 DIAGNOSIS — G2581 Restless legs syndrome: Secondary | ICD-10-CM

## 2017-10-10 DIAGNOSIS — R569 Unspecified convulsions: Secondary | ICD-10-CM

## 2017-10-10 HISTORY — DX: Unspecified convulsions: R56.9

## 2017-10-10 HISTORY — DX: Restless legs syndrome: G25.81

## 2017-10-10 MED ORDER — PRAMIPEXOLE DIHYDROCHLORIDE 0.125 MG PO TABS: 0.1250 mg | ORAL_TABLET | Freq: Every day | ORAL | 3 refills | Status: AC

## 2017-10-10 NOTE — Patient Instructions (Addendum)
We will get MRI of the brain and get an EEG study.  Start mirapex at night for the restless legs.  Mirapex (pramipexole) may result in confusion or hallucinations, dizziness, drowsiness, or nausea. If any significant side effects are noted, please contact our office.  No driving for 6 months following the last seizure.

## 2017-10-10 NOTE — Telephone Encounter (Signed)
[employer] medicare order sent to [facility]. They obtain the auth and will reach out to the pt to schedule.  °

## 2017-10-10 NOTE — Progress Notes (Signed)
Reason for visit: Seizure  Referring physician: [facility]  Walter Holder is a 62 y.o. male  History of present illness:  Walter Holder is a 62 year old right-handed white male with a history of a seizure event that occurred on 24 September 2017.  The patient required psychiatric hospitalization on 10 July for psychosis.  CT scan of the brain was done and was unremarkable.  The patient did not have an EEG evaluation.  On initial presentation after the seizure, an alcohol level and a urine drug screen was not done.  For the psychiatric admission, the urine drug screen was done and was negative.  The patient was felt to have a seizure on the basis of withdrawal from alprazolam, he apparently was taking 2 mg 3 times daily previously.  The patient himself however claims that he had stopped the alprazolam for several weeks prior to the hospitalization.  He claims that he was using THC Gummies, but the urine drug screen was negative for THC.  The patient has had psychosis in the past associated with stress and depression.  He is on disability for his chronic low back pain.  The patient denies a family history of seizures, he denies a history of head trauma.  He has not had any focal numbness or weakness of the face, arms, legs.  He does report some blurred vision on Seroquel.  He is still not sleeping well, his mind will race at night and he has restless leg syndrome.  The patient denies any balance issues or difficulty controlling the bowels or the bladder.  The patient comes in with a family member who witnessed the seizure, he was sitting in a truck at the time and had stiffening, tongue biting, no loss of control the bowels or the bladder.  The entire event lasted about 9 minutes, he was confused afterwards.  The patient himself woke up in an ambulance, he had no warning of the seizure event.  This is his first seizure.  Past Medical History:  Diagnosis Date  . Anginal pain (HCC) ~ 2007  . Arthritis    "might be getting some" (02/28/2012)  . C. difficile diarrhea ~ 2008; 02/28/2012  . Chronic lower back pain   . Complication of anesthesia    "my ears rang before I went out" (02/28/2012)  . GERD (gastroesophageal reflux disease)   . RLS (restless legs syndrome) 10/10/2017  . Seizures (HCC) 10/10/2017  . Stress at home    "from my daughter mostly" (02/28/2012)    Past Surgical History:  Procedure Laterality Date  . CARDIAC CATHETERIZATION  ~ 2007  . MULTIPLE TOOTH EXTRACTIONS  1990's   "3" (02/28/2012)    Family History  Problem Relation Age of Onset  . Alzheimer's disease Mother   . Diabetes Mellitus II Neg Hx     Social history:  reports that he has never smoked. He has never used smokeless tobacco. He reports that he drank alcohol. He reports that he has current or past drug history. Drugs: Marijuana, LSD, Benzodiazepines, and Cocaine.  Medications:  Prior to Admission medications   Medication Sig Start Date End Date Taking? Authorizing Provider  DULoxetine (CYMBALTA) 30 MG capsule Take 30 mg by mouth at bedtime.   Yes [provider]  DULoxetine (CYMBALTA) 30 MG capsule Take 1 capsule (30 mg total) by mouth at bedtime. For depression 10/06/17  Yes Armandina Stammer I, NP  hydrOXYzine (ATARAX/VISTARIL) 25 MG tablet Take 1 tablet (25 mg) by mouth Q  6 hours as needed: For anxiety 10/06/17  Yes Nwoko, Nicole KindredAgnes I, NP  lisinopril (PRINIVIL,ZESTRIL) 10 MG tablet Take 1 tablet (10 mg total) by mouth daily. For high blood pressure 10/06/17  Yes Nwoko, Agnes I, NP  Melatonin 3 MG TABS Take 3-9 mg by mouth at bedtime as needed (SLEEP).   Yes [provider]  QUEtiapine (SEROQUEL) 100 MG tablet Take 1 tablet (100 mg total) by mouth at bedtime. For mood control 10/06/17  Yes Armandina StammerNwoko, Agnes I, NP  tamsulosin (FLOMAX) 0.4 MG CAPS capsule Take 1 capsule (0.4 mg total) by mouth daily after supper. For prostate health 10/06/17  Yes Armandina StammerNwoko, Agnes I, NP  vitamin B-12 (CYANOCOBALAMIN) 1000 MCG  tablet Take 1,000 mcg by mouth daily.   Yes [provider]  pramipexole (MIRAPEX) 0.125 MG tablet Take 1 tablet (0.125 mg total) by mouth at bedtime. 10/10/17   York SpanielWillis, Lavayah Vita K, MD     No Known Allergies  ROS:  Out of a complete 14 system review of symptoms, the patient complains only of the following symptoms, and all other reviewed systems are negative.  Weight loss, fatigue Ringing in the ears Blurred vision Diarrhea Increased thirst Joint pain, muscle cramps in the legs, aching muscles Memory loss, confusion, numbness, weakness, seizure, tremor Anxiety, not enough sleep, change in appetite, disinterest in activities, racing thoughts Sleepiness, restless legs  Blood pressure 136/82, pulse (!) 102, height 5\' 7"  (1.702 m), weight 205 lb 8 oz (93.2 kg).  Physical Exam  General: The patient is alert and cooperative at the time of the examination.  Affect is somewhat flat.  Eyes: Pupils are equal, round, and reactive to light. Discs are flat bilaterally.  Neck: The neck is supple, no carotid bruits are noted.  Respiratory: The respiratory examination is clear.  Cardiovascular: The cardiovascular examination reveals a regular rate and rhythm, no obvious murmurs or rubs are noted.  Skin: Extremities are without significant edema.  Neurologic Exam  Mental status: The patient is alert and oriented x 3 at the time of the examination. The patient has apparent normal recent and remote memory, with an apparently normal attention span and concentration ability.  Cranial nerves: Facial symmetry is present. There is good sensation of the face to pinprick and soft touch bilaterally. The strength of the facial muscles and the muscles to head turning and shoulder shrug are normal bilaterally. Speech is well enunciated, no aphasia or dysarthria is noted. Extraocular movements are full. Visual fields are full. The tongue is midline, and the patient has symmetric elevation of the soft  palate. No obvious hearing deficits are noted.  Motor: The motor testing reveals 5 over 5 strength of all 4 extremities. Good symmetric motor tone is noted throughout.  Sensory: Sensory testing is intact to pinprick, soft touch, vibration sensation, and position sense on all 4 extremities. No evidence of extinction is noted.  Coordination: Cerebellar testing reveals good finger-nose-finger and heel-to-shin bilaterally.  A fine action tremor seen in the hands bilaterally at times.  Gait and station: Gait is normal. Tandem gait is normal. Romberg is negative. No drift is seen.  Reflexes: Deep tendon reflexes are symmetric and normal bilaterally. Toes are downgoing bilaterally.   CT head 09/27/17:  IMPRESSION: 1. No acute intracranial process. 2. Absent septum pellucidum, otherwise negative non-contrast CT HEAD for age.   Assessment/Plan:  1.  New onset seizure  2.  Restless leg syndrome  There is some question of alprazolam withdrawal, but the patient indicates that he  was not on alprazolam for many weeks prior to the onset of the seizure.  For this reason, a full work-up will be undertaken, MRI of the brain with and without gadolinium will be done.  An EEG study was ordered.  The patient does not operate a motor vehicle for 6 months following the last seizure.  He will follow-up in 6 months, sooner if needed.  He will call me if a recurrent seizure is noted.  He will be placed on low-dose Mirapex for his restless leg syndrome, he will stop the medication he believes that it increases to his racing thoughts.  He is not sleeping well in part secondary to restless leg syndrome.  Marlan Palau MD 10/10/2017 11:09 AM  [facility] [address] [place], [place] [address]  Phone [phone] Fax [phone]

## 2017-10-11 ENCOUNTER — Telehealth: Payer: Self-pay | Admitting: Neurology

## 2017-10-11 NOTE — Telephone Encounter (Signed)
I called wife back. Advised Dr. Anne HahnWillis saw husband yesterday and rx'd mirapex for RLS. Otherwise, we are not prescribing other medications. He should request from PCP for medication refills. She states Jena GaussJulie Barr,NP is who follows husband. When she tried calling last time, she states NP didn't know who her husband is. I recommended if she runs into this issue again to contact their insurance to see who is in-network with them for PCP options to get established with a PCP.  Reminded her of EEG appt on 10/16/17 at 1:30pm.  She verbalized understanding and appreciation for call.

## 2017-10-11 NOTE — Telephone Encounter (Signed)
Pt wife-on DPR(Thompson,Rosemarie @[phone]) is asking if Dr Anne HahnWillis will fill all of pt's medications to   [facility] [address] [phone] (Phone) [phone] (Fax)     Wife is asking if there is a way for Dr Anne HahnWillis to evaluate medications and if he see's a way of getting any of the medications at a lower rate to please do so.  Wife states last time with previous dr the medications came to $157.00 which is a lot on their finances.  Pt wife is asking for a call

## 2017-10-16 ENCOUNTER — Ambulatory Visit: Payer: Medicare HMO | Admitting: Neurology

## 2017-10-16 ENCOUNTER — Telehealth: Payer: Self-pay | Admitting: Neurology

## 2017-10-16 DIAGNOSIS — R569 Unspecified convulsions: Secondary | ICD-10-CM | POA: Diagnosis not present

## 2017-10-16 NOTE — Procedures (Signed)
    History:  Walter CabalDavid Holder is a 62 year old gentleman with a history of a seizure event that occurred on 24 September 2017.  There has been some question whether or not this was related to alprazolam withdrawal.  The patient has not had recurrence since that initial event.  He is being evaluated for the seizure.  This is a routine EEG.  No skull defects are noted.  Medications include Cymbalta, hydroxyzine, lisinopril, Seroquel, Flomax, and Mirapex.  EEG classification: Normal awake  Description of the recording: The background rhythms of this recording consists of a fairly well modulated medium amplitude alpha rhythm of 10 Hz that is reactive to eye opening and closure. As the record progresses, the patient appears to remain in the waking state throughout the recording. Photic stimulation was performed, resulting in a bilateral and symmetric photic driving response. Hyperventilation was also performed, resulting in a minimal buildup of the background rhythm activities without significant slowing seen. At no time during the recording does there appear to be evidence of spike or spike wave discharges or evidence of focal slowing. EKG monitor shows no evidence of cardiac rhythm abnormalities with a heart rate of 102.  Impression: This is a normal EEG recording in the waking state. No evidence of ictal or interictal discharges are seen.

## 2017-10-16 NOTE — Telephone Encounter (Signed)
I called the patient. The EEG study done was normal.  MRI of the brain is pending.

## 2017-10-19 DIAGNOSIS — R569 Unspecified convulsions: Secondary | ICD-10-CM | POA: Diagnosis not present

## 2017-10-19 DIAGNOSIS — Z6830 Body mass index (BMI) 30.0-30.9, adult: Secondary | ICD-10-CM | POA: Diagnosis not present

## 2017-10-19 DIAGNOSIS — M545 Low back pain: Secondary | ICD-10-CM | POA: Diagnosis not present

## 2017-10-19 DIAGNOSIS — R69 Illness, unspecified: Secondary | ICD-10-CM | POA: Diagnosis not present

## 2017-10-19 DIAGNOSIS — G4709 Other insomnia: Secondary | ICD-10-CM | POA: Diagnosis not present

## 2017-10-20 ENCOUNTER — Ambulatory Visit
Admission: RE | Admit: 2017-10-20 | Discharge: 2017-10-20 | Disposition: A | Discharge status: Home / Self Care | Payer: Medicare HMO | Source: Ambulatory Visit | Attending: Neurology | Admitting: Neurology

## 2017-10-20 DIAGNOSIS — R569 Unspecified convulsions: Secondary | ICD-10-CM | POA: Diagnosis not present

## 2017-10-20 MED ORDER — GADOBENATE DIMEGLUMINE 529 MG/ML IV SOLN
20.0000 mL | Freq: Once | INTRAVENOUS | Status: AC | PRN
  Administered 2017-10-20: 20 mL via INTRAVENOUS

## 2017-10-22 ENCOUNTER — Telehealth: Payer: Self-pay | Admitting: Neurology

## 2017-10-22 NOTE — Telephone Encounter (Signed)
I called the patient.  MRI of the brain was unremarkable.  We will watch the patient, if he has another seizure, he is to contact our office.   MRI brain 10/20/17:  IMPRESSION: This MRI of the brain with and without contrast shows the following:  1.   There is absence of the septum pellucidum associated mild generalized cortical atrophy and mild corpus callosal atrophy.  No other midline defect is noted.  The brain is otherwise normal.   2.   There is a normal enhancement pattern and there are no acute findings.

## 2017-10-23 NOTE — Telephone Encounter (Signed)
[employer] Medicare Berkley Harveyauth: [id number]: [id number] (exp. 10/19/17 to 01/17/18) pt had MRI on 10/20/17 at [facility].

## 2017-10-25 ENCOUNTER — Encounter

## 2017-10-25 ENCOUNTER — Ambulatory Visit: Payer: Self-pay | Admitting: Neurology

## 2018-04-17 NOTE — Progress Notes (Deleted)
[facility]  PATIENT: Walter Holder DOB: [birth year]   REASON FOR VISIT: Follow-up for seizure disorder HISTORY FROM:    HISTORY OF PRESENT ILLNESS: 7/23/19KWMr. Holder is a 63 year old right-handed white male with a history of a seizure event that occurred on 24 September 2017.  The patient required psychiatric hospitalization on 10 July for psychosis.  CT scan of the brain was done and was unremarkable.  The patient did not have an EEG evaluation.  On initial presentation after the seizure, an alcohol level and a urine drug screen was not done.  For the psychiatric admission, the urine drug screen was done and was negative.  The patient was felt to have a seizure on the basis of withdrawal from alprazolam, he apparently was taking 2 mg 3 times daily previously.  The patient himself however claims that he had stopped the alprazolam for several weeks prior to the hospitalization.  He claims that he was using THC Gummies, but the urine drug screen was negative for THC.  The patient has had psychosis in the past associated with stress and depression.  He is on disability for his chronic low back pain.  The patient denies a family history of seizures, he denies a history of head trauma.  He has not had any focal numbness or weakness of the face, arms, legs.  He does report some blurred vision on Seroquel.  He is still not sleeping well, his mind will race at night and he has restless leg syndrome.  The patient denies any balance issues or difficulty controlling the bowels or the bladder.  The patient comes in with a family member who witnessed the seizure, he was sitting in a truck at the time and had stiffening, tongue biting, no loss of control the bowels or the bladder.  The entire event lasted about 9 minutes, he was confused afterwards.  The patient himself woke up in an ambulance, he had no warning of the seizure event.  This is his first seizure  REVIEW OF SYSTEMS: Full 14 system  review of systems performed and notable only for those listed, all others are neg:  Constitutional: neg  Cardiovascular: neg Ear/Nose/Throat: neg  Skin: neg Eyes: neg Respiratory: neg Gastroitestinal: neg  Hematology/Lymphatic: neg  Endocrine: neg Musculoskeletal:neg Allergy/Immunology: neg Neurological: neg Psychiatric: neg Sleep : neg   ALLERGIES: No Known Allergies  HOME MEDICATIONS: Outpatient Medications Prior to Visit  Medication Sig Dispense Refill  . DULoxetine (CYMBALTA) 30 MG capsule Take 30 mg by mouth at bedtime.    . DULoxetine (CYMBALTA) 30 MG capsule Take 1 capsule (30 mg total) by mouth at bedtime. For depression 30 capsule 0  . hydrOXYzine (ATARAX/VISTARIL) 25 MG tablet Take 1 tablet (25 mg) by mouth Q 6 hours as needed: For anxiety 60 tablet 0  . lisinopril (PRINIVIL,ZESTRIL) 10 MG tablet Take 1 tablet (10 mg total) by mouth daily. For high blood pressure 10 tablet 0  . Melatonin 3 MG TABS Take 3-9 mg by mouth at bedtime as needed (SLEEP).    . pramipexole (MIRAPEX) 0.125 MG tablet Take 1 tablet (0.125 mg total) by mouth at bedtime. 30 tablet 3  . QUEtiapine (SEROQUEL) 100 MG tablet Take 1 tablet (100 mg total) by mouth at bedtime. For mood control 30 tablet 0  . tamsulosin (FLOMAX) 0.4 MG CAPS capsule Take 1 capsule (0.4 mg total) by mouth daily after supper. For prostate health 5 capsule 0  . vitamin B-12 (CYANOCOBALAMIN) 1000 MCG tablet Take  1,000 mcg by mouth daily.     No facility-administered medications prior to visit.     PAST MEDICAL HISTORY: Past Medical History:  Diagnosis Date  . Anginal pain (HCC) ~ 2007  . Arthritis    "might be getting some" (02/28/2012)  . C. difficile diarrhea ~ 2008; 02/28/2012  . Chronic lower back pain   . Complication of anesthesia    "my ears rang before I went out" (02/28/2012)  . GERD (gastroesophageal reflux disease)   . RLS (restless legs syndrome) 10/10/2017  . Seizures (HCC) 10/10/2017  . Stress at home     "from my daughter mostly" (02/28/2012)    PAST SURGICAL HISTORY: Past Surgical History:  Procedure Laterality Date  . CARDIAC CATHETERIZATION  ~ 2007  . MULTIPLE TOOTH EXTRACTIONS  1990's   "3" (02/28/2012)    FAMILY HISTORY: Family History  Problem Relation Age of Onset  . Alzheimer's disease Mother   . Diabetes Mellitus II Neg Hx     SOCIAL HISTORY: Social History   Socioeconomic History  . Marital status: Married    Spouse name: Not on file  . Number of children: Not on file  . Years of education: Not on file  . Highest education level: Not on file  Occupational History  . Not on file  Social Needs  . Financial resource strain: Not on file  . Food insecurity:    Worry: Not on file    Inability: Not on file  . Transportation needs:    Medical: Not on file    Non-medical: Not on file  Tobacco Use  . Smoking status: Never Smoker  . Smokeless tobacco: Never Used  Substance and Sexual Activity  . Alcohol use: Not Currently    Comment: 02/28/2012 "5-6 beers on the weekends" 08/22/14 none.  . Drug use: Yes    Types: Marijuana, LSD, Benzodiazepines, Cocaine    Comment: 02/28/2012 "back in the 1970's"   . Sexual activity: Yes  Lifestyle  . Physical activity:    Days per week: Not on file    Minutes per session: Not on file  . Stress: Not on file  Relationships  . Social connections:    Talks on phone: Not on file    Gets together: Not on file    Attends religious service: Not on file    Active member of club or organization: Not on file    Attends meetings of clubs or organizations: Not on file    Relationship status: Not on file  . Intimate partner violence:    Fear of current or ex partner: Not on file    Emotionally abused: Not on file    Physically abused: Not on file    Forced sexual activity: Not on file  Other Topics Concern  . Not on file  Social History Narrative   Lives with wife and son   Caffeine use: Some    Right handed      PHYSICAL  EXAM  There were no vitals filed for this visit. There is no height or weight on file to calculate BMI.  Generalized: Well developed, in no acute distress  Head: normocephalic and atraumatic,. Oropharynx benign  Neck: Supple, no carotid bruits  Cardiac: Regular rate rhythm, no murmur  Musculoskeletal: No deformity   Neurological examination   Mentation: Alert oriented to time, place, history taking. Attention span and concentration appropriate. Recent and remote memory intact.  Follows all commands speech and language fluent.   Cranial nerve II-XII:  Fundoscopic exam reveals sharp disc margins.Pupils were equal round reactive to light extraocular movements were full, visual field were full on confrontational test. Facial sensation and strength were normal. hearing was intact to finger rubbing bilaterally. Uvula tongue midline. head turning and shoulder shrug were normal and symmetric.Tongue protrusion into cheek strength was normal. Motor: normal bulk and tone, full strength in the BUE, BLE, fine finger movements normal, no pronator drift. No focal weakness Sensory: normal and symmetric to light touch, pinprick, and  Vibration, proprioception  Coordination: finger-nose-finger, heel-to-shin bilaterally, no dysmetria Reflexes: Brachioradialis 2/2, biceps 2/2, triceps 2/2, patellar 2/2, Achilles 2/2, plantar responses were flexor bilaterally. Gait and Station: Rising up from seated position without assistance, normal stance,  moderate stride, good arm swing, smooth turning, able to perform tiptoe, and heel walking without difficulty. Tandem gait is steady  DIAGNOSTIC DATA (LABS, IMAGING, TESTING) - I reviewed patient records, labs, notes, testing and imaging myself where available.  Lab Results  Component Value Date   WBC 9.3 10/02/2017   HGB 15.1 10/02/2017   HCT 44.4 10/02/2017   MCV 86.9 10/02/2017   PLT 264 10/02/2017      Component Value Date/Time   NA 139 10/02/2017 1730   K 3.5  10/02/2017 1730   CL 104 10/02/2017 1730   CO2 24 10/02/2017 1730   GLUCOSE 150 (H) 10/02/2017 1730   BUN 11 10/02/2017 1730   CREATININE 0.74 10/02/2017 1730   CALCIUM 9.1 10/02/2017 1730   PROT 7.6 10/02/2017 1730   ALBUMIN 3.8 10/02/2017 1730   AST 18 10/02/2017 1730   ALT 26 10/02/2017 1730   ALKPHOS 66 10/02/2017 1730   BILITOT 0.8 10/02/2017 1730   GFRNONAA >60 10/02/2017 1730   GFRAA >60 10/02/2017 1730   Lab Results  Component Value Date   CHOL 187 04/01/2014   HDL 42 04/01/2014   LDLCALC 100 (H) 04/01/2014   TRIG 225 (H) 04/01/2014   CHOLHDL 4.5 04/01/2014   Lab Results  Component Value Date   HGBA1C 5.4 04/01/2014   No results found for: VITAMINB12 Lab Results  Component Value Date   TSH 0.806 04/01/2014    ***  ASSESSMENT AND PLAN  63 y.o. year old male  has a past medical history of Anginal pain (HCC) (~ 2007), Arthritis, C. difficile diarrhea (~ 2008; 02/28/2012), Chronic lower back pain, Complication of anesthesia, GERD (gastroesophageal reflux disease), RLS (restless legs syndrome) (10/10/2017), Seizures (HCC) (10/10/2017), and Stress at home. here with ***  New onset seizure  2.  Restless leg syndrome  There is some question of alprazolam withdrawal, but the patient indicates that he was not on alprazolam for many weeks prior to the onset of the seizure.  For this reason, a full work-up will be undertaken, MRI of the brain with and without gadolinium will be done.  An EEG study was ordered.  The patient does not operate a motor vehicle for 6 months following the last seizure.  He will follow-up in 6 months, sooner if needed.  He will call me if a recurrent seizure is noted.  He will be placed on low-dose Mirapex for his restless leg syndrome, he will stop the medication he believes that it increases to his racing thoughts.  He is not sleeping well in part secondary to restless leg syndrome.  Nilda RiggsNancy Carolyn Martin, [facility] LLCGNP, [facility], APRN  [facility] [address], [address] [place], [place] [address] [phone]

## 2018-04-18 ENCOUNTER — Ambulatory Visit: Payer: Medicare HMO | Admitting: Nurse Practitioner

## 2018-04-18 ENCOUNTER — Telehealth: Payer: Self-pay | Admitting: *Deleted

## 2018-04-18 NOTE — Telephone Encounter (Signed)
Patient was no show for follow up with NP today.  

## 2020-01-09 IMAGING — CT CT HEAD W/O CM
4 series · 15 of 47 positions shown, 17 images · non-contrast
Comparison: 03/28/2014

CLINICAL DATA: Seizure, new, nontraumatic

EXAM:
CT HEAD WITHOUT CONTRAST
TECHNIQUE: Contiguous axial images were obtained from the base of the skull
through the vertex without intravenous contrast.

[Series 3: head wo · axial · 0.43mm/px · z∈[-142,-22]mm · 7 of 33 slices shown, 9 images]
[im 5/33  brain]
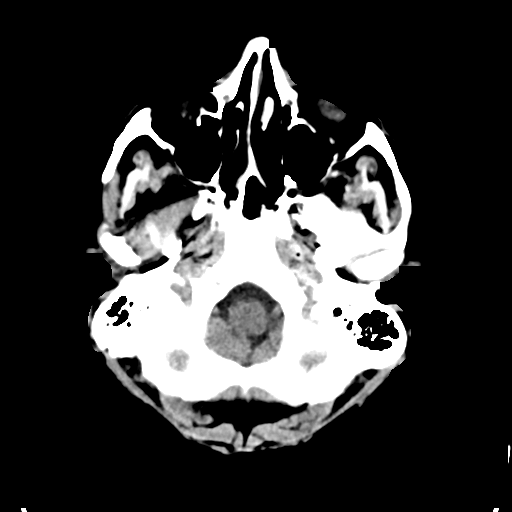
[im 5/33  bone]
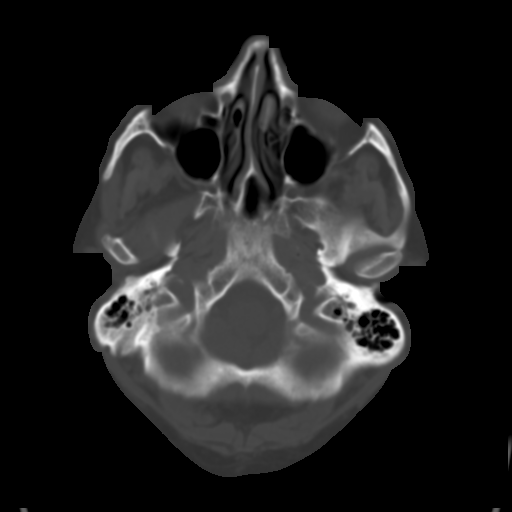
[im 9/33  brain]
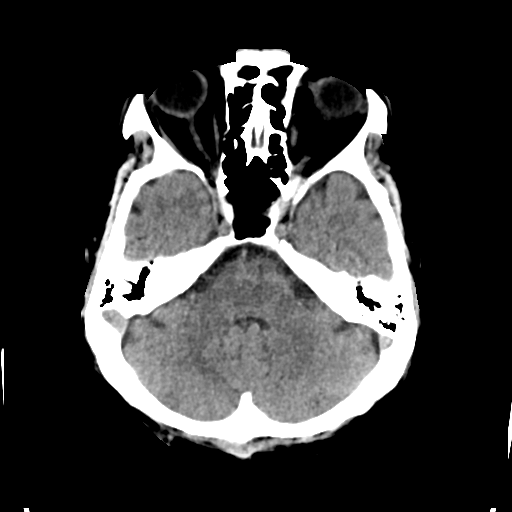
[im 13/33  brain]
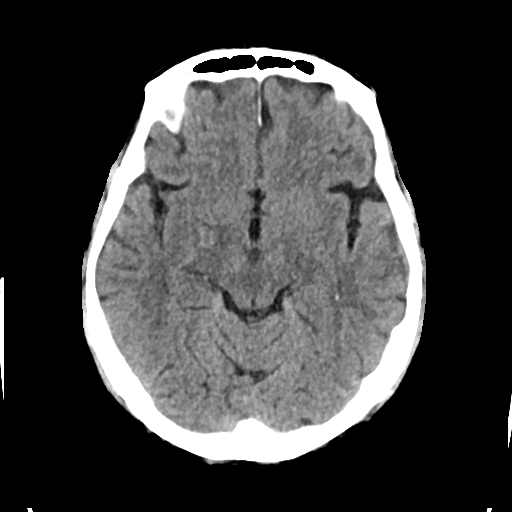
[im 17/33  brain]
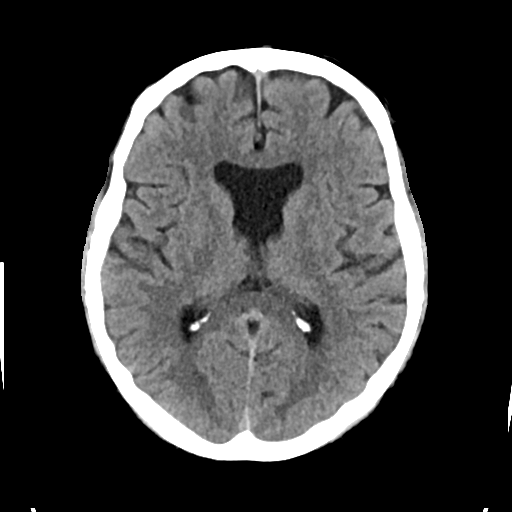
[im 21/33  brain]
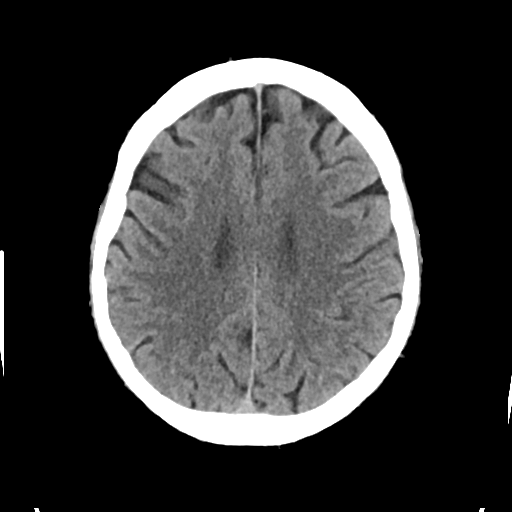
[im 21/33  bone]
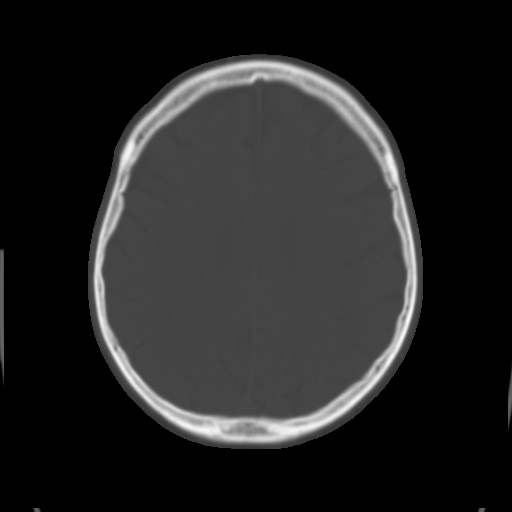
[im 25/33  brain]
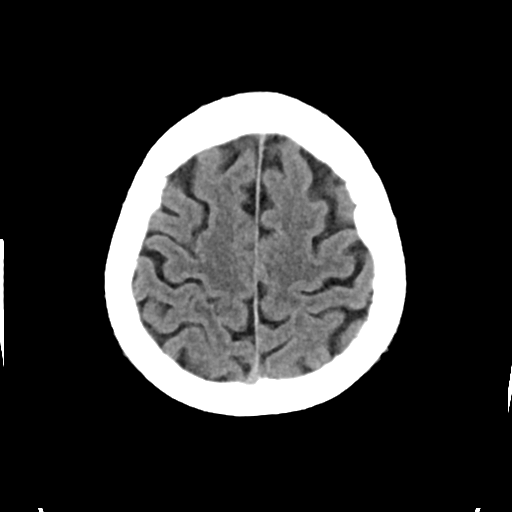
[im 29/33  brain]
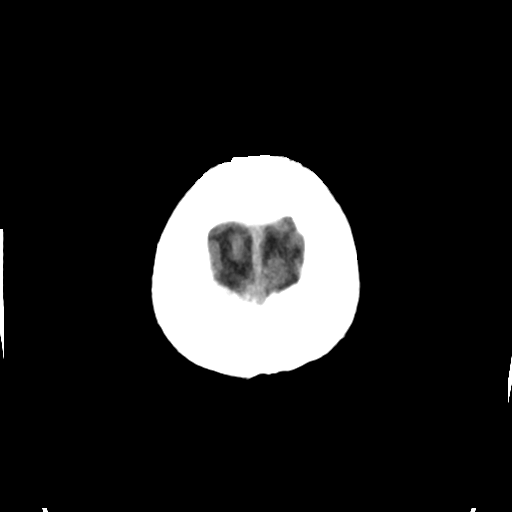

[Series 4: head bone · axial · 0.43mm/px · z∈[-146,-130]mm · 2 of 82 slices shown]
[im 9/82  bone]
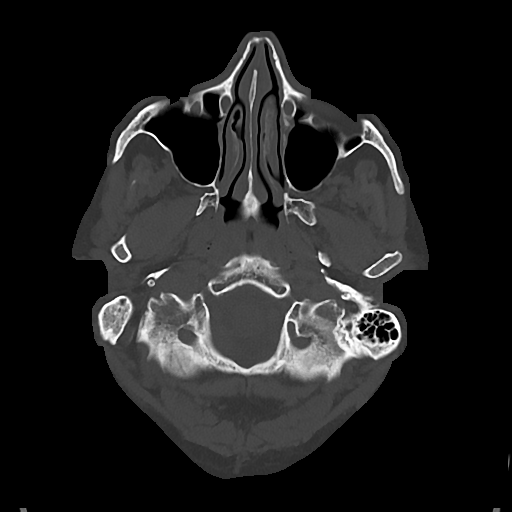
[im 17/82  bone]
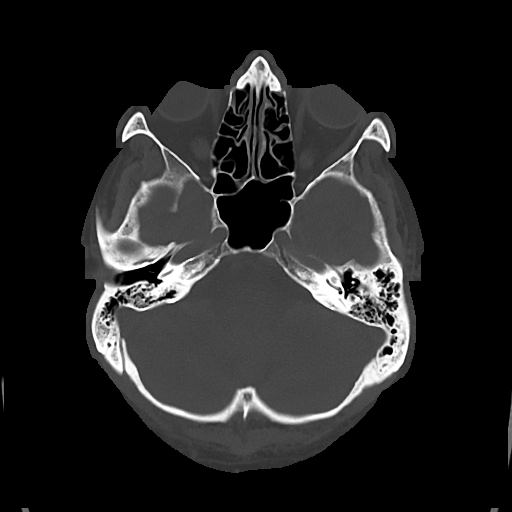

[Series 5: cor soft · coronal · 0.32mm/px · 3 of 60 slices shown]
[im 20/60  brain]
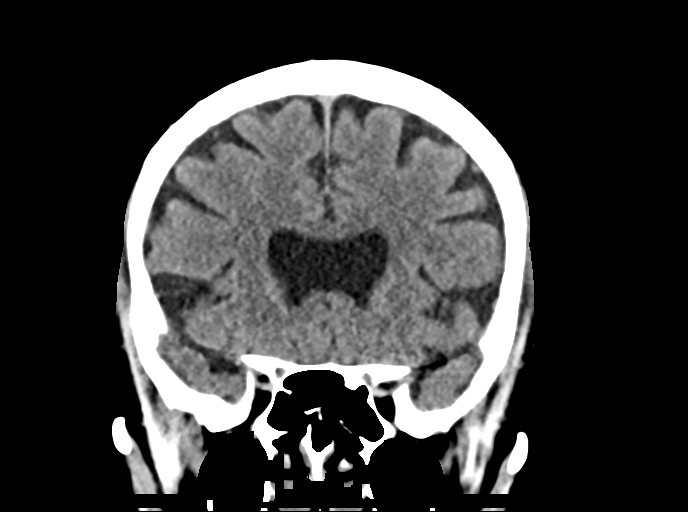
[im 27/60  brain]
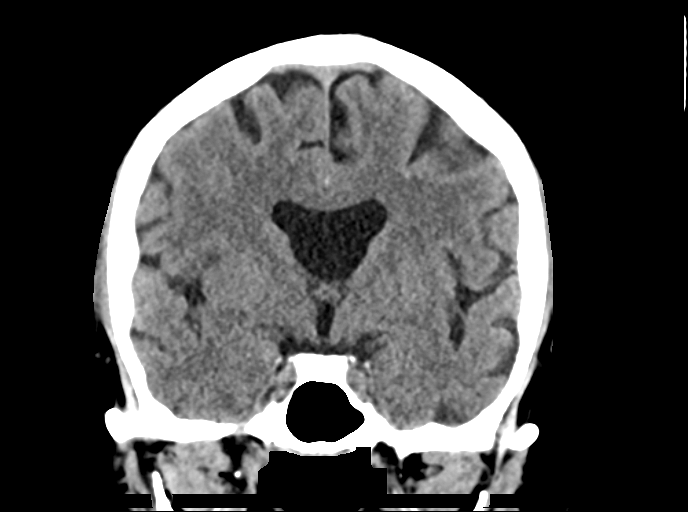
[im 33/60  brain]
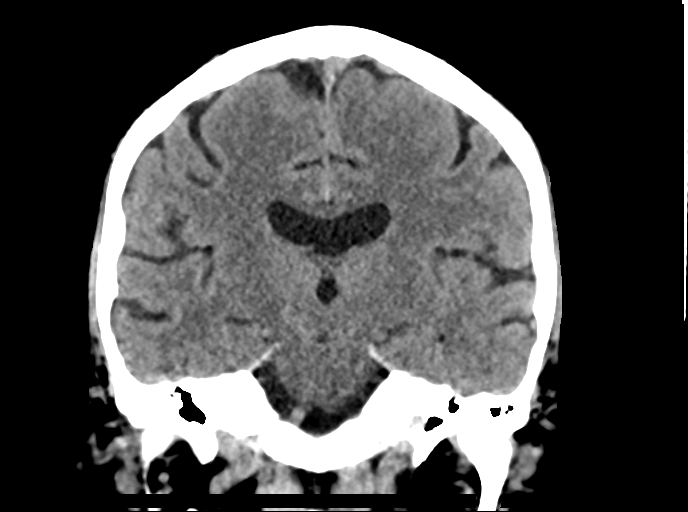

[Series 6: sag soft · sagittal · 0.32mm/px · 3 of 53 slices shown]
[im 18/53  brain]
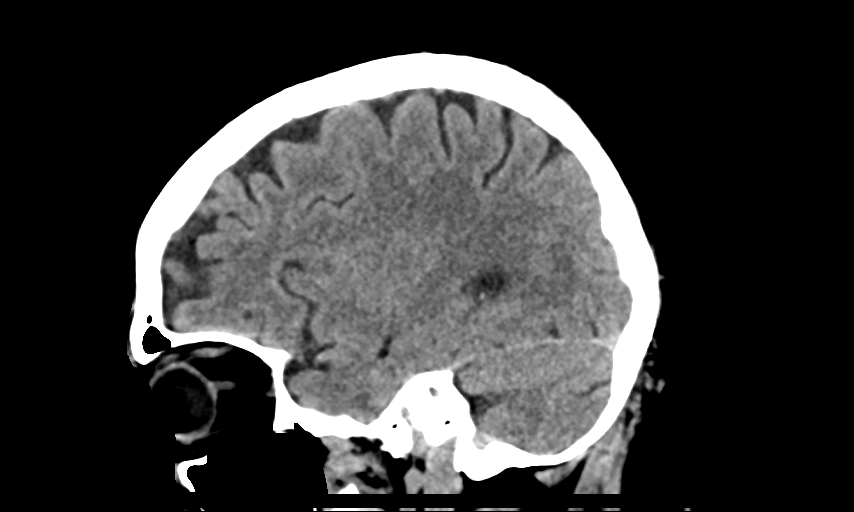
[im 27/53  brain]
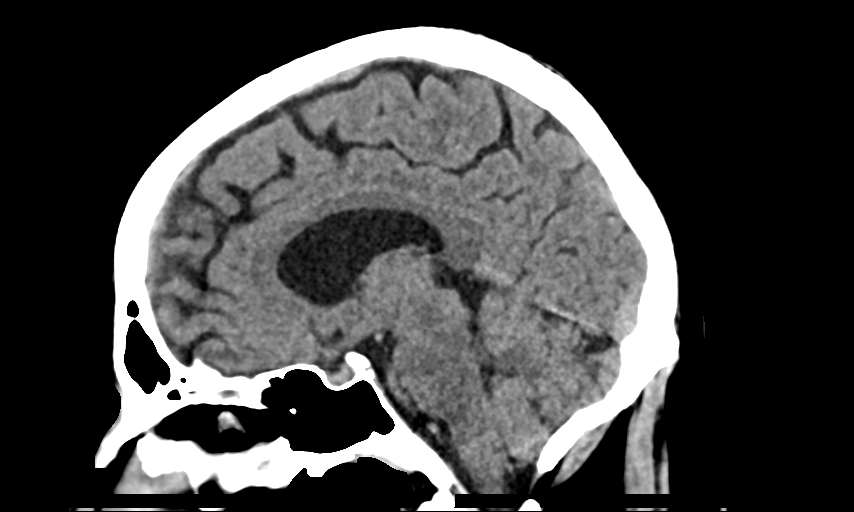
[im 35/53  brain]
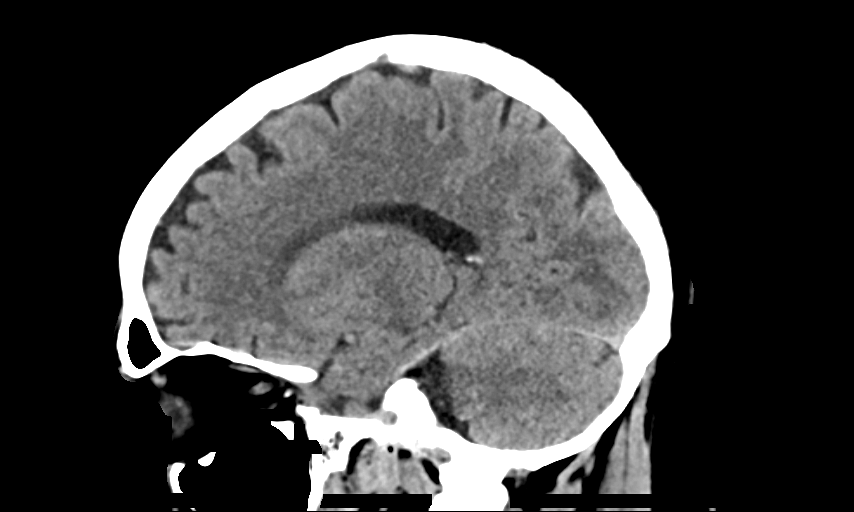

[15 of 47 positions shown; findings below may reference images not displayed]

FINDINGS: Brain: No evidence of infarction, hemorrhage, hydrocephalus,
extra-axial collection or mass lesion/mass effect. Absent septum
pellucidum.

Vascular: No hyperdense vessel or unexpected calcification.

Skull: Normal. Negative for fracture or focal lesion.

Sinuses/Orbits: Negative
IMPRESSION: Negative head CT.

## 2021-07-22 DIAGNOSIS — M9904 Segmental and somatic dysfunction of sacral region: Secondary | ICD-10-CM | POA: Diagnosis not present

## 2021-07-22 DIAGNOSIS — M9903 Segmental and somatic dysfunction of lumbar region: Secondary | ICD-10-CM | POA: Diagnosis not present

## 2021-07-22 DIAGNOSIS — M9905 Segmental and somatic dysfunction of pelvic region: Secondary | ICD-10-CM | POA: Diagnosis not present

## 2021-07-22 DIAGNOSIS — M5136 Other intervertebral disc degeneration, lumbar region: Secondary | ICD-10-CM | POA: Diagnosis not present

## 2022-01-07 DIAGNOSIS — Z1331 Encounter for screening for depression: Secondary | ICD-10-CM | POA: Diagnosis not present

## 2022-01-07 DIAGNOSIS — Z1339 Encounter for screening examination for other mental health and behavioral disorders: Secondary | ICD-10-CM | POA: Diagnosis not present

## 2022-01-11 ENCOUNTER — Ambulatory Visit (HOSPITAL_COMMUNITY)
Admission: RE | Admit: 2022-01-11 | Discharge: 2022-01-11 | Disposition: A | Discharge status: Home / Self Care | Payer: Medicare HMO | Attending: Psychiatry | Admitting: Psychiatry

## 2022-01-11 DIAGNOSIS — Z636 Dependent relative needing care at home: Secondary | ICD-10-CM

## 2022-01-11 NOTE — H&P (Signed)
Behavioral Health Medical Screening Exam  Walter Holder is a 66 y.o. male.  With a history of major depressive disorder, seizure disorder, and lower back pain.  Patient presented to [facility] accompanied by his niece.  According to the niece Walter Holder), the patient has been declining for the past 2 weeks cognitively.  According to the niece the patient takes care of his wife who is very dependent on him, when asked what does she mean by cognitive decline in the niece stated that the patient would stop responding to them in the middle of a sentence he would walk away without saying a word he becomes irritable at times and angry.  According to the niece the patient lives at home with his wife, patient does have biological children who lives outside of the home.  Patient is currently not seeing a psychiatrist or therapist.  Patient could not answer as to what medicine he was taking at this time. Per the niece Walter Holder) she has her hand full because she is also taking care of the patient grandchild because their parent is currently been investigated by DSS.    According to the niece she is asking if we can get the patient to social worker who can help the patient because they need somebody to be be at the house to take care of the wife.  NP discussed with patient and the niece that this is a Research officer, trade union of social work's and that they might need to reach out to adult protective services so that they can get some help as far as having somebody come in the home to assist the patient in taking care of his wife.  Niece does voice a concern that the patient is not able to take care of himself and his wife.  Face-to-face observation of the patient, patient is alert and oriented to person and place time.  Mood is anxious affect is flat and he has to repeat each question when asking a question of the patient.  Patient seemed to be zoned out when asked a question and so the question has to be repeated.  Patient denies SI, HI,  AVH or paranoia.  NP spoke with social worker Mikey Kirschner and gave her the niece number with consent of the niece Walter Holder),  so that they can be steered in the right direction to get help.    Copied notes from [place]. ok she was just able to come me back because she was driving. She reports she's not concerned with him living with his wife but in the near future she reports he may not be able to care for his wife due to her being immobile and etc. She reports since talking with you she talked with another relative and they report his cognition does that but she reports no major concerns regarding safety and or caring for him/his wife. I explored options with her about having a home health aid come in to assist with ADL's, cooking, clean, and giving him a few hours break per day. I will e-mail her resources for [place] to get in contact with a care coordinator and information on how to get a CNA for assistance. I will also provide information to dss if needed. she reports zayvien goes fishing with them, different places, and etc. so I felt no need for APS report since she reports no concerns related to safety and she reported no concerns with him caring for her currently. I will also send  information on how to contact the disability advocate for [place] to provide assistance   Recommend discharge for them to follow-up with primary care physician and they may need a referral to neurology for for further assessment.  Total Time spent with patient: 30 minutes  Psychiatric Specialty Exam:  Presentation  General Appearance: No data recorded Eye Contact:No data recorded Speech:No data recorded Speech Volume:No data recorded Handedness:No data recorded  Mood and Affect  Mood:No data recorded Affect:No data recorded  Thought Process  Thought Processes:No data recorded Descriptions of Associations:No data recorded Orientation:No data recorded Thought Content:No data  recorded History of Schizophrenia/Schizoaffective disorder:No data recorded Duration of Psychotic Symptoms:No data recorded Hallucinations:No data recorded Ideas of Reference:No data recorded Suicidal Thoughts:No data recorded Homicidal Thoughts:No data recorded  Sensorium  Memory:No data recorded Judgment:No data recorded Insight:No data recorded  Executive Functions  Concentration:No data recorded Attention Span:No data recorded Recall:No data recorded Fund of Knowledge:No data recorded Language:No data recorded  Psychomotor Activity  Psychomotor Activity:No data recorded  Assets  Assets:No data recorded  Sleep  Sleep:No data recorded   Physical Exam: Physical Exam Vitals reviewed.  HENT:     Head: Normocephalic.     Nose: Nose normal.  Cardiovascular:     Rate and Rhythm: Tachycardia present.  Pulmonary:     Effort: Pulmonary effort is normal.  Musculoskeletal:        General: Normal range of motion.     Cervical back: Normal range of motion.  Skin:    General: Skin is warm.  Neurological:     General: No focal deficit present.  Psychiatric:        Mood and Affect: Mood normal.        Behavior: Behavior normal.    Review of Systems  Constitutional: Negative.   HENT: Negative.    Eyes: Negative.   Respiratory: Negative.    Cardiovascular: Negative.   Gastrointestinal: Negative.   Genitourinary: Negative.   Musculoskeletal: Negative.   Skin: Negative.   Neurological: Negative.   Endo/Heme/Allergies: Negative.   Psychiatric/Behavioral:  The patient is nervous/anxious.    Blood pressure (!) 130/98, pulse (!) 109, temperature 99 F (37.2 C), temperature source Oral, SpO2 99 %. There is no height or weight on file to calculate BMI.  Musculoskeletal: Strength & Muscle Tone: within normal limits Gait & Station: normal Patient leans: N/A  [place] Scale:  Flowsheet Row OP Visit from 01/11/2022 in [facility] to  Hosp-Admission (Discharged) from 09/27/2017 in [facility]  C-SSRS RISK CATEGORY No Risk No Risk       Recommendations:  Based on my evaluation the patient appears to have an emergency medical condition for which I recommend the patient be transferred to the emergency department for further evaluation.  Evette Georges, NP 01/11/2022, 8:40 PM

## 2022-01-12 DIAGNOSIS — Z636 Dependent relative needing care at home: Secondary | ICD-10-CM | POA: Insufficient documentation

## 2022-01-12 DIAGNOSIS — E785 Hyperlipidemia, unspecified: Secondary | ICD-10-CM | POA: Diagnosis not present

## 2022-01-12 DIAGNOSIS — Z125 Encounter for screening for malignant neoplasm of prostate: Secondary | ICD-10-CM | POA: Diagnosis not present

## 2022-01-12 DIAGNOSIS — R7989 Other specified abnormal findings of blood chemistry: Secondary | ICD-10-CM | POA: Diagnosis not present

## 2022-01-12 DIAGNOSIS — I10 Essential (primary) hypertension: Secondary | ICD-10-CM | POA: Diagnosis not present

## 2022-01-15 ENCOUNTER — Emergency Department (HOSPITAL_COMMUNITY)
Admission: EM | Admit: 2022-01-15 | Discharge: 2022-01-16 | Discharge status: Against Medical Advice | Payer: Medicare HMO | Attending: Emergency Medicine | Admitting: Emergency Medicine

## 2022-01-15 ENCOUNTER — Ambulatory Visit (HOSPITAL_COMMUNITY)
Admission: EM | Admit: 2022-01-15 | Discharge: 2022-01-15 | Disposition: A | Discharge status: Other Acute Inpatient Hospital | Payer: Medicare HMO | Attending: Internal Medicine | Admitting: Internal Medicine

## 2022-01-15 ENCOUNTER — Other Ambulatory Visit: Payer: Self-pay

## 2022-01-15 ENCOUNTER — Emergency Department (HOSPITAL_COMMUNITY): Payer: Medicare HMO

## 2022-01-15 ENCOUNTER — Encounter (HOSPITAL_COMMUNITY): Payer: Self-pay | Admitting: Emergency Medicine

## 2022-01-15 DIAGNOSIS — R4182 Altered mental status, unspecified: Secondary | ICD-10-CM

## 2022-01-15 DIAGNOSIS — M549 Dorsalgia, unspecified: Secondary | ICD-10-CM | POA: Diagnosis not present

## 2022-01-15 DIAGNOSIS — Z5321 Procedure and treatment not carried out due to patient leaving prior to being seen by health care provider: Secondary | ICD-10-CM | POA: Diagnosis not present

## 2022-01-15 LAB — URINALYSIS, ROUTINE W REFLEX MICROSCOPIC
Bilirubin Urine: NEGATIVE
Glucose, UA: NEGATIVE mg/dL
Ketones, ur: NEGATIVE mg/dL
Nitrite: POSITIVE — AB
Protein, ur: NEGATIVE mg/dL
Specific Gravity, Urine: 1.023 (ref 1.005–1.030)
pH: 5 (ref 5.0–8.0)

## 2022-01-15 LAB — CBC WITH DIFFERENTIAL/PLATELET
Abs Immature Granulocytes: 0.03 10*3/uL (ref 0.00–0.07)
Basophils Absolute: 0.1 10*3/uL (ref 0.0–0.1)
Basophils Relative: 1 %
Eosinophils Absolute: 0.1 10*3/uL (ref 0.0–0.5)
Eosinophils Relative: 1 %
HCT: 39.5 % (ref 39.0–52.0)
Hemoglobin: 13.2 g/dL (ref 13.0–17.0)
Immature Granulocytes: 0 %
Lymphocytes Relative: 21 %
Lymphs Abs: 2.3 10*3/uL (ref 0.7–4.0)
MCH: 29 pg (ref 26.0–34.0)
MCHC: 33.4 g/dL (ref 30.0–36.0)
MCV: 86.8 fL (ref 80.0–100.0)
Monocytes Absolute: 1 10*3/uL (ref 0.1–1.0)
Monocytes Relative: 9 %
Neutro Abs: 7.6 10*3/uL (ref 1.7–7.7)
Neutrophils Relative %: 68 %
Platelets: 282 10*3/uL (ref 150–400)
RBC: 4.55 MIL/uL (ref 4.22–5.81)
RDW: 14.1 % (ref 11.5–15.5)
WBC: 11 10*3/uL — ABNORMAL HIGH (ref 4.0–10.5)
nRBC: 0 % (ref 0.0–0.2)

## 2022-01-15 LAB — COMPREHENSIVE METABOLIC PANEL
ALT: 29 U/L (ref 0–44)
AST: 28 U/L (ref 15–41)
Albumin: 4 g/dL (ref 3.5–5.0)
Alkaline Phosphatase: 64 U/L (ref 38–126)
Anion gap: 10 (ref 5–15)
BUN: 16 mg/dL (ref 8–23)
CO2: 24 mmol/L (ref 22–32)
Calcium: 9.5 mg/dL (ref 8.9–10.3)
Chloride: 105 mmol/L (ref 98–111)
Creatinine, Ser: 0.91 mg/dL (ref 0.61–1.24)
GFR, Estimated: >60 mL/min (ref >60)
Glucose, Bld: 101 mg/dL — ABNORMAL HIGH (ref 70–99)
Potassium: 3.2 mmol/L — ABNORMAL LOW (ref 3.5–5.1)
Sodium: 139 mmol/L (ref 135–145)
Total Bilirubin: 0.6 mg/dL (ref 0.3–1.2)
Total Protein: 7.1 g/dL (ref 6.5–8.1)

## 2022-01-15 LAB — RAPID URINE DRUG SCREEN, HOSP PERFORMED
Amphetamines: NOT DETECTED
Barbiturates: NOT DETECTED
Benzodiazepines: NOT DETECTED
Cocaine: NOT DETECTED
Opiates: NOT DETECTED
Tetrahydrocannabinol: NOT DETECTED

## 2022-01-15 LAB — AMMONIA: Ammonia: 11 umol/L (ref 9–35)

## 2022-01-15 LAB — ETHANOL: Alcohol, Ethyl (B): <10 mg/dL (ref <10)

## 2022-01-15 LAB — CBG MONITORING, ED: Glucose-Capillary: 108 mg/dL — ABNORMAL HIGH (ref 70–99)

## 2022-01-15 NOTE — ED Provider Triage Note (Signed)
Emergency Medicine Provider Triage Evaluation Note  Walter Holder , a 66 y.o. male  was evaluated in triage.  Pt complains of altered mental status for the past few days, PCP wanted a urine sample- has had similar cogitative problems previously related to UTI/stone 4-5 years ago.  Here with son who provides history. Patient wandered 3 miles from home a few days ago.  Went to UC today and wandered out of UC.  Pt c/o back pain, son states he stopped his pain meds a few days ago, not drinking well.  Scheduled for neuro eval in 2019 for dementia but did not go to apt.  Review of Systems  Positive:  Negative:   Physical Exam  There were no vitals taken for this visit. Gen:   Awake, no distress   Resp:  Normal effort  MSK:   Moves extremities without difficulty  Other:  Alert to date 2023, son- Walter Holder, location- hospital   Medical Decision Making  Medically screening exam initiated at 2:45 PM.  Appropriate orders placed.  Walter Holder was informed that the remainder of the evaluation will be completed by another provider, this initial triage assessment does not replace that evaluation, and the importance of remaining in the ED until their evaluation is complete.     Tacy Learn, PA-C 01/15/22 1449

## 2022-01-15 NOTE — ED Notes (Signed)
Pt son states he is taking his father home because they have been waiting too long

## 2022-01-15 NOTE — ED Notes (Signed)
[facility] arrived. 

## 2022-01-15 NOTE — ED Triage Notes (Addendum)
Family reports last seen normal was 5 days.  Pt not talking. Pt hands shaking. Son with PT . Son reports an ongoing changes. Son reports the family called him to come from [address] to to [place] because of change in behavior. Pt had same episode fer son approx 5 years ago.Pt's son reports this episode may have been on going for 3 weeks. Provider in room to eval Pt

## 2022-01-15 NOTE — ED Triage Notes (Signed)
Patient as a transfer from [facility] for altered mental status that seems to have been going for 3 weeks. Patient not able to answer the year per [facility].

## 2022-01-17 DIAGNOSIS — E785 Hyperlipidemia, unspecified: Secondary | ICD-10-CM | POA: Diagnosis not present

## 2022-01-17 DIAGNOSIS — D72829 Elevated white blood cell count, unspecified: Secondary | ICD-10-CM | POA: Diagnosis not present

## 2022-01-17 DIAGNOSIS — G4700 Insomnia, unspecified: Secondary | ICD-10-CM | POA: Diagnosis not present

## 2022-01-17 DIAGNOSIS — F3341 Major depressive disorder, recurrent, in partial remission: Secondary | ICD-10-CM | POA: Diagnosis not present

## 2022-01-17 DIAGNOSIS — I10 Essential (primary) hypertension: Secondary | ICD-10-CM | POA: Diagnosis not present

## 2022-01-17 DIAGNOSIS — M9903 Segmental and somatic dysfunction of lumbar region: Secondary | ICD-10-CM | POA: Diagnosis not present

## 2022-01-17 DIAGNOSIS — E669 Obesity, unspecified: Secondary | ICD-10-CM | POA: Diagnosis not present

## 2022-01-17 DIAGNOSIS — F419 Anxiety disorder, unspecified: Secondary | ICD-10-CM | POA: Diagnosis not present

## 2022-01-17 DIAGNOSIS — Z0000 Encounter for general adult medical examination without abnormal findings: Secondary | ICD-10-CM | POA: Diagnosis not present

## 2022-01-18 NOTE — ED Provider Notes (Signed)
Patient presents urgent care with his son for evaluation of altered mental status for the last 5 days.  Patient lives by himself and son came into town this morning to find his dad slightly more confused than normal and slightly more agitated as well.  Patient has had a few episodes within the last week where he will wander off on foot and become agitated with family for attempting to redirect him.  No recent falls, alcohol use, drug use, or seizure activity.  Denies urinary symptoms, fever/chills, viral URI symptoms, decreased oral intake, and dizziness.  Patient denies chest pain, nausea, vomiting, diarrhea, constipation, abdominal pain, shortness of breath, and weakness.  Unclear as to how long patient has been altered as son does not live in town but he frequently communicates with other family members who are closer to patient who have reported that patient has been altered/more confused than normal for 5 days.    Patient is alert and oriented x4 at this time with a nonfocal neurologic exam in triage.  There is no evidence of trauma to the head or neck.  Blood pressure is elevated at 172/127.  He denies acute pain.  Due to altered mental status, intermittent agitation, and elevated blood pressure without history of hypertension, recommend patient go to the nearest emergency department for further evaluation and urgent blood work to rule out acute cause of altered mental status.  [facility] called to transport patient to the ER and patient and son verbalized understanding and agreement with this plan.  Discussed risks of deferring emergency department visit at this time to which they each expressed understanding and agreement with plan.  Patient discharged from urgent care with [facility] in stable condition.   Talbot Grumbling, [place] 01/18/22 1436

## 2022-06-27 DIAGNOSIS — H9201 Otalgia, right ear: Secondary | ICD-10-CM | POA: Diagnosis not present

## 2022-06-27 DIAGNOSIS — H6121 Impacted cerumen, right ear: Secondary | ICD-10-CM | POA: Diagnosis not present

## 2023-02-21 DIAGNOSIS — Z125 Encounter for screening for malignant neoplasm of prostate: Secondary | ICD-10-CM | POA: Diagnosis not present

## 2023-02-21 DIAGNOSIS — Z1212 Encounter for screening for malignant neoplasm of rectum: Secondary | ICD-10-CM | POA: Diagnosis not present

## 2023-02-21 DIAGNOSIS — R82998 Other abnormal findings in urine: Secondary | ICD-10-CM | POA: Diagnosis not present

## 2023-02-21 DIAGNOSIS — I10 Essential (primary) hypertension: Secondary | ICD-10-CM | POA: Diagnosis not present

## 2023-02-21 DIAGNOSIS — E785 Hyperlipidemia, unspecified: Secondary | ICD-10-CM | POA: Diagnosis not present

## 2023-02-28 DIAGNOSIS — E785 Hyperlipidemia, unspecified: Secondary | ICD-10-CM | POA: Diagnosis not present

## 2023-02-28 DIAGNOSIS — F4321 Adjustment disorder with depressed mood: Secondary | ICD-10-CM | POA: Diagnosis not present

## 2023-02-28 DIAGNOSIS — M25512 Pain in left shoulder: Secondary | ICD-10-CM | POA: Diagnosis not present

## 2023-02-28 DIAGNOSIS — G4700 Insomnia, unspecified: Secondary | ICD-10-CM | POA: Diagnosis not present

## 2023-02-28 DIAGNOSIS — Z0000 Encounter for general adult medical examination without abnormal findings: Secondary | ICD-10-CM | POA: Diagnosis not present

## 2023-02-28 DIAGNOSIS — M5416 Radiculopathy, lumbar region: Secondary | ICD-10-CM | POA: Diagnosis not present

## 2023-02-28 DIAGNOSIS — R8271 Bacteriuria: Secondary | ICD-10-CM | POA: Diagnosis not present

## 2023-02-28 DIAGNOSIS — F419 Anxiety disorder, unspecified: Secondary | ICD-10-CM | POA: Diagnosis not present

## 2023-02-28 DIAGNOSIS — I10 Essential (primary) hypertension: Secondary | ICD-10-CM | POA: Diagnosis not present
